# Patient Record
Sex: Male | Born: 1945
Health system: Southern US, Community
[De-identification: ages and names within clinical notes are randomized; demographics above are authoritative.]

## PROBLEM LIST (undated history)

## (undated) DIAGNOSIS — I2510 Atherosclerotic heart disease of native coronary artery without angina pectoris: Secondary | ICD-10-CM

## (undated) DIAGNOSIS — E785 Hyperlipidemia, unspecified: Secondary | ICD-10-CM

## (undated) DIAGNOSIS — G4730 Sleep apnea, unspecified: Secondary | ICD-10-CM

## (undated) DIAGNOSIS — F419 Anxiety disorder, unspecified: Secondary | ICD-10-CM

## (undated) DIAGNOSIS — I714 Abdominal aortic aneurysm, without rupture: Secondary | ICD-10-CM

## (undated) DIAGNOSIS — M1990 Unspecified osteoarthritis, unspecified site: Secondary | ICD-10-CM

## (undated) DIAGNOSIS — E669 Obesity, unspecified: Secondary | ICD-10-CM

## (undated) DIAGNOSIS — I252 Old myocardial infarction: Secondary | ICD-10-CM

## (undated) DIAGNOSIS — I739 Peripheral vascular disease, unspecified: Secondary | ICD-10-CM

## (undated) DIAGNOSIS — IMO0002 Reserved for concepts with insufficient information to code with codable children: Secondary | ICD-10-CM

## (undated) DIAGNOSIS — N529 Male erectile dysfunction, unspecified: Secondary | ICD-10-CM

## (undated) DIAGNOSIS — I10 Essential (primary) hypertension: Secondary | ICD-10-CM

## (undated) DIAGNOSIS — K219 Gastro-esophageal reflux disease without esophagitis: Secondary | ICD-10-CM

## (undated) DIAGNOSIS — R739 Hyperglycemia, unspecified: Secondary | ICD-10-CM

## (undated) DIAGNOSIS — R351 Nocturia: Secondary | ICD-10-CM

## (undated) HISTORY — DX: Hyperglycemia, unspecified: R73.9

## (undated) HISTORY — DX: Male erectile dysfunction, unspecified: N52.9

## (undated) HISTORY — DX: Anxiety disorder, unspecified: F41.9

## (undated) HISTORY — DX: Obesity, unspecified: E66.9

## (undated) HISTORY — PX: TONSILLECTOMY: SUR1361

## (undated) HISTORY — PX: CORONARY ARTERY BYPASS GRAFT: SHX141

## (undated) HISTORY — DX: Reserved for concepts with insufficient information to code with codable children: IMO0002

## (undated) HISTORY — PX: COLONOSCOPY: SHX174

## (undated) HISTORY — DX: Nocturia: R35.1

## (undated) HISTORY — DX: Essential (primary) hypertension: I10

## (undated) HISTORY — DX: Hyperlipidemia, unspecified: E78.5

## (undated) HISTORY — PX: UPPER GASTROINTESTINAL ENDOSCOPY: SHX188

## (undated) HISTORY — DX: Unspecified osteoarthritis, unspecified site: M19.90

## (undated) HISTORY — DX: Sleep apnea, unspecified: G47.30

## (undated) HISTORY — DX: Gastro-esophageal reflux disease without esophagitis: K21.9

## (undated) HISTORY — DX: Atherosclerotic heart disease of native coronary artery without angina pectoris: I25.10

## (undated) HISTORY — DX: Old myocardial infarction: I25.2

## (undated) HISTORY — DX: Peripheral vascular disease, unspecified: I73.9

## (undated) HISTORY — DX: Abdominal aortic aneurysm, without rupture: I71.4

## (undated) HISTORY — PX: OTHER SURGICAL HISTORY: SHX169

---

## 1998-11-11 ENCOUNTER — Ambulatory Visit (HOSPITAL_COMMUNITY): Admission: RE | Admit: 1998-11-11 | Discharge: 1998-11-11 | Payer: Self-pay | Admitting: Gastroenterology

## 2000-01-19 ENCOUNTER — Encounter (INDEPENDENT_AMBULATORY_CARE_PROVIDER_SITE_OTHER): Payer: Self-pay

## 2000-01-19 ENCOUNTER — Ambulatory Visit (HOSPITAL_COMMUNITY): Admission: RE | Admit: 2000-01-19 | Discharge: 2000-01-19 | Payer: Self-pay | Admitting: Gastroenterology

## 2000-10-19 ENCOUNTER — Ambulatory Visit (HOSPITAL_COMMUNITY): Admission: RE | Admit: 2000-10-19 | Discharge: 2000-10-19 | Payer: Self-pay | Admitting: Cardiology

## 2002-01-23 ENCOUNTER — Encounter (INDEPENDENT_AMBULATORY_CARE_PROVIDER_SITE_OTHER): Payer: Self-pay | Admitting: Specialist

## 2002-01-23 ENCOUNTER — Ambulatory Visit (HOSPITAL_COMMUNITY): Admission: RE | Admit: 2002-01-23 | Discharge: 2002-01-23 | Payer: Self-pay | Admitting: Gastroenterology

## 2004-05-20 ENCOUNTER — Ambulatory Visit: Payer: Self-pay | Admitting: Internal Medicine

## 2004-05-27 ENCOUNTER — Ambulatory Visit: Payer: Self-pay | Admitting: Internal Medicine

## 2004-06-04 ENCOUNTER — Encounter: Admission: RE | Admit: 2004-06-04 | Discharge: 2004-06-04 | Payer: Self-pay | Admitting: Internal Medicine

## 2004-06-11 ENCOUNTER — Ambulatory Visit: Payer: Self-pay

## 2004-07-07 ENCOUNTER — Ambulatory Visit: Payer: Self-pay | Admitting: *Deleted

## 2004-07-15 ENCOUNTER — Ambulatory Visit: Payer: Self-pay

## 2004-09-23 ENCOUNTER — Ambulatory Visit: Payer: Self-pay | Admitting: *Deleted

## 2005-05-13 ENCOUNTER — Ambulatory Visit: Payer: Self-pay | Admitting: Internal Medicine

## 2005-05-19 ENCOUNTER — Ambulatory Visit: Payer: Self-pay | Admitting: Endocrinology

## 2005-05-31 ENCOUNTER — Ambulatory Visit (HOSPITAL_COMMUNITY): Admission: RE | Admit: 2005-05-31 | Discharge: 2005-05-31 | Payer: Self-pay | Admitting: Internal Medicine

## 2005-05-31 ENCOUNTER — Ambulatory Visit: Payer: Self-pay | Admitting: Internal Medicine

## 2005-06-10 ENCOUNTER — Ambulatory Visit (HOSPITAL_COMMUNITY): Admission: RE | Admit: 2005-06-10 | Discharge: 2005-06-10 | Payer: Self-pay | Admitting: Internal Medicine

## 2005-12-10 ENCOUNTER — Ambulatory Visit: Payer: Self-pay | Admitting: Internal Medicine

## 2006-02-03 ENCOUNTER — Ambulatory Visit: Payer: Self-pay | Admitting: Internal Medicine

## 2006-10-20 ENCOUNTER — Ambulatory Visit: Payer: Self-pay | Admitting: *Deleted

## 2006-10-20 LAB — CONVERTED CEMR LAB
ALT: 66 units/L — ABNORMAL HIGH (ref 0–40)
Alkaline Phosphatase: 59 units/L (ref 39–117)
Basophils Relative: 0.2 % (ref 0.0–1.0)
Bilirubin, Direct: 0.1 mg/dL (ref 0.0–0.3)
CO2: 31 meq/L (ref 19–32)
Calcium: 8.8 mg/dL (ref 8.4–10.5)
Creatinine, Ser: 1 mg/dL (ref 0.4–1.5)
Eosinophils Relative: 4.5 % (ref 0.0–5.0)
GFR calc Af Amer: 97 mL/min
Glucose, Bld: 108 mg/dL — ABNORMAL HIGH (ref 70–99)
Hemoglobin: 15.4 g/dL (ref 13.0–17.0)
LDL Cholesterol: 62 mg/dL (ref 0–99)
Lymphocytes Relative: 45.5 % (ref 12.0–46.0)
Monocytes Absolute: 0.4 10*3/uL (ref 0.2–0.7)
Neutro Abs: 1.3 10*3/uL — ABNORMAL LOW (ref 1.4–7.7)
Platelets: 99 10*3/uL — ABNORMAL LOW (ref 150–400)
RDW: 12.6 % (ref 11.5–14.6)
Total Bilirubin: 0.8 mg/dL (ref 0.3–1.2)
Total Protein: 6.4 g/dL (ref 6.0–8.3)
VLDL: 13 mg/dL (ref 0–40)
WBC: 3.3 10*3/uL — ABNORMAL LOW (ref 4.5–10.5)

## 2006-10-25 ENCOUNTER — Ambulatory Visit: Payer: Self-pay | Admitting: *Deleted

## 2007-01-04 ENCOUNTER — Ambulatory Visit: Payer: Self-pay | Admitting: *Deleted

## 2007-01-04 ENCOUNTER — Ambulatory Visit: Payer: Self-pay

## 2007-01-04 LAB — CONVERTED CEMR LAB
ALT: 39 units/L (ref 0–53)
AST: 37 units/L (ref 0–37)
Albumin: 4.1 g/dL (ref 3.5–5.2)
Alkaline Phosphatase: 68 units/L (ref 39–117)
Total Bilirubin: 0.8 mg/dL (ref 0.3–1.2)
Triglycerides: 85 mg/dL (ref 0–149)
VLDL: 17 mg/dL (ref 0–40)

## 2007-01-11 ENCOUNTER — Ambulatory Visit: Payer: Self-pay

## 2007-01-12 ENCOUNTER — Ambulatory Visit: Payer: Self-pay | Admitting: Internal Medicine

## 2007-01-25 ENCOUNTER — Ambulatory Visit: Payer: Self-pay | Admitting: Internal Medicine

## 2007-01-27 ENCOUNTER — Ambulatory Visit: Payer: Self-pay

## 2007-01-27 ENCOUNTER — Ambulatory Visit: Payer: Self-pay | Admitting: Cardiovascular Disease

## 2007-04-26 ENCOUNTER — Ambulatory Visit: Payer: Self-pay | Admitting: Cardiology

## 2007-08-24 ENCOUNTER — Ambulatory Visit: Payer: Self-pay | Admitting: Cardiovascular Disease

## 2007-10-25 ENCOUNTER — Encounter: Payer: Self-pay | Admitting: Internal Medicine

## 2007-12-29 ENCOUNTER — Ambulatory Visit (HOSPITAL_COMMUNITY): Admission: RE | Admit: 2007-12-29 | Discharge: 2007-12-29 | Payer: Self-pay | Admitting: Internal Medicine

## 2008-01-02 ENCOUNTER — Ambulatory Visit: Payer: Self-pay

## 2008-05-08 ENCOUNTER — Ambulatory Visit: Payer: Self-pay | Admitting: Cardiology

## 2008-05-08 LAB — CONVERTED CEMR LAB
AST: 34 units/L (ref 0–37)
Albumin: 4 g/dL (ref 3.5–5.2)
Alkaline Phosphatase: 69 units/L (ref 39–117)
BUN: 18 mg/dL (ref 6–23)
Basophils Absolute: 0 10*3/uL (ref 0.0–0.1)
Chloride: 106 meq/L (ref 96–112)
Eosinophils Absolute: 0.3 10*3/uL (ref 0.0–0.7)
Eosinophils Relative: 3.5 % (ref 0.0–5.0)
GFR calc Af Amer: 87 mL/min
GFR calc non Af Amer: 72 mL/min
HCT: 46.9 % (ref 39.0–52.0)
MCHC: 34.7 g/dL (ref 30.0–36.0)
MCV: 88 fL (ref 78.0–100.0)
Monocytes Absolute: 0.5 10*3/uL (ref 0.1–1.0)
Neutrophils Relative %: 56 % (ref 43.0–77.0)
Platelets: 137 10*3/uL — ABNORMAL LOW (ref 150–400)
Potassium: 4.6 meq/L (ref 3.5–5.1)
RDW: 13.3 % (ref 11.5–14.6)
Sodium: 141 meq/L (ref 135–145)
Total Bilirubin: 0.9 mg/dL (ref 0.3–1.2)
WBC: 7.7 10*3/uL (ref 4.5–10.5)

## 2008-05-16 ENCOUNTER — Ambulatory Visit: Payer: Self-pay | Admitting: Cardiology

## 2008-05-16 LAB — CONVERTED CEMR LAB
BUN: 19 mg/dL (ref 6–23)
Basophils Relative: 0.6 % (ref 0.0–3.0)
CO2: 28 meq/L (ref 19–32)
Calcium: 9.1 mg/dL (ref 8.4–10.5)
Chloride: 105 meq/L (ref 96–112)
Creatinine, Ser: 0.9 mg/dL (ref 0.4–1.5)
Eosinophils Relative: 3.5 % (ref 0.0–5.0)
Hemoglobin: 15.6 g/dL (ref 13.0–17.0)
Lymphocytes Relative: 30.6 % (ref 12.0–46.0)
MCV: 87.5 fL (ref 78.0–100.0)
Neutro Abs: 4.7 10*3/uL (ref 1.4–7.7)
Neutrophils Relative %: 57.3 % (ref 43.0–77.0)
RBC: 5.17 M/uL (ref 4.22–5.81)
WBC: 8.2 10*3/uL (ref 4.5–10.5)

## 2008-05-24 ENCOUNTER — Inpatient Hospital Stay (HOSPITAL_BASED_OUTPATIENT_CLINIC_OR_DEPARTMENT_OTHER): Admission: RE | Admit: 2008-05-24 | Discharge: 2008-05-24 | Payer: Self-pay | Admitting: Cardiology

## 2008-05-24 ENCOUNTER — Ambulatory Visit: Payer: Self-pay | Admitting: Cardiology

## 2008-06-06 ENCOUNTER — Ambulatory Visit: Payer: Self-pay | Admitting: Cardiology

## 2008-08-15 ENCOUNTER — Ambulatory Visit: Payer: Self-pay | Admitting: Cardiovascular Disease

## 2008-10-09 ENCOUNTER — Telehealth (INDEPENDENT_AMBULATORY_CARE_PROVIDER_SITE_OTHER): Payer: Self-pay | Admitting: *Deleted

## 2008-10-30 ENCOUNTER — Encounter: Payer: Self-pay | Admitting: Cardiovascular Disease

## 2008-10-30 ENCOUNTER — Encounter (INDEPENDENT_AMBULATORY_CARE_PROVIDER_SITE_OTHER): Payer: Self-pay | Admitting: *Deleted

## 2008-10-30 ENCOUNTER — Ambulatory Visit: Payer: Self-pay

## 2008-11-01 ENCOUNTER — Telehealth: Payer: Self-pay | Admitting: Internal Medicine

## 2008-11-04 ENCOUNTER — Ambulatory Visit: Payer: Self-pay | Admitting: Internal Medicine

## 2008-11-04 LAB — CONVERTED CEMR LAB
AST: 38 units/L — ABNORMAL HIGH (ref 0–37)
Albumin: 3.9 g/dL (ref 3.5–5.2)
Alkaline Phosphatase: 73 units/L (ref 39–117)
Basophils Absolute: 0 10*3/uL (ref 0.0–0.1)
Basophils Relative: 0.1 % (ref 0.0–3.0)
Calcium: 9.3 mg/dL (ref 8.4–10.5)
Eosinophils Relative: 2.4 % (ref 0.0–5.0)
GFR calc non Af Amer: 71.62 mL/min (ref >60)
HDL: 38.1 mg/dL — ABNORMAL LOW (ref >39.00)
Hemoglobin, Urine: NEGATIVE
Hemoglobin: 15.5 g/dL (ref 13.0–17.0)
LDL Cholesterol: 77 mg/dL (ref 0–99)
Lymphocytes Relative: 37.4 % (ref 12.0–46.0)
Monocytes Relative: 8 % (ref 3.0–12.0)
Neutro Abs: 3.9 10*3/uL (ref 1.4–7.7)
RBC: 5.21 M/uL (ref 4.22–5.81)
RDW: 13.6 % (ref 11.5–14.6)
Sodium: 142 meq/L (ref 135–145)
Total CHOL/HDL Ratio: 3
Total Protein, Urine: NEGATIVE mg/dL
Urine Glucose: NEGATIVE mg/dL
VLDL: 14.6 mg/dL (ref 0.0–40.0)
WBC: 7.5 10*3/uL (ref 4.5–10.5)

## 2008-11-11 ENCOUNTER — Ambulatory Visit: Payer: Self-pay | Admitting: Internal Medicine

## 2008-11-12 DIAGNOSIS — I714 Abdominal aortic aneurysm, without rupture, unspecified: Secondary | ICD-10-CM

## 2008-11-12 DIAGNOSIS — E785 Hyperlipidemia, unspecified: Secondary | ICD-10-CM

## 2008-11-12 DIAGNOSIS — R351 Nocturia: Secondary | ICD-10-CM | POA: Insufficient documentation

## 2008-11-12 DIAGNOSIS — I739 Peripheral vascular disease, unspecified: Secondary | ICD-10-CM

## 2008-11-12 DIAGNOSIS — E663 Overweight: Secondary | ICD-10-CM

## 2008-11-12 DIAGNOSIS — IMO0002 Reserved for concepts with insufficient information to code with codable children: Secondary | ICD-10-CM

## 2008-11-12 DIAGNOSIS — K219 Gastro-esophageal reflux disease without esophagitis: Secondary | ICD-10-CM

## 2008-11-12 HISTORY — DX: Abdominal aortic aneurysm, without rupture: I71.4

## 2008-11-12 HISTORY — DX: Abdominal aortic aneurysm, without rupture, unspecified: I71.40

## 2008-11-18 DIAGNOSIS — Z9089 Acquired absence of other organs: Secondary | ICD-10-CM

## 2008-11-18 DIAGNOSIS — F528 Other sexual dysfunction not due to a substance or known physiological condition: Secondary | ICD-10-CM | POA: Insufficient documentation

## 2008-11-18 DIAGNOSIS — I10 Essential (primary) hypertension: Secondary | ICD-10-CM

## 2008-11-18 DIAGNOSIS — Z951 Presence of aortocoronary bypass graft: Secondary | ICD-10-CM | POA: Insufficient documentation

## 2008-11-18 DIAGNOSIS — I252 Old myocardial infarction: Secondary | ICD-10-CM

## 2008-11-19 ENCOUNTER — Ambulatory Visit: Payer: Self-pay | Admitting: Cardiovascular Disease

## 2009-08-26 ENCOUNTER — Telehealth (INDEPENDENT_AMBULATORY_CARE_PROVIDER_SITE_OTHER): Payer: Self-pay | Admitting: *Deleted

## 2009-09-03 ENCOUNTER — Telehealth: Payer: Self-pay | Admitting: Cardiovascular Disease

## 2009-09-05 ENCOUNTER — Encounter (INDEPENDENT_AMBULATORY_CARE_PROVIDER_SITE_OTHER): Payer: Self-pay | Admitting: *Deleted

## 2009-09-12 ENCOUNTER — Encounter: Payer: Self-pay | Admitting: Cardiovascular Disease

## 2009-10-15 ENCOUNTER — Telehealth (INDEPENDENT_AMBULATORY_CARE_PROVIDER_SITE_OTHER): Payer: Self-pay | Admitting: *Deleted

## 2009-10-30 ENCOUNTER — Encounter: Payer: Self-pay | Admitting: Cardiovascular Disease

## 2009-10-31 ENCOUNTER — Ambulatory Visit: Payer: Self-pay

## 2009-10-31 ENCOUNTER — Encounter: Payer: Self-pay | Admitting: Cardiovascular Disease

## 2009-11-11 ENCOUNTER — Ambulatory Visit: Payer: Self-pay | Admitting: Cardiovascular Disease

## 2009-11-13 ENCOUNTER — Ambulatory Visit: Payer: Self-pay | Admitting: Internal Medicine

## 2009-11-13 LAB — CONVERTED CEMR LAB
Albumin: 4.4 g/dL (ref 3.5–5.2)
CO2: 29 meq/L (ref 19–32)
Eosinophils Relative: 3.1 % (ref 0.0–5.0)
Glucose, Bld: 105 mg/dL — ABNORMAL HIGH (ref 70–99)
HCT: 46.6 % (ref 39.0–52.0)
HDL: 40.8 mg/dL (ref >39.00)
Hemoglobin: 16 g/dL (ref 13.0–17.0)
LDL Cholesterol: 88 mg/dL (ref 0–99)
Lymphs Abs: 2.6 10*3/uL (ref 0.7–4.0)
Monocytes Relative: 8.1 % (ref 3.0–12.0)
Neutro Abs: 3.9 10*3/uL (ref 1.4–7.7)
PSA: 0.47 ng/mL (ref 0.10–4.00)
Potassium: 4.6 meq/L (ref 3.5–5.1)
Sodium: 140 meq/L (ref 135–145)
Total CHOL/HDL Ratio: 4
Triglycerides: 86 mg/dL (ref 0.0–149.0)
WBC: 7.3 10*3/uL (ref 4.5–10.5)

## 2009-11-20 ENCOUNTER — Telehealth (INDEPENDENT_AMBULATORY_CARE_PROVIDER_SITE_OTHER): Payer: Self-pay | Admitting: *Deleted

## 2009-11-24 ENCOUNTER — Encounter (INDEPENDENT_AMBULATORY_CARE_PROVIDER_SITE_OTHER): Payer: Self-pay | Admitting: *Deleted

## 2009-11-24 ENCOUNTER — Ambulatory Visit: Payer: Self-pay | Admitting: Cardiology

## 2009-11-24 ENCOUNTER — Encounter: Payer: Self-pay | Admitting: Cardiovascular Disease

## 2009-11-24 ENCOUNTER — Telehealth: Payer: Self-pay | Admitting: Internal Medicine

## 2009-11-24 ENCOUNTER — Encounter (HOSPITAL_COMMUNITY): Admission: RE | Admit: 2009-11-24 | Discharge: 2010-02-04 | Payer: Self-pay | Admitting: Cardiovascular Disease

## 2009-11-24 ENCOUNTER — Ambulatory Visit: Payer: Self-pay

## 2009-11-25 ENCOUNTER — Encounter: Payer: Self-pay | Admitting: Cardiology

## 2009-11-25 ENCOUNTER — Encounter: Payer: Self-pay | Admitting: Internal Medicine

## 2009-11-27 ENCOUNTER — Ambulatory Visit: Payer: Self-pay | Admitting: Cardiology

## 2009-12-02 ENCOUNTER — Encounter: Payer: Self-pay | Admitting: Internal Medicine

## 2009-12-03 ENCOUNTER — Telehealth: Payer: Self-pay | Admitting: Internal Medicine

## 2009-12-23 ENCOUNTER — Encounter: Payer: Self-pay | Admitting: Cardiovascular Disease

## 2009-12-23 ENCOUNTER — Ambulatory Visit (HOSPITAL_BASED_OUTPATIENT_CLINIC_OR_DEPARTMENT_OTHER): Admission: RE | Admit: 2009-12-23 | Discharge: 2009-12-23 | Payer: Self-pay | Admitting: Cardiovascular Disease

## 2009-12-28 ENCOUNTER — Ambulatory Visit: Payer: Self-pay | Admitting: Pulmonary Disease

## 2010-01-12 ENCOUNTER — Telehealth: Payer: Self-pay | Admitting: Cardiovascular Disease

## 2010-02-02 ENCOUNTER — Ambulatory Visit: Payer: Self-pay | Admitting: Pulmonary Disease

## 2010-02-02 DIAGNOSIS — G4733 Obstructive sleep apnea (adult) (pediatric): Secondary | ICD-10-CM

## 2010-04-15 ENCOUNTER — Encounter: Payer: Self-pay | Admitting: Internal Medicine

## 2010-04-24 ENCOUNTER — Encounter: Payer: Self-pay | Admitting: Internal Medicine

## 2010-05-20 ENCOUNTER — Telehealth: Payer: Self-pay | Admitting: Cardiovascular Disease

## 2010-06-09 ENCOUNTER — Encounter: Payer: Self-pay | Admitting: Internal Medicine

## 2010-06-17 ENCOUNTER — Ambulatory Visit: Payer: Self-pay | Admitting: Cardiovascular Disease

## 2010-06-17 LAB — CONVERTED CEMR LAB
ALT: 29 units/L (ref 0–53)
Albumin: 3.9 g/dL (ref 3.5–5.2)
GFR calc non Af Amer: 73.57 mL/min (ref >60.00)
Glucose, Bld: 113 mg/dL — ABNORMAL HIGH (ref 70–99)
HDL: 33.1 mg/dL — ABNORMAL LOW (ref >39.00)
Potassium: 4.1 meq/L (ref 3.5–5.1)
Sodium: 141 meq/L (ref 135–145)
Total Bilirubin: 0.5 mg/dL (ref 0.3–1.2)
Total CHOL/HDL Ratio: 5
VLDL: 15.2 mg/dL (ref 0.0–40.0)

## 2010-06-24 ENCOUNTER — Ambulatory Visit: Payer: Self-pay | Admitting: Cardiovascular Disease

## 2010-06-24 ENCOUNTER — Encounter: Payer: Self-pay | Admitting: Cardiovascular Disease

## 2010-07-02 ENCOUNTER — Ambulatory Visit: Payer: Self-pay | Admitting: Cardiology

## 2010-07-09 ENCOUNTER — Telehealth: Payer: Self-pay | Admitting: Internal Medicine

## 2010-07-09 ENCOUNTER — Telehealth (INDEPENDENT_AMBULATORY_CARE_PROVIDER_SITE_OTHER): Payer: Self-pay | Admitting: *Deleted

## 2010-07-13 ENCOUNTER — Encounter (INDEPENDENT_AMBULATORY_CARE_PROVIDER_SITE_OTHER): Payer: Self-pay | Admitting: *Deleted

## 2010-07-16 ENCOUNTER — Encounter: Payer: Self-pay | Admitting: Cardiovascular Disease

## 2010-07-18 DIAGNOSIS — I2510 Atherosclerotic heart disease of native coronary artery without angina pectoris: Secondary | ICD-10-CM | POA: Insufficient documentation

## 2010-07-25 ENCOUNTER — Encounter: Payer: Self-pay | Admitting: Internal Medicine

## 2010-08-04 NOTE — Letter (Signed)
Summary: Outpatient Coinsurance Notice  Outpatient Coinsurance Notice   Imported By: Marylou Mccoy 11/25/2009 10:07:33  _____________________________________________________________________  External Attachment:    Type:   Image     Comment:   External Document

## 2010-08-04 NOTE — Miscellaneous (Signed)
Summary: Orders Update  Clinical Lists Changes  Orders: Added new Test order of Arterial Duplex Lower Extremity (Arterial Duplex Low) - Signed 

## 2010-08-04 NOTE — Assessment & Plan Note (Signed)
Summary: f1y   Visit Type:  Follow-up Primary Provider:  Jacques Navy MD   History of Present Illness: Patient is 65 years old and return for management of CAD. He had a heart attack at age 5. In 1987 he underwent bypass surgery. In 2090 and a catheterization which showed total occlusion of the vein graft to the right coronary with normal LV function.  He's been doing fairly well but he saw Dr. Excell Seltzer recently for peripheral vascular disease and indicated as having some left-sided chest pain. Chest pain was somewhat atypical but Dr. Excell Seltzer arrangement have a Myoview scan. This showed some mild infero--basal ischemia which had not changed from his previous scan in 2008. His ejection fraction was 66%.  He also has peripheral vascular disease with a small distal aorta and aortoiliac disease. He recently had a stress test for indices of his lower extremity with stress and this is still pending. He is also had lumbar disc problems which makes it somewhat difficult to interpret his symptoms.  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  We will plan to transition his cardiology care in addition to his peripheral vascular disease care to Dr. Excell Seltzer following my retirement at the end of the year.  Current Medications (verified): 1)  Lipitor 80 Mg Tabs (Atorvastatin Calcium) .... Take One Tablet By Mouth Daily. 2)  Zetia 10 Mg Tabs (Ezetimibe) .... Take One Tablet By Mouth Daily. 3)  Cialis 10 Mg Tabs (Tadalafil) .... Take 1 Tablet By Mouth As Directed 4)  Cozaar 100 Mg Tabs (Losartan Potassium) .... Take 1/3 Tablet Daily 5)  Metoprolol Succinate 100 Mg Xr24h-Tab (Metoprolol Succinate) .... Take 1/2 Tablet Daily 6)  Plavix 75 Mg Tabs (Clopidogrel Bisulfate) .... Take One Tablet By Mouth Once Daily. 7)  Omeprazole 20 Mg Cpdr (Omeprazole) .... Take 1 Tablet By Mouth Every Day 8)  Alprazolam 0.5 Mg Tabs (Alprazolam) .Marland Kitchen.. 1 By Mouth  Q 8 As Needed 9)  Vitamin-B Complex  Tabs (B Complex Vitamins) .... Take 1 Tablet By Mouth Once A Day 10)  Vitamin D 2000 Units .... Take 1 Tablet By Mouth Once A Day 11)  Calcium-Magnesium 500-250 Mg Tabs (Calcium-Magnesium) .... Take 1 Tablet By Mouth Once A Day 12)  Niaspan 500 Mg Cr-Tabs (Niacin (Antihyperlipidemic)) .... Take 4 Tablets At Bedtime 13)  Aspirin 81 Mg Tbec (Aspirin) .... Take One Tablet By Mouth Daily  Allergies (verified): No Known Drug Allergies  Past History:  Past Medical History: Reviewed history from 11/18/2008 and no changes required. MYOCARDIAL INFARCTION, HX OF (ICD-412) HYPERTENSION (ICD-401.9) ERECTILE DYSFUNCTION (ICD-302.72) HYPERGLYCEMIA, BORDERLINE (ICD-790.29) DEGENERATIVE DISC DISEASE (ICD-722.6) OBESITY, CLASS I (ICD-278.02) LOW BACK PAIN, CHRONIC (ICD-724.2) ABDOMINAL AORTIC ANEURYSM (ICD-441.4) PERIPHERAL VASCULAR DISEASE (ICD-443.9) NOCTURIA (ICD-788.43) GERD (ICD-530.81) HYPERLIPIDEMIA, ATHEROGENIC (ICD-272.4) CORONARY ARTERY DISEASE, PREMATURE (ICD-429.9)    Review of Systems       ROS is negative except as outlined in HPI.   Vital Signs:  Patient profile:   65 year old male Height:      66 inches Weight:      190 pounds BMI:     30.78 Pulse rate:   76 / minute BP sitting:   136 / 83  (left arm) Cuff size:   regular  Vitals Entered By: Burnett Kanaris, CNA (Nov 27, 2009 9:03 AM)   Physical Exam  Additional Exam:  Gen. Well-nourished, in no distress   Neck: No  JVD, thyroid not enlarged, no carotid bruits Lungs: No tachypnea, clear without rales, rhonchi or wheezes Cardiovascular: Rhythm regular, PMI not displaced,  heart sounds  normal, no murmurs or gallops, no peripheral edema, Abdomen: BS normal, abdomen soft and non-tender without masses or organomegaly, no hepatosplenomegaly. MS: No deformities, no cyanosis or clubbing   Neuro:  No focal sns   Skin:  no lesions    Impression & Recommendations:  Problem # 1:   CORONARY ARTERY BYPASS GRAFT, HX OF (ICD-V45.81) He had previous bypass surgery and has known occlusion of one graft to the right coronary artery. He had a Myoview scan recently which showed some mild infero--facial ischemia which had not changed. This is consistent with his known total occlusion of the graft to the right coronary artery. His chest pain is atypical and I think it's not likely angina. Overall I think he is doing very well with his heart and we will continue current therapy.  Problem # 2:  PERIPHERAL VASCULAR DISEASE (ICD-443.9) he has aortoiliac disease and has hip and buttock pain with exercise. His exercise peripheral vascular indices are pending and Dr. Excell Seltzer will followup regarding this.  Problem # 3:  HYPERLIPIDEMIA, ATHEROGENIC (ICD-272.4) His last lipid profile was quite good on current medications. We'll continue his current medications. His updated medication list for this problem includes:    Lipitor 80 Mg Tabs (Atorvastatin calcium) .Marland Kitchen... Take one tablet by mouth daily.    Zetia 10 Mg Tabs (Ezetimibe) .Marland Kitchen... Take one tablet by mouth daily.    Niaspan 500 Mg Cr-tabs (Niacin (antihyperlipidemic)) .Marland Kitchen... Take 4 tablets at bedtime  Patient Instructions: 1)  Your physician recommends that you continue on your current medications as directed. Please refer to the Current Medication list given to you today. 2)  Your physician wants you to follow-up in: November with Dr. Excell Seltzer.  You will receive a reminder letter in the mail two months in advance. If you don't receive a letter, please call our office to schedule the follow-up appointment. Prescriptions: ZETIA 10 MG TABS (EZETIMIBE) Take one tablet by mouth daily.  #28 x 0   Entered by:   Sherri Rad, RN, BSN   Authorized by:   Lenoria Farrier, MD, [facility]   Signed by:   Lenoria Farrier, MD, [facility] on 11/27/2009   Method used:   Samples Given   RxID:   [id number]

## 2010-08-04 NOTE — Letter (Signed)
Summary: [facility]   Imported By: Sherian Rein 06/03/2010 09:56:10  _____________________________________________________________________  External Attachment:    Type:   Image     Comment:   External Document

## 2010-08-04 NOTE — Assessment & Plan Note (Signed)
Summary: Joshua Brown   Visit Type:  Follow-up Primary Provider:  Jacques Navy MD  CC:  soreness or tenderness in heart area.  History of Present Illness: 65 year-old male presents for follow-up of lower extremity PAD. He is followed for moderate aortoiliac disease and intermittent claudication. He complains of buttock pain with ambulation but does not have calf or thigh discomfort. He complains of generalized fatigue and tiredness. He also complains of intermittent left sided chest pains, nonradiating and unrelated to exertion. Denies dyspnea, edema, or other complaints.  Current Medications (verified): 1)  Lipitor 80 Mg Tabs (Atorvastatin Calcium) .... Take One Tablet By Mouth Daily. 2)  Zetia 10 Mg Tabs (Ezetimibe) .... Take One Tablet By Mouth Daily. 3)  Cialis 10 Mg Tabs (Tadalafil) .... Take 1 Tablet By Mouth As Directed 4)  Cozaar 100 Mg Tabs (Losartan Potassium) .... Take 1/3 Tablet Daily 5)  Metoprolol Succinate 100 Mg Xr24h-Tab (Metoprolol Succinate) .... Take 1/2 Tablet Daily 6)  Plavix 75 Mg Tabs (Clopidogrel Bisulfate) .... Take One Tablet By Mouth Once Daily. 7)  Omeprazole 20 Mg Cpdr (Omeprazole) .... Take 1 Tablet By Mouth Every Other Day 8)  Alprazolam 0.5 Mg Tabs (Alprazolam) .Marland Kitchen.. 1 By Mouth Q 8 As Needed 9)  Vitamin-B Complex  Tabs (B Complex Vitamins) .... Take 1 Tablet By Mouth Once A Day 10)  Vitamin D 1000 Unit  Tabs (Cholecalciferol) .... Take 1 Tablet By Mouth Once A Day 11)  Calcium-Magnesium 500-250 Mg Tabs (Calcium-Magnesium) .... Take 1 Tablet By Mouth Once A Day 12)  Folic Acid 800 Mcg Tabs (Folic Acid) .... Take 1 Tablet By Mouth Once A Day 13)  Niaspan 500 Mg Cr-Tabs (Niacin (Antihyperlipidemic)) .... Take 4 Tablets At Bedtime 14)  Aspirin 81 Mg Tbec (Aspirin) .... Take One Tablet By Mouth Daily  Allergies: No Known Drug Allergies  Past History:  Past medical history reviewed for relevance to current acute and chronic problems.  Past Medical  History: Reviewed history from 11/18/2008 and no changes required. MYOCARDIAL INFARCTION, HX OF (ICD-412) HYPERTENSION (ICD-401.9) ERECTILE DYSFUNCTION (ICD-302.72) HYPERGLYCEMIA, BORDERLINE (ICD-790.29) DEGENERATIVE DISC DISEASE (ICD-722.6) OBESITY, CLASS I (ICD-278.02) LOW BACK PAIN, CHRONIC (ICD-724.2) ABDOMINAL AORTIC ANEURYSM (ICD-441.4) PERIPHERAL VASCULAR DISEASE (ICD-443.9) NOCTURIA (ICD-788.43) GERD (ICD-530.81) HYPERLIPIDEMIA, ATHEROGENIC (ICD-272.4) CORONARY ARTERY DISEASE, PREMATURE (ICD-429.9)    Review of Systems       Negative except as per HPI   Vital Signs:  Patient profile:   65 year old male Height:      67 inches Weight:      189 pounds BMI:     29.71 Pulse rate:   76 / minute Pulse rhythm:   regular Resp:     16 per minute BP sitting:   118 / 72  (left arm)  Vitals Entered By: Jacquelin Hawking, CMA (Nov 11, 2009 8:37 AM)  Physical Exam  General:  Pt is alert and oriented, in no acute distress. HEENT: normal Neck: normal carotid upstrokes without bruits, JVP normal Lungs: CTA CV: RRR without murmur or gallop Abd: soft, NT, positive BS, no bruit, no organomegaly Ext: no clubbing, cyanosis, or edema. Femorals 2+ with bilateral bruits, PT pulses 2+, DP not palp Skin: warm and dry without rash    EKG  Procedure date:  11/11/2009  Findings:      NSR, within normal limits. HR 71 bpm.  Impression & Recommendations:  Problem # 1:  CORONARY ARTERY DISEASE, PREMATURE (ICD-429.9) Pt with known CAD, and new chest pains with both typical and  atypical features. Will check a Lexiscan Myoview to rule out significant ischemia. Otherwise continue current medical therapy.  Problem # 2:  PERIPHERAL VASCULAR DISEASE (ICD-443.9) Pt with moderately severe aortoiliac disease. His exertional leg symptoms are somewhat atypical. I think exercise ABI's will be helpful in determining the functional significance of his aortoiliac occlusive disease.  Problem # 3:   HYPERTENSION (ICD-401.9) well-controlled.  His updated medication list for this problem includes:    Cozaar 100 Mg Tabs (Losartan potassium) .Marland Kitchen... Take 1/3 tablet daily    Metoprolol Succinate 100 Mg Xr24h-tab (Metoprolol succinate) .Marland Kitchen... Take 1/2 tablet daily    Aspirin 81 Mg Tbec (Aspirin) .Marland Kitchen... Take one tablet by mouth daily  Orders: EKG w/ Interpretation (93000) Nuclear Stress Test (Nuc Stress Test) Arterial Duplex Lower Extremity (Arterial Duplex Low) Sleep Disorder Referral (Sleep Disorder)  BP today: 118/72 Prior BP: 112/72 (11/19/2008)  Labs Reviewed: K+: 4.2 (11/04/2008) Creat: : 1.1 (11/04/2008)   Chol: 130 (11/04/2008)   HDL: 38.10 (11/04/2008)   LDL: 77 (11/04/2008)   TG: 73.0 (11/04/2008)  Problem # 4:  HYPERSOMNIA UNSPECIFIED (ICD-780.54) will check a sleep study as he is at risk for OSA.  Patient Instructions: 1)  Your physician recommends that you continue on your current medications as directed. Please refer to the Current Medication list given to you today. 2)  Your physician wants you to follow-up in: 6 MONTHS.  You will receive a reminder letter in the mail two months in advance. If you don't receive a letter, please call our office to schedule the follow-up appointment. 3)  Your physician has requested that you have a lower extremity arterial exercise duplex.  During this test, exercise and ultrasound are used to evaluate arterial blood flow in the legs.  Allow one hour for this exam. There are no restrictions or special instructions. 4)  Your physician has requested that you have a lexiscan myoview.  For further information please visit [email or web address]  Please follow instruction sheet, as given. 5)  Your physician has recommended that you have a sleep study.  This test records several body functions during sleep, including:  brain activity, eye movement, oxygen and carbon dioxide blood levels, heart rate and rhythm, breathing rate and rhythm, the flow of air  through your mouth and nose, snoring, body muscle movements, and chest and belly movement.

## 2010-08-04 NOTE — Letter (Signed)
Summary: [facility]  [facility]   Imported By: Lester [place] 12/04/2009 10:27:28  _____________________________________________________________________  External Attachment:    Type:   Image     Comment:   External Document

## 2010-08-04 NOTE — Progress Notes (Signed)
Summary: refill meds  Phone Note Refill Request Call back at Home Phone [phone] Message from:  Patient on October 15, 2009 12:33 PM  Refills Requested: Medication #1:  OMEPRAZOLE 20 MG CPDR Take 1 tablet by mouth every other day   Supply Requested: 3 months  Medication #2:  METOPROLOL SUCCINATE 100 MG XR24H-TAB Take 1/2 tablet daily   Supply Requested: 3 months   Supply Requested: 3 months K+ 100 mg. [facility]  [phone]. 3 refills .    Method Requested: Fax to Local Pharmacy Initial call taken by: Lorne Skeens,  October 15, 2009 12:35 PM  Follow-up for Phone Call        Rx faxed to pharmacy Follow-up by: Vikki Ports,  October 16, 2009 12:29 PM    Prescriptions: OMEPRAZOLE 20 MG CPDR (OMEPRAZOLE) Take 1 tablet by mouth every other day  #90 x 0   Entered by:   Vikki Ports   Authorized by:   Norva Karvonen, MD   Signed by:   Vikki Ports on 10/16/2009   Method used:   Faxed to ...       [facility])             , [place]         Ph:        Fax: [phone]   RxID:   [id number] COZAAR 100 MG TABS (LOSARTAN POTASSIUM) Take 1/3 tablet daily  #90 x 0   Entered by:   Vikki Ports   Authorized by:   Norva Karvonen, MD   Signed by:   Vikki Ports on 10/16/2009   Method used:   Faxed to ...       [facility] (mail-order)             , [place]         Ph:        Fax: [phone]   RxID:   [id number] METOPROLOL SUCCINATE 100 MG XR24H-TAB (METOPROLOL SUCCINATE) Take 1/2 tablet daily  #45 x 3   Entered by:   Vikki Ports   Authorized by:   Norva Karvonen, MD   Signed by:   Vikki Ports on 10/16/2009   Method used:   Faxed to ...       [facility] (mail-order)             , [place]         Ph:        Fax: [phone]   RxID:   [id number]

## 2010-08-04 NOTE — Letter (Signed)
Summary: [facility]  [facility]   Imported By: Esmeralda Links D'jimraou 04/27/2010 10:46:03  _____________________________________________________________________  External Attachment:    Type:   Image     Comment:   External Document

## 2010-08-04 NOTE — Progress Notes (Signed)
Summary: Pulmonary Referral  Phone Note Outgoing Call   Call placed by: Julieta Gutting, RN, BSN,  January 12, 2010 5:53 PM Call placed to: Patient Summary of Call: I spoke with the pt and made him aware of his sleep study results.  Dr Excell Seltzer would like the pt to see pulmonary for further decision making about therapy.  I will place a referral to Pulmonary for the pt.  Initial call taken by: Julieta Gutting, RN, BSN,  January 12, 2010 5:53 PM

## 2010-08-04 NOTE — Assessment & Plan Note (Signed)
Summary: YEARLY---STC   Vital Signs:  Patient profile:   65 year old male Height:      67 inches Weight:      191 pounds BMI:     30.02 Temp:     98.2 degrees F oral BP sitting:   130 / 80  (left arm) Cuff size:   regular  Vitals Entered By: Bill Salinas CMA (Nov 13, 2009 10:09 AM) CC: pt here for yearly physical, he no longer takes folic acid and needs omeprazole and [facility]razolam [facility]/ pt has never had shigles vaccine/ ab  Vision Screening:      Vision Comments: Last Eye exam 10/2009 with Dr Emily Filbert. Pt states he had minor change in his prescription and that there was little pressure in one eye that Dr Emily Filbert will monitor.   Primary Care Provider:  Jacques Navy MD  CC:  pt here for yearly physical and he no longer takes folic acid and needs omeprazole and [facility]olam [facility]/ pt has never had shigles vaccine/ ab.  History of Present Illness: Patient presents for genral exam. He is "slowly falling apart."   1. Low back pain - needs physical therapy Rx. Chart reviewed: LS-spine '05 swith multi-level DJD, DDD; MRI hip with synovitis. He does take occasional Ibuprofen. He is limited in his ability to exericse but is independent in all his ADLs.  2. PAD: had LE arterial doppler - ABI 0.83; Aorta - small caliber with moderate to severe stenosis; moderate disease of bilateral iliacs.   3. ED- poor response to cialis - has an appointment with Dr. Patsi Sears.  4. Oral lesion - had a lesion on the hard palate which did drain and seems much improved.   5. Hyper-somnolence - scheduled for sleep study for May 29th: bed-partner reports snoring and breath holding.   6. Skin tags in the infra-orbital area.   Current Medications (verified): 1)  Lipitor 80 Mg Tabs (Atorvastatin Calcium) .... Take One Tablet By Mouth Daily. 2)  Zetia 10 Mg Tabs (Ezetimibe) .... Take One Tablet By Mouth Daily. 3)  Cialis 10 Mg Tabs (Tadalafil) .... Take 1 Tablet By Mouth As Directed 4)  Cozaar  100 Mg Tabs (Losartan Potassium) .... Take 1/3 Tablet Daily 5)  Metoprolol Succinate 100 Mg Xr24h-Tab (Metoprolol Succinate) .... Take 1/2 Tablet Daily 6)  Plavix 75 Mg Tabs (Clopidogrel Bisulfate) .... Take One Tablet By Mouth Once Daily. 7)  Omeprazole 20 Mg Cpdr (Omeprazole) .... Take 1 Tablet By Mouth Every Day 8)  Alprazolam 0.5 Mg Tabs (Alprazolam) .Marland Kitchen.. 1 By Mouth Q 8 As Needed 9)  Vitamin-B Complex  Tabs (B Complex Vitamins) .... Take 1 Tablet By Mouth Once A Day 10)  Vitamin D 1000 Unit  Tabs (Cholecalciferol) .... Take 1 Tablet By Mouth Once A Day 11)  Calcium-Magnesium 500-250 Mg Tabs (Calcium-Magnesium) .... Take 1 Tablet By Mouth Once A Day 12)  Folic Acid 800 Mcg Tabs (Folic Acid) .... Take 1 Tablet By Mouth Once A Day 13)  Niaspan 500 Mg Cr-Tabs (Niacin (Antihyperlipidemic)) .... Take 4 Tablets At Bedtime 14)  Aspirin 81 Mg Tbec (Aspirin) .... Take One Tablet By Mouth Daily  Allergies (verified): No Known Drug Allergies  Past History:  Past Medical History: Last updated: 11/18/2008 MYOCARDIAL INFARCTION, HX OF (ICD-412) HYPERTENSION (ICD-401.9) ERECTILE DYSFUNCTION (ICD-302.72) HYPERGLYCEMIA, BORDERLINE (ICD-790.29) DEGENERATIVE DISC DISEASE (ICD-722.6) OBESITY, CLASS I (ICD-278.02) LOW BACK PAIN, CHRONIC (ICD-724.2) ABDOMINAL AORTIC ANEURYSM (ICD-441.4) PERIPHERAL VASCULAR DISEASE (ICD-443.9) NOCTURIA (ICD-788.43) GERD (ICD-530.81) HYPERLIPIDEMIA,  ATHEROGENIC (ICD-272.4) CORONARY ARTERY DISEASE, PREMATURE (ICD-429.9)    Past Surgical History: Last updated: 11/18/2008 CORONARY ARTERY BYPASS GRAFT, HX OF (ICD-V45.81) TONSILLECTOMY, HX OF (ICD-V45.79)  Family History: Last updated: 11/20/08 father - deceased @ 44: CAD/MI mother - deceased @ 22: CHF, lipids PGF deceased @ 35: CAD/MI Brother - prostate cancer Neg - DM  Social History: Last updated: Nov 20, 2008 [employer] 1 year Married '77 1 son - '81; 2 daughters - '79, '87; 2 grandchildren 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 father - deceased @ 56: CAD/MI mother - deceased @ 83: CHF, lipids PGF deceased @ 77: CAD/MI Brother - prostate cancer Neg - DM  Social History: Reviewed history from 11/20/2008 and no changes required. 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    Physical Exam  General:  overweight swhite male Head:  normocephalic, atraumatic, and no abnormalities observed.   Eyes:  vision grossly intact, pupils equal, and pupils round.   Ears:  R ear normal and L ear normal.   Nose:  no external deformity and no external erythema.   Mouth:  tiny lesion hard palate left of mid-line. No other lesionsgood dentition, no gingival abnormalities, and pharynx pink and moist.   Neck:  supple, full ROM, no thyromegaly, and no carotid bruits.   Chest Wall:  no deformities and no tenderness.   Lungs:  normal respiratory effort, no intercostal retractions, no accessory muscle use, normal breath sounds, and no wheezes.   Heart:  normal rate, regular rhythm, no murmur, no gallop, and no JVD.   Abdomen:  soft, non-tender, normal bowel sounds, no masses, no guarding, and no hepatomegaly.   Prostate:  deferred to Dr. Patsi Sears Msk:  normal ROM, no joint tenderness, no joint swelling, and no joint warmth.   Pulses:  R radial normal and R femoral normal.   Extremities:  No  clubbing, cyanosis, edema, or deformity noted with normal full range of motion of all joints.   Neurologic:  alert & oriented X3, cranial nerves II-XII intact, strength normal in all extremities, sensation intact to light touch, gait normal, and DTRs symmetrical and normal.   Skin:  fatty tumor right upper back 10cm Cervical Nodes:  no anterior cervical adenopathy and no posterior cervical adenopathy.   Axillary Nodes:  no R axillary adenopathy and no L axillary adenopathy.   Psych:  Oriented X3, memory intact for recent and remote, normally interactive, and good eye contact.     Impression & Recommendations:  Problem # 1:  CORONARY ARTERY BYPASS GRAFT, HX OF (ICD-V45.81) Patient is stable with no complaint of chest pain or other cardiac symptoms. He does follow with cardiology.   Plan - continue present medications and risk reduction.   His updated medication list for this problem includes:    Cozaar 100 Mg Tabs (Losartan potassium) .Marland Kitchen... Take 1/3 tablet daily    Metoprolol Succinate 100 Mg Xr24h-tab (Metoprolol succinate) .Marland Kitchen... Take 1/2 tablet daily    Plavix  75 Mg Tabs (Clopidogrel bisulfate) .Marland Kitchen... Take one tablet by mouth once daily.    Aspirin 81 Mg Tbec (Aspirin) .Marland Kitchen... Take one tablet by mouth daily  Problem # 2:  HYPERTENSION (ICD-401.9)  His updated medication list for this problem includes:    Cozaar 100 Mg Tabs (Losartan potassium) .Marland Kitchen... Take 1/3 tablet daily    Metoprolol Succinate 100 Mg Xr24h-tab (Metoprolol succinate) .Marland Kitchen... Take 1/2 tablet daily  Orders: TLB-BMP (Basic Metabolic Panel-BMET) (80048-METABOL)  BP today: 130/80 Prior BP: 112/72 (11/19/2008)  Adequate control of blood pressure on present medications.   Problem # 3:  ERECTILE DYSFUNCTION (ICD-302.72) Failure to respond to standard drug therapy.  Plan - keep appointment with Dr. Patsi Sears.  His updated medication list for this problem includes:    Cialis 10 Mg Tabs (Tadalafil) .Marland Kitchen... Take 1 tablet by  mouth as directed  Orders: TLB-PSA (Prostate Specific Antigen) (84153-PSA)  Problem # 4:  HYPERGLYCEMIA, BORDERLINE (ICD-790.29) For A1C - recommendations to follow.   Orders: TLB-A1C / Hgb A1C (Glycohemoglobin) (83036-A1C)  Addendum - A1C 6.2% -does make the diagnosis of diabetes. However, he is at goal or better, i.e. A1C 7% or less, and can continue with life-style management.   Problem # 5:  DEGENERATIVE DISC DISEASE (ICD-722.6) On-going back pain. He is not limited in his usual ADLs. Exam is non-focal.   Plan - LS spine films - r/o DDD           Will benefit from Physical Therapy - he will call back with facility/PT practice of choice.   Orders: TLB-CBC Platelet - w/Differential (85025-CBCD) T-Lumbar Spine Complete, 5 Views (16109UE)  Addendum - multi-level Degenerative disk disease, however he does not have radicular findings, thus no surgical referral indicated.  Plan - back exercises and PT  Problem # 6:  PERIPHERAL VASCULAR DISEASE (ICD-443.9) Stable with minimal claudication type symptoms. Last arterial doppler with ABI 0.83. He has no non-healing ulcers.  Plan - continue all present medications           increased exercise, i.e. walking.   Problem # 7:  HYPERLIPIDEMIA, ATHEROGENIC (ICD-272.4) Due for follow-up lab with recommendations to follow. His updated medication list for this problem includes:    Lipitor 80 Mg Tabs (Atorvastatin calcium) .Marland Kitchen... Take one tablet by mouth daily.    Zetia 10 Mg Tabs (Ezetimibe) .Marland Kitchen... Take one tablet by mouth daily.    Niaspan 500 Mg Cr-tabs (Niacin (antihyperlipidemic)) .Marland Kitchen... Take 4 tablets at bedtime  Orders: TLB-Lipid Panel (80061-LIPID) TLB-Hepatic/Liver Function Pnl (80076-HEPATIC)  Addendum - LDL 88, not at goal of 80 or less.  Plan -continue present medications.          improve dietary adherence and exercise.  Problem # 8:  HYPERSOMNIA UNSPECIFIED (ICD-780.54) Patient's partner reports snoring and breath-holding  during sleep. He also has increased somnolence. Medical implications are significant.  Plan - patient is scheduled for sleep study.  Problem # 9:  Preventive Health Care (ICD-V70.0) Exam is good. Lab results are within normal limits except for glucose/A1C. PSA is OK. Need paper chart to check for colorectal cancer screening. He will need Pneumonia and shingle vaccine   In summary - a nice gentleman with multiple medical problems who does appear stable at today's visit. He will keep appts: Dr. Patsi Sears; sleep study; follow-up with cardiology.   Complete Medication List: 1)  Lipitor 80 Mg Tabs (Atorvastatin calcium) .... Take one tablet by mouth daily. 2)  Zetia 10 Mg Tabs (Ezetimibe) .... Take one tablet by  mouth daily. 3)  Cialis 10 Mg Tabs (Tadalafil) .... Take 1 tablet by mouth as directed 4)  Cozaar 100 Mg Tabs (Losartan potassium) .... Take 1/3 tablet daily 5)  Metoprolol Succinate 100 Mg Xr24h-tab (Metoprolol succinate) .... Take 1/2 tablet daily 6)  Plavix 75 Mg Tabs (Clopidogrel bisulfate) .... Take one tablet by mouth once daily. 7)  Omeprazole 20 Mg Cpdr (Omeprazole) .... Take 1 tablet by mouth every day 8)  Alprazolam 0.5 Mg Tabs (Alprazolam) .Marland Kitchen.. 1 by mouth q 8 as needed 9)  Vitamin-b Complex Tabs (B complex vitamins) .... Take 1 tablet by mouth once a day 10)  Vitamin D 1000 Unit Tabs (Cholecalciferol) .... Take 1 tablet by mouth once a day 11)  Calcium-magnesium 500-250 Mg Tabs (Calcium-magnesium) .... Take 1 tablet by mouth once a day 12)  Folic Acid 800 Mcg Tabs (Folic acid) .... Take 1 tablet by mouth once a day 13)  Niaspan 500 Mg Cr-tabs (Niacin (antihyperlipidemic)) .... Take 4 tablets at bedtime 14)  Aspirin 81 Mg Tbec (Aspirin) .... Take one tablet by mouth daily  Patient: Joshua Brown Note: All result statuses are Final unless otherwise noted.  Tests: (1) BMP (METABOL)   Sodium                    140 mEq/L                   135-145   Potassium                 4.6  mEq/L                   3.5-5.1   Chloride                  104 mEq/L                   96-112   Carbon Dioxide            29 mEq/L                    19-32   Glucose              [H]  105 mg/dL                   16-10   BUN                       17 mg/dL                    9-60   Creatinine                0.9 mg/dL                   4.5-4.0   Calcium                   9.4 mg/dL                   9.8-11.9   GFR                       89.99 mL/min                >60  Tests: (2) Hemoglobin A1C (A1C)   Hemoglobin A1C            6.2 %  4.6-6.5     Glycemic Control Guidelines for People with Diabetes:     Non Diabetic:  <6%     Goal of Therapy: <7%     Additional Action Suggested:  >8%   Tests: (3) Lipid Panel (LIPID)   Cholesterol               146 mg/dL                   1-191     ATP III Classification            Desirable:  < 200 mg/dL                    Borderline High:  200 - 239 mg/dL               High:  > = 240 mg/dL   Triglycerides             86.0 mg/dL                  4.7-829.5     Normal:  <150 mg/dL     Borderline High:  621 - 199 mg/dL   HDL                       30.86 mg/dL                 >57.84   VLDL Cholesterol          17.2 mg/dL                  6.9-62.9   LDL Cholesterol           88 mg/dL                    5-28  CHO/HDL Ratio:  CHD Risk                             4                    Men          Women     1/2 Average Risk     3.4          3.3     Average Risk          5.0          4.4     2X Average Risk          9.6          7.1     3X Average Risk          15.0          11.0                           Tests: (4) Hepatic/Liver Function Panel (HEPATIC)   Total Bilirubin           0.9 mg/dL                   4.1-3.2   Direct Bilirubin          0.1 mg/dL                   4.4-0.1   Alkaline Phosphatase      76 U/L  39-117   AST                       36 U/L                      0-37   ALT                       41 U/L                       0-53   Total Protein             7.4 g/dL                    1.0-2.7   Albumin                   4.4 g/dL                    2.5-3.6  Tests: (5) CBC Platelet w/Diff (CBCD)   White Cell Count          7.3 K/uL                    4.5-10.5   Red Cell Count            5.36 Mil/uL                 4.22-5.81   Hemoglobin                16.0 g/dL                   64.4-03.4   Hematocrit                46.6 %                      39.0-52.0   MCV                       86.9 fl                     78.0-100.0   MCHC                      34.4 g/dL                   74.2-59.5   RDW                       14.0 %                      11.5-14.6   Platelet Count            163.0 K/uL                  150.0-400.0   Neutrophil %              52.8 %                      43.0-77.0   Lymphocyte %              35.4 %                      12.0-46.0  Monocyte %                8.1 %                       3.0-12.0   Eosinophils%              3.1 %                       0.0-5.0   Basophils %               0.6 %                       0.0-3.0   Neutrophill Absolute      3.9 K/uL                    1.4-7.7   Lymphocyte Absolute       2.6 K/uL                    0.7-4.0   Monocyte Absolute         0.6 K/uL                    0.1-1.0  Eosinophils, Absolute                             0.2 K/uL                    0.0-0.7   Basophils Absolute        0.0 K/uL                    0.0-0.1  Tests: (6) Prostate Specific Antigen (PSA)   PSA-Hyb                   0.47 ng/mL                  0.10-4.00  DG LUMBAR SPINE COMPLETE - [id number]   Clinical Data: Low back pain radiating to the legs.   LUMBAR SPINE - COMPLETE 4+ VIEW   Comparison: None.   Findings: Vertebral body height and alignment are maintained.  The patient has multilevel loss of disc space height appearing worst at L2-3, L4-5 and L5-S1.  Facet degenerative change appears worst at L4-5 and L5-S1.   IMPRESSION: Multilevel spondylosis.  No  acute finding.   Read By:  Charyl Dancer,  M.D.  Immunization History:  Influenza Immunization History:    Influenza:  declined (11/13/2009)

## 2010-08-04 NOTE — Progress Notes (Signed)
Summary: [facility]  Phone Note Call from Patient   Summary of Call: Pt left vm that [facility] has not recieved the xanax refill that was faxed in last week.  Initial call taken by: Lamar Sprinkles, CMA,  December 03, 2009 1:25 PM  Follow-up for Phone Call        Pt informed, I spoke with pharmacy, they did not recieved original rx, called in new, only allowed 1 refill due to controlled substance Follow-up by: Lamar Sprinkles, CMA,  December 03, 2009 2:15 PM    Prescriptions: ALPRAZOLAM 0.5 MG TABS (ALPRAZOLAM) 1 by mouth q 8 as needed  #90 x 1   Entered by:   Lamar Sprinkles, CMA   Authorized by:   Jacques Navy MD   Signed by:   Lamar Sprinkles, CMA on 12/03/2009   Method used:   Telephoned to ...       [facility] (mail-order)             , [place]         Ph:        Fax: [phone]   RxID:   [id number]

## 2010-08-04 NOTE — Progress Notes (Signed)
Summary: Cozaar  Phone Note From Pharmacy Call back at [phone] ext [phone]   Caller: [facility]Faye Claudette Laws Summary of Call: has a question on the cozaar dosage REF # [phone] Initial call taken by: Migdalia Dk,  September 03, 2009 4:00 PM  Follow-up for Phone Call        I spoke with [facility] and they called to clarify the pt's Cozaar dosage.  I made them aware that this pt has been taking this doseage for years.   Follow-up by: Julieta Gutting, RN, BSN,  September 04, 2009 9:36 AM

## 2010-08-04 NOTE — Medication Information (Signed)
Summary: Prescription Refill Request  Prescription Refill Request   Imported By: Roderic Ovens 10/01/2009 13:03:48  _____________________________________________________________________  External Attachment:    Type:   Image     Comment:   External Document

## 2010-08-04 NOTE — Progress Notes (Signed)
Summary: Nuclear Pre-Procedure  Phone Note Outgoing Call Call back at [facility] Phone [phone]   Call placed by: Stanton Kidney, EMT-P,  Nov 20, 2009 2:38 PM Call placed to: Patient Action Taken: Phone Call Completed Summary of Call: Reviewed information on Myoview Information Sheet (see scanned document for further details).  Spoke with Patient.    Nuclear Med Background Indications for Stress Test: Evaluation for Ischemia   History: CABG, Heart Catheterization, Myocardial Infarction, Myocardial Perfusion Study  History Comments: '70 MI: DMI '87 CABG x 4 7/08 MPS 11/09 Heart Cath: Total LM & RCA with coll. EF=60%  Symptoms: Chest Pain    Nuclear Pre-Procedure Cardiac Risk Factors: Carotid Disease, Claudication, Family History - CAD, History of Smoking, Hypertension, Lipids, PVD, RBBB Height (in): 67

## 2010-08-04 NOTE — Assessment & Plan Note (Signed)
Summary: Cardiology Nuclear Study  Nuclear Med Background Indications for Stress Test: Evaluation for Ischemia   History: CABG, Heart Catheterization, Myocardial Infarction, Myocardial Perfusion Study  History Comments: '70 MI: DMI '87 CABG x 4 7/08 MPS 11/09 Heart Cath: Total LM & RCA with coll. EF=60%  Symptoms: Chest Pain, DOE, Fatigue    Nuclear Pre-Procedure Cardiac Risk Factors: Carotid Disease, Claudication, Family History - CAD, History of Smoking, Hypertension, Lipids, PVD, RBBB Caffeine/Decaff Intake: none NPO After: 8:00 PM Lungs: clear IV 0.9% NS with Angio Cath: 20g     IV Site: (R) Forearm IV Started by: Stanton Kidney EMT-P Chest Size (in) 44     Height (in): 66.75 Weight (lb): 189 BMI: 29.93 Tech Comments: metoprolol held this am, per patient.  Nuclear Med Study 1 or 2 day study:  1 day     Stress Test Type:  Eugenie Birks Reading MD:  Willa Rough, MD     Referring MD:  M.Cooper Resting Radionuclide:  Technetium 81m Tetrofosmin     Resting Radionuclide Dose:  11.0 mCi  Stress Radionuclide:  Technetium 42m Tetrofosmin     Stress Radionuclide Dose:  33.0 mCi   Stress Protocol   Lexiscan: 0.4 mg   Stress Test Technologist:  Milana Na EMT-P     Nuclear Technologist:  Domenic Polite CNMT  Rest Procedure  Myocardial perfusion imaging was performed at rest 45 minutes following the intravenous administration of Myoview Technetium 10m Tetrofosmin.  Stress Procedure  The patient received IV Lexiscan 0.4 mg over 15-seconds.  Myoview injected at 30-seconds.  There were no significant changes and a rare pvc with infusion.  Quantitative spect images were obtained after a 45 minute delay.  QPS Raw Data Images:  Normal; no motion artifact; normal heart/lung ratio. Stress Images:  Moderate decrease in activity at the base of the inferior wall. Rest Images:  Normal homogeneous uptake in all areas of the myocardium. Subtraction (SDS):  Ischemia at the base of the  inferior wall. Transient Ischemic Dilatation:  1.05  (Normal <1.22)  Lung/Heart Ratio:  .33  (Normal <0.45)  Quantitative Gated Spect Images QGS EDV:  79 ml QGS ESV:  27 ml QGS EF:  66 % QGS cine images:  Normal motion.  Findings Low risk nuclear study      Overall Impression  Exercise Capacity: Lexiscan BP Response: Normal blood pressure response. Clinical Symptoms: chest tight and nausea ECG Impression: No significant ST segment change suggestive of ischemia. Overall Impression Comments: There is ischemia localized to the base of the inferior wall. There was a similar finding on the image from 2008. This may be slightly more marked now.  Appended Document: Cardiology Nuclear Study overall low-risk. recommend continued medical therapy unless symptoms progress.  Appended Document: Cardiology Nuclear Study Dr Juanda Chance reviewed the pt's results during his appt on 11/27/09.

## 2010-08-04 NOTE — Miscellaneous (Signed)
Summary: nuc results 05.23.2011  Clinical Lists Changes  Observations: Added new observation of NUCLEAR NOS:  Overall Impression   Exercise Capacity: Lexiscan BP Response: Normal blood pressure response. Clinical Symptoms: chest tight and nausea ECG Impression: No significant ST segment change suggestive of ischemia. Overall Impression Comments: There is ischemia localized to the base of the inferior wall. There was a similar finding on the image from 2008. This may be slightly more marked now.   (11/24/2009 12:17)      Nuclear Study  Procedure date:  11/24/2009  Findings:       Overall Impression   Exercise Capacity: Lexiscan BP Response: Normal blood pressure response. Clinical Symptoms: chest tight and nausea ECG Impression: No significant ST segment change suggestive of ischemia. Overall Impression Comments: There is ischemia localized to the base of the inferior wall. There was a similar finding on the image from 2008. This may be slightly more marked now.

## 2010-08-04 NOTE — Miscellaneous (Signed)
Summary: Orders Update  Clinical Lists Changes  Orders: Added new Test order of Abdominal Aorta Duplex (Abd Aorta Duplex) - Signed 

## 2010-08-04 NOTE — Progress Notes (Signed)
Summary: [facility] pt/Refill  Phone Note Refill Request   Refills Requested: Medication #1:  ALPRAZOLAM 0.5 MG TABS 1 by mouth q 8 as needed Patient is requesting rx to go to [facility] for 90 day supply OK?   Initial call taken by: Lamar Sprinkles, CMA,  Nov 24, 2009 3:42 PM  Follow-up for Phone Call        [facility] x3 Follow-up by: Tresa Garter MD,  Nov 24, 2009 5:55 PM  Additional Follow-up for Phone Call Additional follow up Details #1::        rx faxed to [facility] Additional Follow-up by: Ami Bullins CMA,  Nov 25, 2009 9:50 AM    Prescriptions: ALPRAZOLAM 0.5 MG TABS (ALPRAZOLAM) 1 by mouth q 8 as needed  #90 x 3   Entered by:   Ami Bullins CMA   Authorized by:   Tresa Garter MD   Signed by:   Bill Salinas CMA on 11/25/2009   Method used:   Printed then faxed to ...       [facility] (mail-order)             , [place]         Ph:        Fax: [phone]   RxID:   [id number]

## 2010-08-04 NOTE — Assessment & Plan Note (Signed)
Summary: consult for mild osa   Copy to:  Tonny Bollman Primary Provider/Referring Provider:  Jacques Navy MD  CC:  Sleep Consult.  History of Present Illness: The pt is a 65y/o male who I have been asked to see for mild osa. He has had a sleep study this past June, which showed an AHI of 17/hr and transient desaturation as low as 83%.  He was also noted to have large numbers of leg jerks with some sleep disruption.   He has been told that he has loud snoring at night, and also pauses in his breathing during sleep.  He has also noted kicking at night, "like muscle spasms", but denies classic RLS symptoms.  He does have chronic back pain which affects his legs.  He typically goes to bed btw 10-11 pm, and arises at 5-7am to start his day.  He does not feel unrested in the am upon arising, but does admit to having frequent awakenings during the night.  He can however, doze watching tv or reading the newspaper around 8am.  He also noted significant sleep pressure during the day with periods of inactivity.  He denies any sleepiness with driving except on long trips.  His epworth score today is very abnormal at 17, and he tells me his weight has increased 30 pounds over the last 2 years.  Current Medications (verified): 1)  Lipitor 80 Mg Tabs (Atorvastatin Calcium) .... Take One Tablet By Mouth Daily. 2)  Zetia 10 Mg Tabs (Ezetimibe) .... Take One Tablet By Mouth Daily. 3)  Cialis 10 Mg Tabs (Tadalafil) .... Take 1 Tablet By Mouth As Directed 4)  Cozaar 100 Mg Tabs (Losartan Potassium) .... Take 1/3 Tablet Daily 5)  Metoprolol Succinate 100 Mg Xr24h-Tab (Metoprolol Succinate) .... Take 1/2 Tablet Daily 6)  Plavix 75 Mg Tabs (Clopidogrel Bisulfate) .... Take One Tablet By Mouth Once Daily. 7)  Omeprazole 20 Mg Cpdr (Omeprazole) .... Take 1 Tablet By Mouth Every Day 8)  Alprazolam 0.5 Mg Tabs (Alprazolam) .Marland Kitchen.. 1 By Mouth Q 8 As Needed 9)  Vitamin-B Complex  Tabs (B Complex Vitamins) .... Take 1  Tablet By Mouth Once A Day 10)  Vitamin D 2000 Units .... Take 1 Tablet By Mouth Once A Day 11)  Niaspan 500 Mg Cr-Tabs (Niacin (Antihyperlipidemic)) .... Take 4 Tablets At Bedtime 12)  Aspirin 81 Mg Tbec (Aspirin) .... Take One Tablet By Mouth Daily 13)  Axiron .... Use As Directed- 2 Pumps Daily  Allergies (verified): No Known Drug Allergies  Past History:  Past Medical History: Reviewed history from 11/18/2008 and no changes required. MYOCARDIAL INFARCTION, HX OF (ICD-412) HYPERTENSION (ICD-401.9) ERECTILE DYSFUNCTION (ICD-302.72) HYPERGLYCEMIA, BORDERLINE (ICD-790.29) DEGENERATIVE DISC DISEASE (ICD-722.6) OBESITY, CLASS I (ICD-278.02) LOW BACK PAIN, CHRONIC (ICD-724.2) ABDOMINAL AORTIC ANEURYSM (ICD-441.4) PERIPHERAL VASCULAR DISEASE (ICD-443.9) NOCTURIA (ICD-788.43) GERD (ICD-530.81) HYPERLIPIDEMIA, ATHEROGENIC (ICD-272.4) CORONARY ARTERY DISEASE, PREMATURE (ICD-429.9)    Past Surgical History: CORONARY ARTERY BYPASS GRAFT, HX OF (ICD-V45.81) TONSILLECTOMY, HX OF (ICD-V45.79)  at age 77  Family History: Reviewed history from 11/11/2008 and no changes required. father - deceased @ 43: CAD/MI mother - deceased @ 51: CHF, lipids,  jaw CA PGF deceased @ 65: CAD/MI Brother - prostate cancer Neg - DM  Social History: Reviewed history from 11/11/2008 and no changes required. 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  started at age 65.  1 ppd.  quit approx 1976.  Review of Systems       The patient complains of weight change and tooth/dental problems.  The patient denies shortness of breath with activity, shortness of breath at rest, productive cough, non-productive cough, coughing up blood, chest pain, irregular heartbeats, acid heartburn, indigestion, abdominal pain, difficulty swallowing, sore throat, headaches, nasal  congestion/difficulty breathing through nose, sneezing, itching, ear ache, anxiety, depression, hand/feet swelling, joint stiffness or pain, rash, change in color of mucus, and fever.    Vital Signs:  Patient profile:   65 year old male Height:      66 inches Weight:      197.25 pounds BMI:     31.95 O2 Sat:      96 % on Room air Temp:     97.8 degrees F oral Pulse rate:   72 / minute BP sitting:   128 / 76  (right arm) Cuff size:   regular  Vitals Entered By: Arman Filter LPN (February 02, 2010 11:29 AM)  O2 Flow:  Room air CC: Sleep Consult Comments Medications reviewed with patient Arman Filter LPN  February 02, 2010 11:29 AM    Physical Exam  General:  ow male in nad Eyes:  PERRLA and EOMI.   Nose:  deviated septum to left with narrowing. Mouth:  elongation of soft palate and uvula Neck:  no jvd, tmg, LN Lungs:  clear to auscultation. Heart:  rrr, no mrg Abdomen:  soft and nontender, bs+ Extremities:  mild edema, pulses intact no cyanosis Neurologic:  alert and oriented , moves all 4.   Impression & Recommendations:  Problem # 1:  OBSTRUCTIVE SLEEP APNEA (ICD-327.23)  the pt has only mild osa by his recent sleep study, but does have daytime sleepiness with periods of inactivity.  I have had a long discussion with the pt about sleep apnea, including its impact on QOL and CV health.  At his level of sleep disordered breathing, there is probably only mild impact on his underlying CV health.  I have outlined a conservative treatment approach with a trial of weight loss alone, versus more aggressive therapy with surgery, dental appliance, or cpap.  At this point, the pt would like to take the next 6mos and work aggressively on weight loss.  If he feels this is a struggle due to ongoing sleepiness, or would like to try more aggressive therapy, he needs to call me in the interim.  Will re-assess at 6mos.  Medications Added to Medication List This Visit: 1)  Axiron  .... Use  as directed- 2 pumps daily  Other Orders: Consultation Level IV (16109)  Patient Instructions: 1)  work on weight loss and positional therapy for the next 6mos, and let's see how things go. 2)  please call if you would like to try surgery, dental appliance, or cpap for more aggressive treatment of your osa. 3)  followup with me in 6mos.

## 2010-08-04 NOTE — Progress Notes (Signed)
Summary: Questions about labs  Phone Note Call from Patient Call back at Home Phone [phone] Call back at Work Phone [phone]   Caller: Patient Summary of Call: Pt have question about having labs drawn before appt on 06/24/10 Initial call taken by: Judie Grieve,  May 20, 2010 9:46 AM  Follow-up for Phone Call        pt adv MD normally looks at cholesterol and LFT. Per pt request will schedule labs before December appt with Dr. Excell Seltzer.  Follow-up by: Claris Gladden RN,  May 20, 2010 10:16 AM

## 2010-08-04 NOTE — Miscellaneous (Signed)
Summary: med update  Clinical Lists Changes  Medications: Changed medication from LIPITOR 40 MG TABS (ATORVASTATIN CALCIUM) Take two tablets  by mouth daily. to LIPITOR 80 MG TABS (ATORVASTATIN CALCIUM) Take one tablet by mouth daily.

## 2010-08-04 NOTE — Progress Notes (Signed)
Summary: RFEFILLS  Phone Note Refill Request Call back at Home Phone [phone] Message from:  Patient on August 26, 2009 9:08 AM  Refills Requested: Medication #1:  COZAAR 100 MG TABS Take 1/3 tablet daily  Medication #2:  PLAVIX 75 MG TABS Take one tablet by mouth once daily.  Medication #3:  METOPROLOL SUCCINATE 100 MG XR24H-TAB Take 1/2 tablet daily  Medication #4:  ZETIA 10 MG TABS Take one tablet by mouth daily. LIPITOR 40MG ,NEED 3 MONTH SUPPLE SEND TO [facility]  NOT [facility]. AND PT HAVE QUESTIONS ABOUT GENERIC BRAND FOR LIPITOR AND ZETIA. please add this med to list to call in alprazolam 5mg   Initial call taken by: Judie Grieve,  August 26, 2009 9:12 AM Caller: Patient  Follow-up for Phone Call        Rx faxed to pharmacy Follow-up by: Vikki Ports,  August 26, 2009 3:34 PM    Prescriptions: PLAVIX 75 MG TABS (CLOPIDOGREL BISULFATE) Take one tablet by mouth once daily.  #90 x 3   Entered by:   Vikki Ports   Authorized by:   Norva Karvonen, MD   Signed by:   Vikki Ports on 08/26/2009   Method used:   Faxed to ...       [facility] (mail-order)             , [place]         Ph:        Fax: [phone]   RxID:   [id number] METOPROLOL SUCCINATE 100 MG XR24H-TAB (METOPROLOL SUCCINATE) Take 1/2 tablet daily  #90 x 0   Entered by:   Vikki Ports   Authorized by:   Norva Karvonen, MD   Signed by:   Vikki Ports on 08/26/2009   Method used:   Faxed to ...       [facility] ([facility]             , [place]         Ph:        Fax: [phone]   RxID:   [id number] COZAAR 100 MG TABS (LOSARTAN POTASSIUM) Take 1/3 tablet daily  #90 x 0   Entered by:   Vikki Ports   Authorized by:   Norva Karvonen, MD   Signed by:   Vikki Ports on 08/26/2009   Method used:   Faxed to ...       [facility] (mail-order)             , [place]         Ph:        Fax: [phone]   RxID:   [id number] ZETIA 10 MG TABS (EZETIMIBE) Take one tablet by  mouth daily.  #90 x 3   Entered by:   Vikki Ports   Authorized by:   Norva Karvonen, MD   Signed by:   Vikki Ports on 08/26/2009   Method used:   Faxed to ...       [facility] (mail-order)             , [place]         Ph:        Fax: [phone]   RxID:   [id number] LIPITOR 40 MG TABS (ATORVASTATIN CALCIUM) Take two tablets  by mouth daily.  #180 x 3   Entered by:   Vikki Ports   Authorized by:   Norva Karvonen, MD   Signed by:   Vikki Ports on 08/26/2009  Method used:   Faxed to ...       [facility] (mail-order)             , [place]         Ph:        Fax: [phone]   RxID:   [id number]

## 2010-08-04 NOTE — Letter (Signed)
[facility]   Imported By: Sherian Rein 12/09/2009 10:13:34  _____________________________________________________________________  External Attachment:    Type:   Image     Comment:   External Document

## 2010-08-06 NOTE — Letter (Signed)
Summary: [facility]   Imported By: Sherian Rein 06/18/2010 10:42:32  _____________________________________________________________________  External Attachment:    Type:   Image     Comment:   External Document

## 2010-08-06 NOTE — Progress Notes (Signed)
Summary: Joshua Brown  Phone Note Call from Patient Call back at [facility] Phone [phone]   Caller: Patient Call For: Jacques Navy MD Summary of Call: Pt needs generic of Xanax sent to [facility] from now on. He does not refill at the moment but will need in a wk or so. Pt sched for yearly in May 2012. Initial call taken by: Verdell Face,  July 09, 2010 9:27 AM  Follow-up for Phone Call        ok for refill as requested to where requested x 5 Follow-up by: Jacques Navy MD,  July 09, 2010 1:01 PM    Prescriptions: ALPRAZOLAM 0.5 MG TABS (ALPRAZOLAM) 1 by mouth q 8 as needed  #90 x 5   Entered by:   Lamar Sprinkles, CMA   Authorized by:   Jacques Navy MD   Signed by:   Lamar Sprinkles, CMA on 07/09/2010   Method used:   Telephoned to ...       [facility] [address] (retail)       [phone]       [place], [place]  [address]       Ph: [phone]       Fax: [phone]   RxID:   [id number]

## 2010-08-06 NOTE — Progress Notes (Signed)
Summary: rx refill  Phone Note Call from Patient   Caller: Patient Reason for Call: Refill Medication Summary of Call: pt states that he has a new place where his meds need to be sent to.  [facility] fax# [phone]. pt states the cover page needs to have pt id# [id number] pt name dob and address phone#. pt states if you can write hold on the coverpage so they wont send it it right away.   pt needs all of his meds to be sent as well to [facility] to be fill  fax# [phone]. Initial call taken by: Roe Coombs,  July 09, 2010 9:03 AM  Follow-up for Phone Call        RX's faxed to [facility]. Vikki Ports  July 09, 2010 1:19 PM. Prescriptions printed will be faxed to [facility]     Prescriptions: NIASPAN 500 MG CR-TABS (NIACIN (ANTIHYPERLIPIDEMIC)) take 4 tablets at bedtime  #120 x 3   Entered by:   Vikki Ports   Authorized by:   Norva Karvonen, MD   Signed by:   Vikki Ports on 07/09/2010   Method used:   Faxed to ...       [facility] [address] (retail)       [phone]       [place], [place]  [address]       Ph: [phone]       Fax: [phone]   RxID:   [id number] PANTOPRAZOLE SODIUM 40 MG TBEC (PANTOPRAZOLE SODIUM) take one tablet by mouth daily  #30 x 3   Entered by:   Vikki Ports   Authorized by:   Norva Karvonen, MD   Signed by:   Vikki Ports on 07/09/2010   Method used:   Faxed to ...       [facility] [address] (retail)       [phone]       [place], [place]  [address]       Ph: [phone]       Fax: [phone]   RxID:   [id number] PLAVIX 75 MG TABS (CLOPIDOGREL BISULFATE) Take one tablet by mouth once daily.  #30 x 3   Entered by:   Vikki Ports   Authorized by:   Norva Karvonen, MD   Signed by:   Vikki Ports on 07/09/2010   Method used:   Faxed to ...       [facility] [address] (retail)       [phone]    [place], [place]  [address]       Ph: [phone]       Fax: [phone]   RxID:   [id number] METOPROLOL SUCCINATE 100 MG XR24H-TAB (METOPROLOL SUCCINATE) Take 1/2 tablet daily  #45 x 3   Entered by:   Vikki Ports   Authorized by:   Norva Karvonen, MD   Signed by:   Vikki Ports on 07/09/2010   Method used:   Faxed to ...       [facility] [address] (retail)       [phone]       [place], [place]  [address]       Ph: [phone]       Fax: [phone]   RxID:  [id number] COZAAR 100 MG TABS (LOSARTAN POTASSIUM) Take 1/3 tablet daily  #30 x 3   Entered by:   Vikki Ports   Authorized by:   Norva Karvonen, MD   Signed by:   Vikki Ports on 07/09/2010   Method used:   Faxed to ...       [facility] [address] (retail)       [phone]       [place], [place]  [address]       Ph: [phone]       Fax: [phone]   RxID:   [id number] ZETIA 10 MG TABS (EZETIMIBE) Take 1 tablet daily  #30 x 3   Entered by:   Vikki Ports   Authorized by:   Norva Karvonen, MD   Signed by:   Vikki Ports on 07/09/2010   Method used:   Faxed to ...       [facility] [address] (retail)       [phone]       [place], [place]  [address]       Ph: [phone]       Fax: [phone]   RxID:   [id number] LIPITOR 80 MG TABS (ATORVASTATIN CALCIUM) Take one tablet by mouth daily.  #30 x 3   Entered by:   Vikki Ports   Authorized by:   Norva Karvonen, MD   Signed by:   Vikki Ports on 07/09/2010   Method used:   Faxed to ...       [facility] [address] (retail)       [phone]       [place], [place]  [address]       Ph: [phone]       Fax: [phone]   RxID:   [id number]

## 2010-08-06 NOTE — Assessment & Plan Note (Signed)
Summary: 6 month rov   Visit Type:  Follow-up Referring Provider:  Tonny Bollman Primary Provider:  Jacques Navy MD  CC:  none.  History of Present Illness: 65 year-old male returns for management of PAD. He is followed by Dr Juanda Chance for cardiac issues, s/p CABG, and I will be assuming his care in Dr Regino Schultze retirement.  The patient has no specific complaints today. He denies chest pain, dyspnea, or claudication symptoms at present. No palpitations, edema, lightheadedness, orthopnea, or PND.  Current Medications (verified): 1)  Lipitor 80 Mg Tabs (Atorvastatin Calcium) .... Take One Tablet By Mouth Daily. 2)  Zetia 10 Mg Tabs (Ezetimibe) .... Hold 3)  Cialis 10 Mg Tabs (Tadalafil) .... Take 1 Tablet By Mouth As Directed 4)  Cozaar 100 Mg Tabs (Losartan Potassium) .... Take 1/3 Tablet Daily 5)  Metoprolol Succinate 100 Mg Xr24h-Tab (Metoprolol Succinate) .... Take 1/2 Tablet Daily 6)  Plavix 75 Mg Tabs (Clopidogrel Bisulfate) .... Take One Tablet By Mouth Once Daily. 7)  Omeprazole 20 Mg Cpdr (Omeprazole) .... Take 1 Tablet By Mouth Every Day 8)  Alprazolam 0.5 Mg Tabs (Alprazolam) .Marland Kitchen.. 1 By Mouth Q 8 As Needed 9)  Vitamin-B Complex  Tabs (B Complex Vitamins) .... Take 1 Tablet By Mouth Once A Day 10)  Vitamin D 2000 Units .... Take 1 Tablet By Mouth Once A Day 11)  Niaspan 500 Mg Cr-Tabs (Niacin (Antihyperlipidemic)) .... Take 4 Tablets At Bedtime 12)  Aspirin 81 Mg Tbec (Aspirin) .... Take One Tablet By Mouth Daily 13)  Axiron .... Use As Directed- 2 Pumps Daily  Allergies (verified): No Known Drug Allergies  Past History:  Past medical history reviewed for relevance to current acute and chronic problems.  Past Medical History: Reviewed history from 11/18/2008 and no changes required. MYOCARDIAL INFARCTION, HX OF (ICD-412) HYPERTENSION (ICD-401.9) ERECTILE DYSFUNCTION (ICD-302.72) HYPERGLYCEMIA, BORDERLINE (ICD-790.29) DEGENERATIVE DISC DISEASE (ICD-722.6) OBESITY,  CLASS I (ICD-278.02) LOW BACK PAIN, CHRONIC (ICD-724.2) ABDOMINAL AORTIC ANEURYSM (ICD-441.4) PERIPHERAL VASCULAR DISEASE (ICD-443.9) NOCTURIA (ICD-788.43) GERD (ICD-530.81) HYPERLIPIDEMIA, ATHEROGENIC (ICD-272.4) CORONARY ARTERY DISEASE, PREMATURE (ICD-429.9)    Review of Systems       Negative except as per HPI   Vital Signs:  Patient profile:   65 year old male Height:      66 inches Weight:      188 pounds BMI:     30.45 Pulse rate:   69 / minute Resp:     16 per minute BP sitting:   118 / 88  (right arm)  Vitals Entered By: Marrion Coy, CNA (June 24, 2010 9:51 AM)  Physical Exam  General:  Pt is alert and oriented, in no acute distress. HEENT: normal Neck: normal carotid upstrokes without bruits, JVP normal Lungs: CTA CV: RRR without murmur or gallop Abd: soft, NT, positive BS, no bruit, no organomegaly Ext: no clubbing, cyanosis, or edema. peripheral pulses 2+ and equal Skin: warm and dry without rash    Impression & Recommendations:  Problem # 1:  PERIPHERAL VASCULAR DISEASE (ICD-443.9) ABI's 0.8 bilaterally. The patient has distal abdominal aortic stenosis with minimal symptoms. Recommend repeat duplex scan in Spring 2012 for one year followup. Discussed a walking program, weight loss, and continued medical management.  Problem # 2:  CORONARY ARTERY DISEASE, PREMATURE (ICD-429.9) No angina. STable Myoview exam earlier this year. Continue medical management. Change omeprazole to pantoprazole in setting of chronic plavix use.  Problem # 3:  HYPERTENSION (ICD-401.9) Controlled.  His updated medication list for this problem includes:  Cozaar 100 Mg Tabs (Losartan potassium) .Marland Kitchen... Take 1/3 tablet daily    Metoprolol Succinate 100 Mg Xr24h-tab (Metoprolol succinate) .Marland Kitchen... Take 1/2 tablet daily    Aspirin 81 Mg Tbec (Aspirin) .Marland Kitchen... Take one tablet by mouth daily  Orders: [facility] ([facility])  BP today: 118/88 Prior BP: 128/76  (02/02/2010)  Labs Reviewed: K+: 4.1 (06/17/2010) Creat: : 1.1 (06/17/2010)   Chol: 149 (06/17/2010)   HDL: 33.10 (06/17/2010)   LDL: 101 (06/17/2010)   TG: 76.0 (06/17/2010)  Patient Instructions: 1)  Your physician has recommended you make the following change in your medication: STOP Omeprazole, START Pantoprazole 40mg  once a day 2)  Your physician wants you to follow-up in:  6 MONTHS.  You will receive a reminder letter in the mail two months in advance. If you don't receive a letter, please call our office to schedule the follow-up appointment. 3)  An appointment for you to see the Pharmacist has been scheduled ([facility]). Please keep your appointment or be sure to call if you need to reschedule. 4)  Your physician has requested that you have an VISCERAL duplex and ankle brachial index (ABI) prior to your 6 MONTH appointment. During this test an ultrasound and blood pressure cuff are used to evaluate the arteries that supply the arms and legs with blood. Allow thirty minutes for this exam. There are no restrictions or special instructions. Prescriptions: PANTOPRAZOLE SODIUM 40 MG TBEC (PANTOPRAZOLE SODIUM) take one tablet by mouth daily  #90 x 3   Entered by:   Julieta Gutting, RN, BSN   Authorized by:   Norva Karvonen, MD   Signed by:   Julieta Gutting, RN, BSN on 06/24/2010   Method used:   Print then Give to Patient   RxID:   [id number]

## 2010-08-06 NOTE — Miscellaneous (Signed)
Summary: RX's  printed  Clinical Lists Changes  Medications: Changed medication from LIPITOR 80 MG TABS (ATORVASTATIN CALCIUM) Take one tablet by mouth daily. to LIPITOR 80 MG TABS (ATORVASTATIN CALCIUM) Take one tablet by mouth daily. - Signed Changed medication from ZETIA 10 MG TABS (EZETIMIBE) Take 1 tablet daily to ZETIA 10 MG TABS (EZETIMIBE) Take 1 tablet daily - Signed Changed medication from LIPITOR 80 MG TABS (ATORVASTATIN CALCIUM) Take one tablet by mouth daily. to LIPITOR 80 MG TABS (ATORVASTATIN CALCIUM) Take one tablet by mouth daily. - Signed Changed medication from COZAAR 100 MG TABS (LOSARTAN POTASSIUM) Take 1/3 tablet daily to COZAAR 100 MG TABS (LOSARTAN POTASSIUM) Take as directed. - Signed Changed medication from METOPROLOL SUCCINATE 100 MG XR24H-TAB (METOPROLOL SUCCINATE) Take 1/2 tablet daily to METOPROLOL SUCCINATE 100 MG XR24H-TAB (METOPROLOL SUCCINATE) Take 1/2 tablet daily - Signed Changed medication from PLAVIX 75 MG TABS (CLOPIDOGREL BISULFATE) Take one tablet by mouth once daily. to PLAVIX 75 MG TABS (CLOPIDOGREL BISULFATE) Take one tablet by mouth once daily. - Signed Changed medication from PANTOPRAZOLE SODIUM 40 MG TBEC (PANTOPRAZOLE SODIUM) take one tablet by mouth daily to PANTOPRAZOLE SODIUM 40 MG TBEC (PANTOPRAZOLE SODIUM) take one tablet by mouth daily - Signed Changed medication from NIASPAN 500 MG CR-TABS (NIACIN (ANTIHYPERLIPIDEMIC)) take 4 tablets at bedtime to NIASPAN 500 MG CR-TABS (NIACIN (ANTIHYPERLIPIDEMIC)) take 4 tablets at bedtime - Signed Rx of LIPITOR 80 MG TABS (ATORVASTATIN CALCIUM) Take one tablet by mouth daily.;  #90 x 3;  Signed;  Entered by: Vikki Ports;  Authorized by: Vikki Ports;  Method used: Printed then faxed to [facility] [phone] [place], [place], [place]  [address], Ph: [phone], Fax: [phone] Rx of ZETIA 10 MG TABS (EZETIMIBE) Take 1 tablet daily;  #90 x 3;  Signed;  Entered by: Vikki Ports;  Authorized by: Norva Karvonen, MD;  Method used: Printed then faxed to [facility] [phone] [place], [place], [place]  [address], Ph: [phone], Fax: [phone] Rx of LIPITOR 80 MG TABS (ATORVASTATIN CALCIUM) Take one tablet by mouth daily.;  #90 x 3;  Signed;  Entered by: Vikki Ports;  Authorized by: Norva Karvonen, MD;  Method used: Printed then faxed to [facility] [phone] [place], [place], [place]  [address], Ph: [phone], Fax: [phone] Rx of COZAAR 100 MG TABS (LOSARTAN POTASSIUM) Take as directed.;  #90 x 3;  Signed;  Entered by: Vikki Ports;  Authorized by: Norva Karvonen, MD;  Method used: Printed then faxed to [facility] [phone] [place], [place], [place]  [address], Ph: [phone], Fax: [phone] Rx of METOPROLOL SUCCINATE 100 MG XR24H-TAB (METOPROLOL SUCCINATE) Take 1/2 tablet daily;  #45 x 3;  Signed;  Entered by: Vikki Ports;  Authorized by: Norva Karvonen, MD;  Method used: Printed then faxed to [facility] [phone] [place], [place], [place]  [address], Ph: [phone], Fax: [phone] Rx of PLAVIX 75 MG TABS (CLOPIDOGREL BISULFATE) Take one tablet by mouth once daily.;  #90 x 3;  Signed;  Entered by: Vikki Ports;  Authorized by: Norva Karvonen, MD;  Method used: Printed then faxed to [facility] [phone] [place], [place], [place]  [address], Ph: [phone], Fax: [phone] Rx of PANTOPRAZOLE SODIUM 40 MG TBEC (PANTOPRAZOLE SODIUM) take one tablet by mouth daily;  #90 x 3;  Signed;  Entered by: Vikki Ports;  Authorized by: Norva Karvonen, MD;  Method used: Printed then faxed to [facility] [phone] [place], [place], [place]  [address], Ph: [phone], Fax: [phone] Rx of NIASPAN 500 MG CR-TABS (NIACIN  (ANTIHYPERLIPIDEMIC)) take 4 tablets at bedtime;  #360 x 3;  Signed;  Entered by: Vikki Ports;  Authorized by: Norva Karvonen, MD;  Method used: Printed then faxed to [facility] [phone] [place], [place], [place]  [address], Ph: [phone], Fax: [phone]    Prescriptions: NIASPAN 500 MG CR-TABS (NIACIN (ANTIHYPERLIPIDEMIC)) take 4 tablets at bedtime  #360 x 3   Entered by:   Vikki Ports   Authorized by:   Norva Karvonen, MD   Signed by:   Vikki Ports on 07/13/2010   Method used:   Printed then faxed to ...       [facility] [address] (retail)       [phone]       [place], [place]  [address]       Ph: [phone]       Fax: [phone]   RxID:   [id number] PANTOPRAZOLE SODIUM 40 MG TBEC (PANTOPRAZOLE SODIUM) take one tablet by mouth daily  #90 x 3   Entered by:   Vikki Ports   Authorized by:   Norva Karvonen, MD   Signed by:   Vikki Ports on 07/13/2010   Method used:   Printed then faxed to ...       [facility] [address] (retail)       [phone]       [place], [place]  [address]       Ph: [phone]       Fax: [phone]   RxID:   [id number] PLAVIX 75 MG TABS (CLOPIDOGREL BISULFATE) Take one tablet by mouth once daily.  #90 x 3   Entered by:   Vikki Ports   Authorized by:   Norva Karvonen, MD   Signed by:   Vikki Ports on 07/13/2010   Method used:   Printed then faxed to ...       [facility] [address] (retail)       [phone]       [place], [place]  [address]       Ph: [phone]       Fax: [phone]   RxID:   [id number] METOPROLOL SUCCINATE 100 MG XR24H-TAB (METOPROLOL SUCCINATE) Take 1/2 tablet daily  #45 x 3   Entered by:   Vikki Ports   Authorized by:   Norva Karvonen, MD   Signed by:   Vikki Ports on 07/13/2010   Method used:   Printed then  faxed to ...       [facility] [address] (retail)       [phone]       [place], [place]  [address]       Ph: [phone]       Fax: [phone]   RxID:   [id number] COZAAR 100 MG TABS (LOSARTAN POTASSIUM) Take as directed.  #90 x 3   Entered by:   Vikki Ports   Authorized by:   Norva Karvonen, MD   Signed by:   Vikki Ports on 07/13/2010  Method used:   Printed then faxed to ...       [facility] [address] (retail)       [phone]       [place], [place]  [address]       Ph: [phone]       Fax: [phone]   RxID:   [id number] LIPITOR 80 MG TABS (ATORVASTATIN CALCIUM) Take one tablet by mouth daily.  #90 x 3   Entered by:   Vikki Ports   Authorized by:   Norva Karvonen, MD   Signed by:   Vikki Ports on 07/13/2010   Method used:   Printed then faxed to ...       [facility] [address] (retail)       [phone]       [place], [place]  [address]       Ph: [phone]       Fax: [phone]   RxID:   [id number] ZETIA 10 MG TABS (EZETIMIBE) Take 1 tablet daily  #90 x 3   Entered by:   Vikki Ports   Authorized by:   Norva Karvonen, MD   Signed by:   Vikki Ports on 07/13/2010   Method used:   Printed then faxed to ...       [facility] [address] (retail)       [phone]       [place], [place]  [address]       Ph: [phone]       Fax: [phone]   RxID:   [id number] LIPITOR 80 MG TABS (ATORVASTATIN CALCIUM) Take one tablet by mouth daily.  #90 x 3   Entered and Authorized by:   Vikki Ports   Signed by:   Vikki Ports on 07/13/2010   Method used:   Printed then faxed to ...       [facility] [address] (retail)       [phone]       [place], [place]  [address]       Ph: [phone]       Fax: [phone]   RxID:    [id number]

## 2010-08-06 NOTE — Assessment & Plan Note (Signed)
Summary: had seen lipid clinic in past/saf   Referring Provider:  Tonny Bollman Primary Provider:  Jacques Navy MD   History of Present Illness: Joshua Brown presents to lipid clinic today for initial work-up.  He reports having a history with the lipid clinic, but was discharged years ago once his panel was in good control.  He is being transitioned to Dr. Earmon Phoenix care, due to the retirement of Dr. Juanda Chance.  Dr. Excell Seltzer has referred him to [place] for lipid managment.  Joshua Brown most recent lipid panel reveals that he is close to goal regarding both his HDL and LDL.    Patient has a history of CAD, s/p CABG in 1987, then catheterization in 2002 which revealed occluded right vein graft.  He also has a history of PAD and has been seen by Dr. Excell Seltzer for this as well.      Patient presents today for initial lipid clinic work-up.  His current lipid regimen includes Lipitor 80 mg daily, Niacin 500 mg - 4 tablets each night, and Zetia 10 mg daily.  Patient is currently holding Zetia due to cost.  He has fallen into the Medicare Part D "donut hole" and has decided to hold zetia for now.  He plans to resume this starting in the new year.  He does not report adverse effects of complaints of any of these medications.    Patient appears to maintain a healthy diet. Breakfast:  2% cottage cheese with cinnamon, fresh vegetables (including pepper, cucumber, and tomato), and pickled herring or pickles beats, occasionally chicken sausage or oatmeal with nuts.  Lunch:  Leftovers from the night before Dinner:  Rarely eats out.  Cooks mainly fish and chicken, does not fry, uses only olive oil, and low-fat dairy products  With regard to exercise, patient was doing well up until about 2-3 years ago, when he started falling out of habit.  He then joined the [employer] program at the [facility] and started going regularly until about 1 month ago when a project distracted him and he has not been back since.  He is motivated  to resume exercising and plans to do so once the new year begins.  Patient does have hip joint pain and lower back degenerative disc which occasionally gives him trouble during exercise.  He uses [facility] for these issues.      Lipid Management Provider  Eda Keys, PharmD  Allergies: No Known Drug Allergies   Vital Signs:  Patient profile:   65 year old male Weight:      189 pounds  Impression & Recommendations:  Problem # 1:  HYPERLIPIDEMIA, ATHEROGENIC (ICD-272.4) Assessment Deteriorated Lipid Panel is as follows:  TC 149(goal<200), TG 76 (goal <150), HDL 33.1 (goal >40), LDL 101 (goal <70).   Patient is at goal for TG and TC; however, he is not at goal for HDL or LDL.  Factors contributing to this include lack of exercise as well as holding zetia.  Patient reports a fairly healthy diet, but does admit to uncontrolled portion sizes.  We have discussed watching portion sizes.  Will not make any med changes at this time, but will resume normal regimen at the start of the new year and will resume exercising.    Plan: Continue Lipitor and Niacin Resume Zetia Resume exercising at least 3 days per week Continue healthy dietary habits, limit portion sizes Follow-up in 3 months.   His updated medication list for this problem includes:    Lipitor 80  Mg Tabs (Atorvastatin calcium) .Marland Kitchen... Take one tablet by mouth daily.    Zetia 10 Mg Tabs (Ezetimibe) .Marland Kitchen... Take 1 tablet daily    Niaspan 500 Mg Cr-tabs (Niacin (antihyperlipidemic)) .Marland Kitchen... Take 4 tablets at bedtime  Patient Instructions: 1)  1)  Continue Lipitor 80 mg and Niacin 500 mg (4 tablets daily) 2)  2)  Resume taking Zetia 10 mg daily 3)  3)  Resume participation in [employer] program - attempt to exercise at least 3 days per week. 4)  4)  Return to clinic in 3 months.

## 2010-08-10 ENCOUNTER — Ambulatory Visit: Payer: Self-pay | Admitting: Pulmonary Disease

## 2010-08-26 NOTE — Medication Information (Signed)
Summary: Right Source Direction Clarification Needed   Right Source Direction Clarification Needed   Imported By: Roderic Ovens 08/17/2010 16:24:37  _____________________________________________________________________  External Attachment:    Type:   Image     Comment:   External Document

## 2010-09-16 ENCOUNTER — Encounter: Payer: Self-pay | Admitting: Internal Medicine

## 2010-09-28 ENCOUNTER — Other Ambulatory Visit: Payer: Self-pay

## 2010-10-01 ENCOUNTER — Ambulatory Visit: Payer: Self-pay

## 2010-10-01 NOTE — Letter (Signed)
[facility]: [facility]   Imported By: Sherian Rein 09/21/2010 08:58:21  _____________________________________________________________________  External Attachment:    Type:   Image     Comment:   External Document

## 2010-11-13 ENCOUNTER — Encounter: Payer: Self-pay | Admitting: Internal Medicine

## 2010-11-16 ENCOUNTER — Other Ambulatory Visit: Payer: Self-pay | Admitting: Internal Medicine

## 2010-11-16 ENCOUNTER — Encounter: Payer: Self-pay | Admitting: Internal Medicine

## 2010-11-16 ENCOUNTER — Ambulatory Visit (INDEPENDENT_AMBULATORY_CARE_PROVIDER_SITE_OTHER): Payer: Medicare Other | Admitting: Internal Medicine

## 2010-11-16 DIAGNOSIS — C4442 Squamous cell carcinoma of skin of scalp and neck: Secondary | ICD-10-CM

## 2010-11-16 DIAGNOSIS — E785 Hyperlipidemia, unspecified: Secondary | ICD-10-CM

## 2010-11-16 DIAGNOSIS — I10 Essential (primary) hypertension: Secondary | ICD-10-CM

## 2010-11-16 DIAGNOSIS — Z0000 Encounter for general adult medical examination without abnormal findings: Secondary | ICD-10-CM

## 2010-11-16 DIAGNOSIS — I739 Peripheral vascular disease, unspecified: Secondary | ICD-10-CM

## 2010-11-16 NOTE — Progress Notes (Signed)
Subjective:    Patient ID: Joshua Brown, male    DOB: 1945/07/26, 65 y.o.   MRN: [id number]  HPI The patient is here for annual Medicare wellness examination and management of other chronic and acute problems. He reports that he is feeling well and doing well. He admits that he has not been exercising on a regular basis. He has also stopped taking Zetia due to price issues.    The risk factors are reflected in the social history.  The roster of all physicians providing medical care to patient - is listed in the Snapshot section of the chart.  Activities of daily living:  The patient is 100% inedpendent in all ADLs: dressing, toileting, feeding as well as independent mobility. 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   Home safety : The patient has smoke detectors in the home. They wear seatbelts.No firearms at home. There is no violence in the home. They have had no falls and have no fall risk.   There is no risks for hepatitis, STDs or HIV. There is no   history of blood transfusion. They have no travel history to infectious disease endemic areas of the world.  The patient has seen their dentist in the last six month. They have seen their eye doctor in the last year. They deny any hearing difficulty and have not had audiologic testing in the last year.  They do not  have excessive sun exposure. Discussed the need for sun protection: hats, long sleeves and use of sunscreen if there is significant sun exposure.   Diet: the importance of a healthy diet is discussed. They do have a healthy (unhealthy-high fat/fast food) diet.  Exercise - no active exercise program.  Past Medical History  Diagnosis Date  . Myocardial infarct, old   . Hypertension   . ED (erectile dysfunction)   . Hyperglycemia     Borderline  . DDD (degenerative disc disease)   . Obesity     Class I  . Chronic LBP   . AAA (abdominal aortic aneurysm)   . PVD (peripheral vascular  disease)   . Frequent urination at night   . GERD (gastroesophageal reflux disease)   . Hyperlipidemia     Atherogenic  . CAD (coronary artery disease)     premature   Past Surgical History  Procedure Date  . Coronary artery bypass graft   . Tonsillectomy     age 41   Family History  Problem Relation Age of Onset  . Cancer Mother     Jaw Cancer  . Hyperlipidemia Mother   . Heart disease Mother     CHF  . Early death Father   . Heart disease Father     CAD/MI  . Cancer Brother     prostate  . Heart disease Paternal Grandfather     CAD/MI   History   Social History  . Marital Status: Married    Spouse Name: N/A    Number of Children: 3  . Years of Education: N/A   Occupational History  . [employer]     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   Social History Main Topics  . Smoking status: Former Smoker -- 1.0 packs/day for 16 years    Types: Cigarettes    Quit date: 07/05/1974  . Smokeless tobacco: Not on file  . Alcohol Use:   . Drug Use:   . Sexually Active:    Other Topics Concern  .  Not on file   Social History Narrative   [employer] 1 yearMarried 16[address] son - 71; 2 daughters - 71, 68; 2 grandchildrenMarriage in good health         Review of Systems Review of Systems  Constitutional:  Negative for fever, chills, activity change and unexpected weight change.  HENT:  Negative for hearing loss, ear pain, congestion, neck stiffness and postnasal drip.   Eyes: Negative for pain, discharge and visual disturbance.  Respiratory: Negative for chest tightness and wheezing.   Cardiovascular: Negative for chest pain and palpitations.       [No decreased exercise tolerance Gastrointestinal: [No change in bowel habit. No bloating or gas. No reflux or indigestion Genitourinary: Negative for urgency, frequency, flank pain and difficulty urinating.  Musculoskeletal: Negative for myalgias, back pain, arthralgias and gait problem.  Neurological: Negative for  dizziness, tremors, weakness and headaches.  Hematological: Negative for adenopathy.  Psychiatric/Behavioral: Negative for behavioral problems and dysphoric mood.       Objective:   Physical Exam Constitutional: He is oriented to person, place, and time. He appears well-developed and well-nourished.       Healthy appearing white male in no acute distress  HENT:  Head: Normocephalic and atraumatic.  Right Ear: External ear normal. EAC/TM nl Left Ear: External ear normal.  EAC/TM nl Nose: Nose normal.  Mouth/Throat: Oropharynx is clear and moist.  Eyes: Conjunctivae and EOM are normal. Pupils are equal, round, and reactive to light. Right eye exhibits no discharge. Left eye exhibits no discharge. No scleral icterus.  Neck: Normal range of motion. Neck supple. No JVD present. No tracheal deviation present. No thyromegaly present.  Cardiovascular: Normal rate, regular rhythm and normal heart sounds.  Exam reveals no gallop and no friction rub.   No murmur heard.      Quiet precordium. 2+ radial and DP pulses  Pulmonary/Chest: Effort normal. No respiratory distress. He has no wheezes. He has no rales. He exhibits no tenderness.       No chest wall deformity  Abdominal: Soft. Bowel sounds are normal. He exhibits no distension. There is no tenderness. There is no rebound and no guarding.       No heptosplenomegaly  Genitourinary: Penis normal.       Normal bilaterally descended testicles with no scrotal masses  Musculoskeletal: Normal range of motion. He exhibits no edema and no tenderness.       Small and large joints without redness, synovial thickening or deformity. Full range of motion preserved about all small, median and large joints.  Lymphadenopathy:    He has no cervical adenopathy.  Neurological: He is alert and oriented to person, place, and time. He has normal reflexes. No cranial nerve deficit. Coordination normal.  Skin: Skin is warm and dry. No rash noted. No erythema.    Psychiatric: He has a normal mood and affect. His behavior is normal. Thought content normal. Ccognitively normal.  Lab Results  Component Value Date   WBC 7.3 11/13/2009   HGB 16.0 11/13/2009   HCT 46.6 11/13/2009   PLT 163.0 11/13/2009   CHOL 149 06/17/2010   TRIG 76.0 06/17/2010   HDL 33.10* 06/17/2010   ALT 29 06/17/2010   AST 27 06/17/2010   NA 141 06/17/2010   K 4.1 06/17/2010   CL 105 06/17/2010   CREATININE 1.1 06/17/2010   BUN 12 06/17/2010   CO2 30 06/17/2010   TSH 2.67 11/04/2008   PSA 0.47 11/13/2009   INR 1.1 RATIO*  05/16/2008   HGBA1C 6.2 11/13/2009   Lab Results  Component Value Date   LDLCALC 101* 06/17/2010          Assessment & Plan:  1. Hypertension - good control on present medications. Last lab with normal renal function and electrolytes  2. Hyperlipidemia - patient has had a 20 point increase due to cessation of zetia. Discussed the reduction in risk of progressive disease if LDL is 80 or less, which is where he was in May '11g  Plan - restart zetia 10mg  qd; continue with lipitor 80 mg daily.  3. CAD - he has been doing well. He is scheduled to see Dr. Excell Seltzer in the near future.  4. PVD - good PT pulse and no evidence of skin breakdown. No report of limiting claudication.  Plan- continue present medical regimen           Daily exercise  5. Derm - small scaly lesion on right vertex scalp. He also has several skin tags in the axilla.   Procedure Note :     Procedure :  Skin biopsy   Indication:   Suspicious lesion(s)   Risks including unsuccessful procedure , bleeding, infection, bruising, scar, a need for another complete procedure and others were explained to the patient in detail as well as the benefits. Informed consent was obtained and signed.   The patient was placed in a decubitus position.  Lesion #1 on scalp, right vertex   measuring    3 mm   Skin over lesion #1  was prepped with Betadine and alcohol  and anesthetized with 1 cc of  2% lidocaine and epinephrine, using a 25-gauge 1 inch needle.  Shave biopsy with a sterile Dermablade was carried out in the usual fashion. It was attempted to biopsy with  1 mm free margins.  Hyfrecator was used to destroy the rest of the lesion potentially left behind and for hemostasis.     Lesion #2 axilla skin tags Skin over lesion #2  was prepped with Betadine and alcohol. Anesthesia with ethylene chloride spray.  Shave excision with a sterile Dermablade was carried out in the usual fashion.Lesion #3 on    measuring   mm  You need to report immediately  if fever, chills or any signs of infection develop.   6. Health Maintenance - interval history is negative. Physical exam is normal. Lab results from December '11 are in normal limits except for slight bump in LDL cholesterol. Current with colorectal cancer screening - Dr. Kinnie Scales. Current with prostate cancer screening with PSA May '11 and May '10 0.47. Discussed the controversy on screening and that yearly testing is not necessary if we do screen. Immunizations current for tetnus, pneumonia and and shingles.   In summary - a very nice man who is encouraged to resume a regular program of aerobic exercise and flex/stretch exercise. He is encouraged to read "Younger Next Year" for inspiration. He is medically stable. He will return for lab in 1 month. He will return to clinic prn or in 1 year.

## 2010-11-17 NOTE — Progress Notes (Signed)
Joshua Brown                        PERIPHERAL VASCULAR OFFICE NOTE   Joshua Brown, Joshua Brown                        MRN:          [id number]  DATE:08/15/2008                            DOB:          02-24-1946    REASON FOR VISIT:  Lower extremity PAD.   HISTORY OF PRESENT ILLNESS:  Joshua Brown is a 65 year old gentleman with  coronary and peripheral arterial disease.  He underwent coronary artery  bypass surgery in the 1980s.  He underwent a recent cardiac  catheterization that showed stable CAD and he has continued with medical  management.  He is followed here for distal abdominal aortic stenosis.  This was discovered on abdominal aortic ultrasound.  His most recent  ABIs were in June 2009 and were 0.81 on the right and 1.0 on the left.  He has velocities in the abdominal aorta of approximately 3 m/sec,  consistent with moderate distal aortic stenosis.  He is asymptomatic.  He specifically denies claudication symptoms, rest pain, or ischemic  ulcerations.  He is able to walk up to 2 miles without pain in his legs.   He complains of a cough over the last few months.  He is inquiring about  a chest x-ray as he has not had one done in some time.  His cough has  been persistent and nonproductive.  He denies shortness of breath,  fever, or chills.  He also complains of erectile dysfunction over the  last several months.   CURRENT MEDICATIONS:  1. Omeprazole 20 mg daily.  2. Multivitamin 1 daily.  3. Plavix 75 mg daily.  4. Toprol 50 mg daily.  5. Lipitor 80 mg daily.  6. Zetia 10 mg daily.  7. Folic acid 1 daily.  8. Aspirin 81 mg daily.  9. Vitamin D daily.  10.Cozaar 100 mg one-third daily.  11.Niacin 2 g daily.   ALLERGIES:  NKDA.   PHYSICAL EXAMINATION:  GENERAL:  The patient is alert and oriented.  He  is in no acute distress.  VITAL SIGNS:  Weight is 195 pounds, blood pressure 134/80, heart rate  72, and respiratory rate 12.  HEENT:   Normal.  NECK:  Normal carotid upstrokes with a left carotid bruit.  JVP is  normal.  LUNGS:  Clear bilaterally.  HEART:  Regular rate and rhythm.  No murmurs or gallops.  ABDOMEN:  Soft and nontender.  No organomegaly.  EXTREMITIES:  No clubbing, cyanosis, or edema.  Pedal pulses are 2+ and  equal bilaterally.  There are no femoral bruits.   STUDIES REVIEWED:  1. Carotid duplex scan on January 02, 2008 showed mild plaque in both      wall 0-39% bilateral ICA stenosis.  2. Lower extremity ABI on January 02, 2008, 0.81 on the right and 1.0 on      the left.  Suprarenal an infrarenal aortic velocities approximately      3 m/sec.   ASSESSMENT:  1. Distal abdominal aortic stenosis.  The patient remains      asymptomatic.  Continue secondary risk reduction measures and      walking  program.  He maintains on antiplatelet therapy with aspirin      and Plavix as well as aggressive treatment of his hypertension and      dyslipidemia.  2. Erectile dysfunction.  He was written a prescription for Cialis 10      mg daily.  He was warned about the contraindication with nitrates.      He does not take nitroglycerin or any long-acting nitrates.  3. Persistent cough.  A chest x-ray was ordered.  My preliminary      review shows clear lung fields with no abnormalities noted.   For followup, I would like to see Joshua Brown back in 1 year.  I would like  to see him back in 1 year with followup abdominal ultrasound and ABIs  this summer.     Joshua Fells. Excell Seltzer, MD  Electronically Signed    MDC/MedQ  DD: 08/15/2008  DT: 08/15/2008  Job #: [id number]

## 2010-11-17 NOTE — Progress Notes (Signed)
[facility]                        PERIPHERAL VASCULAR OFFICE NOTE   Joshua Brown, Joshua Brown                        MRN:          [id number]  DATE:08/24/2007                            DOB:          07-14-1945    PRIMARY CARE PHYSICIAN:  Rosalyn Gess. Norins, M.D.   Joshua Brown returns for followup at the [facility]  office on August 24, 2007.  Joshua Brown is a 65 year old gentleman with  coronary and peripheral arterial disease.  He underwent screening  abdominal aortic ultrasound that demonstrated stenosis in the distal  abdominal aorta as well as his bilateral iliac arteries.  He has  previously been asymptomatic and has had normal ABIs.  He was initially  referred for evaluation back in July of 2008.   Joshua Brown is still doing well from a symptomatic standpoint.  However, he  has developed some buttock pain with walking.  He is able to walk 2  miles without stopping.  He denies pain in the thighs or calves with  walking.  He has had some back problems and is unsure of whether this is  related to his buttock pain or whether it may be due to his vascular  problems.  He has had no abdominal pain, chest pain, dyspnea, or other  complaints.  He has been less active and has gained some weight since  his last visit.   MEDICATIONS:  1. Omeprazole 20 mg daily.  2. Plavix 75 mg daily.  3. Toprol-XL 50 mg daily.  4. Lipitor 80 mg daily.  5. Niacin 2 grams daily.  6. Zetia 10 mg daily.  7. Cozaar daily.  8. Folic acid daily.  9. Aspirin 81 mg daily.   ALLERGIES:  NO KNOWN DRUG ALLERGIES.   PHYSICAL EXAMINATION:  GENERAL:  The patient is alert and oriented.  He  is in no acute distress.  VITAL SIGNS:  Weight 188.  Blood pressure 132/81, heart rate 71,  respiratory rate 16.  HEENT:  Normal.  NECK:  Normal carotid upstrokes without bruits.  Jugular venous pressure  is normal.  LUNGS:  Clear bilaterally.  HEART:  Regular rate and rhythm  without murmurs or gallops.  ABDOMEN:  Soft, nontender, obese, no organomegaly.  There is an  abdominal bruit just to the right of the umbilical area.  EXTREMITIES:  Femoral pulses are 2+ with soft bilateral bruits.  Pedal  pulses are 2+ and equal.  SKIN:  Warm and dry without rash.   ASSESSMENT:  Joshua Brown remains stable regarding his peripheral arterial  disease.  He has distal abdominal aortic stenosis, and he has moderate  iliac stenosis.  His buttock pain could possibly be related to his  vascular disease, but his symptoms are really minimal, as he is able to  walk 2 miles uninterrupted.  I would like to follow him up with an  abdominal ultrasound and ABIs again next summer for his 1-year followup.  I will plan on seeing him back on a yearly basis.  He remains on an  excellent medical regimen for his peripheral arterial disease.  Veverly Fells. Excell Seltzer, MD  Electronically Signed    MDC/MedQ  DD: 08/24/2007  DT: 08/25/2007  Job #: [id number]   cc:   Rosalyn Gess. Norins, MD  Everardo Beals. Juanda Chance, MD, [facility]

## 2010-11-17 NOTE — Cardiovascular Report (Signed)
Joshua Brown, Joshua Brown                 ACCOUNT NO.:  [id number]   MEDICAL RECORD NO.:  [id number]          PATIENT TYPE:  OIB   LOCATION:  1961                         FACILITY:  [facility]   PHYSICIAN:  Bruce R. Juanda Chance, MD, FACCDATE OF BIRTH:  02/13/1946   DATE OF PROCEDURE:  05/24/2008  DATE OF DISCHARGE:                            CARDIAC CATHETERIZATION   HISTORY OF PRESENT ILLNESS:  [name] is 65 years old and had a heart  attack at age 69.  In 1987, he had bypass surgery by Dr. Andrey Campanile.  Recently, he has developed some mild symptoms of shortness of breath and  was concerned and cut back on his exercise.  I saw him and I evaluated  him in the office and made decision to evaluate him with angiography.   PROCEDURE:  The procedure was performed by the right femoral artery  arterial sheath and 4-French pyriform coronary catheters.  A front wall  arterial puncture was performed, and Omnipaque contrast was used.  A 3-D  right catheter was used for injection of the LIMA graft.  Right bypass  graft catheter was used for injection of the vein graft to the right  coronary.  The patient tolerated the procedure well and left the  laboratory in satisfactory condition.   RESULTS:  The left main coronary artery.  The left main coronary artery  is completely occluded at its origin.  This was a fresh occlusion.   The right coronary artery.  The right coronary artery was completely  occluded proximally.  There are bridging collaterals which filled the  mid-right coronary artery.  The right coronary artery was then occluded  in its distal portion after a right ventricular branch.  The distal  portion of the vein graft filled the posterior descending branch through  the connection to the right ventricular branch.  This was a moderately  small vessel.   The saphenous vein graft to the marginal branch of circumflex was patent  and functioning normally.  There did not appear to be any major plaque.  There  was an 80% narrowing at the ostium of this marginal branch and in  the AV branch which threatened this small posterolateral branch.   The LIMA graft to LAD with patent and functioned normally.   The left ventriculogram was performed in the RAO projection showed  slight hypokinesis of the inferobasal wall.  The estimated ejection  fraction was 60%.   The right coronary artery graftwas completely occluded at its origin.   The aortic pressure was 127/69 with mean of 93 and left ventricular  pressure was 127/23.   CONCLUSION:  1. Coronary artery disease status post coronary artery bypass graft      surgery in 1987.  2. Severe native vessel disease with total occlusion of the left main      and total occlusion of the right coronary artery with bridging      collaterals filling the mid-right coronary artery and subsequent      filling of the posterior descending branch through the distal limb      of the saphenous  vein graft to its connection to the right      ventricular branch.  3. Patent vein graft to the marginal branch of circumflex artery,      patent LIMA graft to LAD, and occluded vein graft to the right      coronary.  4. Inferobasal wall hypokinesis and overall with an ejection fraction      of 60%.   RECOMMENDATIONS:  The patient's coronary anatomy has not changed since  2002.  He has fairly well-developed collaterals to the distal right  coronary.  The small posterolateral branches extensively jeopardized,  but this has not changed before.  I think we can reassure him that his  circulation is fairly well preserved, and we can encourage him to resume  his exercise program.      Bruce R. Juanda Chance, MD, [facility]  Electronically Signed     BRB/MEDQ  D:  05/24/2008  T:  05/24/2008  Job:  [id number]   cc:   Rosalyn Gess. Norins, MD

## 2010-11-17 NOTE — Assessment & Plan Note (Signed)
[facility]                            CARDIOLOGY OFFICE NOTE   Joshua Brown, Joshua Brown                        MRN:          [id number]  DATE:04/26/2007                            DOB:          [birth year]    PRIMARY CARE PHYSICIAN:  Dr. Illene Regulus.   CLINICAL HISTORY:  [name] is 65 years old and had been followed by Dr.  Corinda Gubler since he had a heart attack at age 1 in 90.  I am seeing him  for the first time as his primary cardiologist on referral from Dr.  Corinda Gubler.  He had bypass surgery by Dr. Andrey Campanile in 1987, and last  catheterization was performed in 2002 by myself at which time he had a  patent LIMA to the LAD, patent vein graft to the circumflex artery and  occluded vein graft to the right coronary artery, which filled via  collaterals.  He had inferobasilar wall hypokinesis.  He had a stress  test done recently which showed some inferior scarring with good overall  LV function and question of mild inferior ischemia.   He says he has been doing quite well from a cardiac standpoint.  He has  had no chest pain, shortness of breath or palpitations.  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  He is  also involved with the [place] community and saw Dr. Corinda Gubler at a  function recently.   PAST MEDICAL HISTORY:  Significant for hypertension and hyperlipidemia.  He also has peripheral vascular disease and was recently seen by Dr.  Excell Seltzer on referral from Dr. Corinda Gubler.  He was found to have distal aortic  stenosis and moderate bilateral iliac stenosis by ultrasound, which is  asymptomatic, and Dr. Excell Seltzer just recommended followup in six months.   CURRENT MEDICATIONS:  Prilosec, Plavix, aspirin, Toprol, Lipitor,  niacin, Zetia, Cozaar.   PHYSICAL EXAMINATION:  VITAL SIGNS:  Blood pressure 117/75, pulse 76 and  regular.  NECK:  There was no venous distention.  Carotid pulses were full without  bruits.  CHEST:   Clear without rales or rhonchi.  HEART:  Cardiac rhythm was regular.  Heart sounds were normal with no  murmurs or gallops.  ABDOMEN:  Soft with normal bowel sounds.  There is no  hepatosplenomegaly.  EXTREMITIES:  Peripheral pulses are full.  There is no peripheral edema.   Electrocardiogram was normal with 1 PVC.   IMPRESSION:  1. Coronary artery disease status post coronary bypass graft surgery      in 1987 with coronary anatomy as described above and recent low      risk Myoview scan.  2. Hypertension.  3. Hyperlipidemia.  4. Peripheral vascular disease with aorto and iliac stenosis.   RECOMMENDATIONS:  I think Mr. Curro is stable from a cardiac standpoint  and doing well.  He is on maximum lipid therapy and his lipid profile is  pretty good, although his LDL is not quite at target.  I encouraged him  to get his weight down and continue exercise program.  He  has been  limited by some low back pain recently.  We gave him some Xanax for  sleep and to take until he can see Dr. Debby Bud.  I will plan to see him  back in followup in a year.     Bruce Elvera Lennox Juanda Chance, MD, [facility]  Electronically Signed    BRB/MedQ  DD: 04/26/2007  DT: 04/27/2007  Job #: [id number]

## 2010-11-17 NOTE — Progress Notes (Signed)
[facility]                        PERIPHERAL VASCULAR OFFICE NOTE   Joshua Brown, Joshua Brown                        MRN:          [id number]  DATE:01/27/2007                            DOB:          1946-05-02    REQUESTING PHYSICIAN:  Cecil Cranker, MD, [facility]   REASON FOR CONSULTATION:  Aorto-iliac stenosis.   Joshua Brown is a 65 year old gentleman who has been followed in our  practice for many years.  He has a longstanding history of coronary  artery disease, suffering his first myocardial infarction in his 41s.  He underwent coronary bypass surgery at 39.  He has been followed by Glennon Hamilton, and his cardiac care is going to be taken over by Charlies Constable.  He recently underwent an abdominal aortic ultrasound, which was reported  on July 2 and showed moderately severe stenosis of the distal abdominal  aorta with elevated velocities in that area, with a peak systolic  velocity of 392 cm per second.  The proximal common iliac arteries also  were noted to be stenotic with velocities higher on the right side than  the left.  Following the study, ABIs were performed and were 1.0 in both  legs.   From a symptoms standpoint, [name] is doing very well.  He has not  been active recently, due to back problems.  Prior to January of this  year, he was quite active and exercised for at least 30 minutes  regularly on an elliptical machine and did light weightlifting.  He  currently performs frequent walking, and he is asymptomatic.  He denies  any hip, thigh or buttock pain.  He has no calf pain.  He has no history  of tissue loss in the feet.  He has never had a stroke or TIA.   CURRENT MEDICATIONS:  Omeprazole, vitamin D, multivitamin, Plavix 75 mg  daily, Toprol XL 50 mg daily, Lipitor 80 mg daily, Niacin 2 grams daily,  Zetia 10 mg daily, Cozaar daily, aspirin 81 mg daily.   ALLERGIES:  NKDA.   PAST MEDICAL HISTORY:  Pertinent for coronary artery  disease with remote  myocardial infarction, as described.  He underwent coronary bypass  surgery by Dr. Andrey Campanile 21 years ago.  Other medical problems include  dyslipidemia, peripheral arterial disease as detailed above,  gastroesophageal reflux disease, and hypertension.  He has had a remote  tonsillectomy.   SOCIAL HISTORY:  The patient is married.  He has 3 children.  He lives  locally and works in Research [employer], political party.  He is a remote smoker and quit in his  85s.  He drinks alcohol socially.   FAMILY HISTORY:  His mother died at 73 of congestive heart failure.  His  father died at age 83 from a hemorrhagic stroke.  He has a brother who  has some prostate problems but no vascular disease.   REVIEW OF SYSTEMS:  A complete 12-point review of systems was performed.  Pertinent positives included fatigue, gastroesophageal reflux, low back  pain and rare dyspnea.   PHYSICAL EXAMINATION:  GENERAL:  The patient is alert  and oriented.  He  is in no acute distress.  VITAL SIGNS:  Weight is 181 pounds.  Blood pressure is 120/68 in the  right arm, 110/70 in the left arm, heart rate 68, respiratory rate 16.  HEENT:  Normal.  NECK:  Normal carotid upstrokes without bruits.  Jugular venous pressure  is normal.  LUNGS:  Clear to auscultation bilaterally.  HEART:  The apex is discreet and nondisplaced.  The heart is regular  rate and rhythm without murmurs or gallops.  ABDOMEN:  Soft, nontender, no organomegaly, no abdominal bruits.  The  abdominal aorta is palpable and does not feel enlarged.  BACK:  There is no CVA tenderness.  EXTREMITIES:  Femoral pulses are 2+ and equal without bruits.  Pedal  pulses are 2+ and easily palpable in the dorsalis pedis and posterior  tibial positions bilaterally.  SKIN:  Warm and dry without rash.  There are no ulcers.  NEUROLOGIC:  Cranial nerves 2-12 are intact.  Strength is 5/5 and equal  in the arms and legs bilaterally.   ASSESSMENT:  [name] is a 65 year old  gentleman with peripheral  arterial disease.  Specifically, he has distal aortic stenosis with  moderate bilateral iliac stenosis.  He is asymptomatic.  Because of his  asymptomatic status, he really warrants no further testing at this  point.  We could always perform a CT angiogram, which is an excellent  imaging modality for aorto-iliac disease and can well delineate his  anatomy.  However, with the lack symptoms, I would not be inclined even  to do this, as he does not warrant consideration of endovascular  intervention or surgical intervention.  His ABIs are normal, and he is  able to walk without any limitations.  I think he should be followed  regularly, and I would like to see him back in 6 months, to examine him  and assess for any change in symptoms.  If he is stable at that point,  his peripheral arterial disease can be followed on a yearly basis.   He should continue with aggressive secondary risk factor reduction as he  is doing and will continue under the care of Dr. Debby Bud and Juanda Chance.     Veverly Fells. Excell Seltzer, MD  Electronically Signed    MDC/MedQ  DD: 01/27/2007  DT: 01/28/2007  Job #: [id number]   cc:   Rosalyn Gess. Norins, MD  Everardo Beals. Juanda Chance, MD, [facility]

## 2010-11-17 NOTE — Assessment & Plan Note (Signed)
[facility]                            CARDIOLOGY OFFICE NOTE   Joshua Brown, Joshua Brown                        MRN:          [id number]  DATE:05/16/2008                            DOB:          Jul 23, 1945    PRIMARY CARE PHYSICIAN:  Rosalyn Gess. Norins, MD   CLINICAL HISTORY:  Joshua Brown is a 65 year old returned for followup  management of his coronary heart disease.  He had a heart attack at age  32 in 72 outside of [place] and came back and saw Dr. Corinda Gubler as  his cardiologist.  His cousin is Dr. Curly Shores who is deceased.  He  subsequently had bypass surgery by Dr. Andrey Campanile in 1987.  His last  catheterization was performed in 2002 at which time the vein graft to  the right was occluded, his LIMA to LAD was patent, his vein graft to  the marginal to the circumflex was patent, and his EF was 55%.  He has  done fairly well, but he says he has gained a lot of weight about 17  pounds over the last year and half.  He also says that he has been  somewhat afraid to exercise and gives out more easily than he used to.  He also had some substernal chest discomfort, which is not particularly  related to exercise.   He is also seen Dr. Excell Seltzer for peripheral vascular disease.  He has  distal abdominal aortic stenosis and moderate iliac stenosis.   PAST MEDICAL HISTORY:  Significant for hypertension and hyperlipidemia.   CURRENT MEDICATIONS:  1. Prilosec.  2. Plavix.  3. Aspirin.  4. Toprol 50 mg daily.  5. Lipitor 80 mg daily.  6. Niacin 2 g daily.  7. Zetia 10 mg daily.  8. Cozaar 33 mg daily.  9. Folic acid.  10.Vitamin D.   PHYSICAL EXAMINATION:  VITAL SIGNS:  The blood pressure is 140/75.  The  pulse 65 and regular.  NECK:  There is no venous tension.  The carotid pulses are full without  bruits.  CHEST:  Clear.  CARDIAC:  Rhythm is regular.  I can hear no murmurs, rubs, or gallops.  ABDOMEN:  Soft with normal bowel sounds.  There is no  hepatosplenomegaly.  EXTREMITIES:  Peripherals are full and no peripheral edema.   Electrocardiogram showed sinus rhythm and inferior T-wave changes and  PVCs.   IMPRESSION:  1. Coronary artery disease, status post coronary bypass graft surgery      in 1987 with coronary as described above.  2. Exertional shortness of breath and nonexertional chest pain,      question ischemia.  3. Good left ventricular function.  4. Hypertension.  5. Hyperlipidemia.  6. Peripheral vascular disease with distal aortic stenosis and iliac      stenosis.  7. Hyperlipidemia.  8. Excess weight.   RECOMMENDATIONS:  Mr. Bail has had decreased exercise tolerance and some  chest pain.  His chest pain does not sound typical for ischemia, but he  is quite concerned about his overall problem.  I think a stress test  will  be somewhat difficult to interpret because of his occlusion of the  vein graft to the right and I think evaluation of angiography is the  best approach.  He is quite agreeable to this and was scheduled to come  in next week.  Hopefully, we can get involve an exercise program and a  weight reduction program after we do the catheterization procedure.     Bruce Elvera Lennox Juanda Chance, MD, [facility]  Electronically Signed    BRB/MedQ  DD: 05/16/2008  DT: 05/16/2008  Job #: [id number]

## 2010-11-17 NOTE — Assessment & Plan Note (Signed)
[facility]                            CARDIOLOGY OFFICE NOTE   Joshua Brown, Joshua Brown                        MRN:          [id number]  DATE:06/06/2008                            DOB:          1946-01-19    PRIMARY CARE PHYSICIAN:  Rosalyn Gess. Norins, MD   CLINICAL HISTORY:  Joshua Brown is a 65 years old who returned for  followup management of his coronary heart disease after his recent  catheterization.  He had a heart attack at age 4 and had bypass surgery  by Dr. Andrey Campanile in 1987.  He recently had some symptoms of mild chest  discomfort and was concerned about progression of disease in his heart  and underwent catheterization.  His coronary anatomy had not changed.  His LIMA to LAD was patent and his vein graft to circumflex artery was  patent and the vein graft to the right coronary was chronically  occluded, but he had good collaterals of the right coronary and good LV  function.   He has not had any problems since his catheterization and had no recent  chest pain, shortness breath, or palpitations.   PAST MEDICAL HISTORY:  Significant for abdominal aortic stenosis and  moderate iliac stenosis.  He also has a history of hypertension and  hyperlipidemia.   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  He has also been very involved  in the [employer].  He does not smoke.   CURRENT MEDICATIONS:  1. Prilosec.  2. Plavix 75 mg daily.  3. Toprol 50 mg daily.  4. Lipitor 80 mg daily.  5. Niacin 2 g daily.  6. Zetia 10 mg daily.  7. Cozaar 100 mg daily.  8. Folic acid.  9. Aspirin.   PHYSICAL EXAMINATION:  VITAL SIGNS:  Blood pressure is 126/71 and pulse  is 73 and regular.  NECK:  There is no venous distension.  The carotid pulses were full  without bruits.  CHEST:  Clear.  HEART:  Rhythm was regular.  No murmurs or gallops.  ABDOMEN:  Soft.  Normal bowel sounds.  EXTREMITIES:  Peripheral pulses were full.  There is no peripheral  edema.  The right femoral artery site had a small knot, but no widened  pulsation.   IMPRESSION:  1. Coronary artery disease, status post coronary bypass graft surgery      in 1987, with chronic occlusion of the vein graft to the right      coronary artery and patent vein graft to the circumflex and patent      left internal mammary artery graft to the left anterior descending      coronary artery.  2. Good left ventricular function.  3. Hypertension.  4. Hyperlipidemia.  5. Peripheral vascular disease with distal aortic stenosis and iliac      stenosis.  6. Excess weight.   RECOMMENDATIONS:  Joshua Brown cath turned out quite well.  His  circulation looks good except for totally occluded right, which is well  collateralized.  I encouraged him to resume regular exercise program.  His lipid profile has a good level of HDL and LDL, but he has increased  number of small particles.  I encouraged him to work on his weight  reduction since he is on maximal medical therapy for this.  We will plan  to see him back in followup in a year.  He is to see Dr. Excell Seltzer for his  peripheral vascular disease; I believe in February 2010.     Bruce Elvera Lennox Juanda Chance, MD, [facility]  Electronically Signed    BRB/MedQ  DD: 06/06/2008  DT: 06/07/2008  Job #: [id number]

## 2010-11-20 NOTE — Assessment & Plan Note (Signed)
[place] [facility]                            CARDIOLOGY OFFICE NOTE   BRISCOE, DANIELLO                        MRN:          [id number]  DATE:10/25/2006                            DOB:          [birth year]    Mr. Cutsforth is a very pleasant 65 year old white married male with long  history of coronary artery disease, having had initial myocardial  infarction at age 42 with subsequent coronary artery bypass graft  surgery by Dr. Andrey Campanile 21 years ago.  He has been getting along quite  well with no cardiac symptoms.  He has had hyperlipidemia, on therapy  with recent LDL of 62, HDL decreased to 30.  Glucose is 108. SGOT of 57.   Prior abdominal ultrasound revealed a dilatation of proximal abdominal  aorta at 2.5 cm x 2.5 cm. however within normal limits.  Most recent  stress echo June 11, 2004 revealed excellent wall motion.  There was  some inferior wall reversibility compatible with ischemia.   Catheterization by Dr. Juanda Chance in February 2002 revealed patent LIMA to  LAD, supplying collaterals to the distal RCA, saphenous vein graft to  the distal RCA was completed occluded.  Saphenous vein graft to the  marginal branch to the circumflex was patent and functioning normal;  however, marginal filled retrograde revealed the posterior and lateral  branch to the AV circumflex.  There was a 90% stenosis at the  bifurcation of the marginal, which threatened the posterolateral branch.   MEDICATIONS:  Include:  1. Omeprazole.  2. Vitamin D.  3. Plavix 75.  4. Toprol XL 50.  5. Lipitor 80.  6. Niacin 2 gm daily.  7. Zetia 10.  8. Cozaar 33 mg.  9. Folic acid.  10.Aspirin 81.  11.Cal-Mag 500.   PHYSICAL EXAMINATION:  Blood pressure 130/78.  Pulse 73.  Normal sinus  rhythm.  GENERAL APPEARANCE:  Normal.  JVP is not elevated.  Carotid pulses are palpable and equal without  bruits.  LUNGS:  Clear.  CARDIAC EXAM:  Reveals no murmur or gallop.  ABDOMINAL EXAM:   Normal.  EXTREMITIES:  Normal.  EKG is within normal limits.   IMPRESSION:  Diagnoses as above.   His HDL is low.  He has been exercising less recently because of low  back pain, which is now somewhat resolved.   I have reviewed his lab data with Shelby Dubin, and she agrees with his  present regimen.   I have suggested that he have abdominal ultrasound, stress Myoview,  follow up in the lipid clinic in 3 months with Shelby Dubin.  He will see  Dr. Juanda Chance in 6 months, or p.r.n.     E. Graceann Congress, MD, [facility]  Electronically Signed    EJL/MedQ  DD: 10/25/2006  DT: 10/25/2006  Job #: [id number]

## 2010-11-20 NOTE — Cardiovascular Report (Signed)
[place]  Patient:    Joshua Brown, Joshua Brown                        MRN: [id number] Proc. Date: 10/19/00 Adm. Date:  [id number] Disc. Date: [id number] Attending:  Lenoria Farrier CC:         Cecil Cranker, M.D. [facility]  Rosalyn Gess. Norins, M.D. [facility]  Cardiopulmonary Laboratory   Cardiac Catheterization  PROCEDURES PERFORMED:  Cardiac catheterization.  CLINICAL HISTORY:  Joshua Brown is 65 years old and had a previous diaphragmatic wall infarction when he was 65 years old, and subsequently had bypass surgery by Dr. Andrey Campanile in 1987.  Recently, he has had some mild exertional chest tightness and a Cardiolite scan which suggest inferior ischemia and he is brought in for evaluation.  DESCRIPTION OF PROCEDURE:  The procedure was performed via the right femoral artery using an arterial sheath and 6 French preformed coronary catheters.  A front wall arterial puncture was performed and Omnipaque contrast was used.  A LIMA catheter was used for injection of the vein graft.  A JR4 diagnostic catheter was used for injection of the graft to the circumflex artery.  A right bypass graft catheter was used for injection of the graft to the right coronary artery.  A distal aortogram was performed to rule out abdominal aortic aneurysm.  We performed a femoral arteriogram to see if Perclose was suitable, but felt it was not suitable because of peripheral vascular disease and a bifurcation near the entry point.  RESULTS:  The left main coronary artery:  The left main coronary artery was completely at its origin.  Right coronary artery:  The right coronary artery was completely occluded at its origin.  The saphenous vein graft to the marginal branch of the circumflex artery was patent and functioned normally.  However, the marginal filled retrograde and filled a posterolateral branch through the AV circumflex and there was 90% stenosis at the bifurcation of the marginal  which threatened the posterolateral branch.  The LIMA graft to the LAD was patent and functioned normally.  The LIMA supplied collaterals to the distal right coronary artery.  The saphenous vein graft to the distal right coronary artery was completely occluded.  There was a posterior descending and acute marginal branch which filled via collaterals from the left coronary system and a distal portion of the vein graft was patent which connected these two vessels.  There were also some right ventricular branches which filled via collaterals from the left coronary system.  LEFT VENTRICULOGRAPHY:  The left ventriculogram performed in the RAO projection showed good wall motion.  There was inferobasal wall hypokinesis. The estimated ejection fraction was 65%.  DISTAL AORTOGRAM:  Distal aortogram was performed which showed patent renal arteries.  There was 50% distal stenosis of the aorta.  There was questionable 70% stenosis of the right iliac vessel.  CONCLUSIONS:  Coronary artery disease, status post coronary artery bypass graft surgery in 1987 with total occlusion of the left main and right coronary arteries, patent left internal mammary artery graft to the left anterior descending, patent vein graft to the marginal branch of the circumflex artery with 90% stenosis in the posterolateral branch to the circumflex artery, and an occluded vein graft to the right coronary artery with inferobasal wall hypokinesis.  RECOMMENDATIONS:  The patient has total occlusion of the graft to the right coronary artery, although this area is filled fairly well by collaterals.  He also has potential ischemia in the posterolateral branch of the circumflex artery which is not grafted.  His LIMA and graft to the marginal branch are working well.  I think he can be managed medically and I think his outlook still should be reasonably good. DD:  10/19/00 TD:  10/20/00 Job: [id number]

## 2010-11-20 NOTE — Procedures (Signed)
[facility]  Patient:    Joshua Brown, Joshua Brown                        MRN: [id number] Proc. Date: 01/19/00 Adm. Date:  [id number] Attending:  Deneen Harts CC:         Rosalyn Gess. Norins, M.D. [facility]                           Procedure Report  PROCEDURE:  Panendoscopy with biopsy.  INDICATIONS:  A 65 year old white male with Barretts esophagus diagnosed endoscopically 15 months ago.  Undergoing repeat examination for surveillance. No interim difficulties other than occasional hacking cough.  Denies pyrosis, regurgitation, dysphagia.  Maintained on Prilosec 20 mg daily.  DESCRIPTION OF PROCEDURE:  After reviewing the nature of the procedure with the patient including potential risks and complications, and after discussing alternative methods of diagnosis and treatment, informed consent was signed.  The patient was premedicated receiving IV sedation totalling Versed 5 mg, fentanyl 50 mcg administered in divided doses prior to and during the course of the procedure.  Using an Olympus video endoscope, the proximal esophagus was intubated under direct vision.  Normal oropharynx and no lesion of the epiglottis, vocal cords, or piriform sinus.  Proximal and mid esophagus normal.  Distal esophagus notable for possible short segment Barretts esophagus at 36 cm. Large hiatal hernia extending from 37 to 41 cm, noninflamed.  No nodularity, inflammation, or stricture.  Hiatal hernia is noninflamed.  Gastric fundus, body, and antrum normal.  Pylorus symmetric.  Duodenal bulb and second portion normal.  Retroflex view of the angularis, lesser curve, and gastric cardia and fundus confirm the hiatal hernia defect but without other findings.  The stomach was decompressed and the scope withdrawn into the distal esophagus.  Circumferential biopsy obtained at the mucosal Z-line and submitted to pathology.  The esophagus was decompressed and scope withdrawn.  The patient  tolerated the procedure without difficulty being maintained on Datascope monitor and low-flow oxygen throughout.  ASSESSMENT: 1. Short segment Barretts esophagus - surveillance biopsy obtained. 2. Large hiatal hernia. 3. No evidence of acute mucosal inflammation.  RECOMMENDATIONS: 1. Follow up biopsy. 2. Nexium 40 mg p.o. q.a.m. 3. Review antireflux measures. 4. Repeat endoscopy in two years. DD:  01/19/00 TD:  01/19/00 Job: [id number]

## 2010-11-20 NOTE — Letter (Signed)
February 04, 2006     Dr. Lynelle Smoke I. [facility]  [facility]  [address] [place], [place] [address]   RE:  Joshua, Brown  MRN:  [id number]  /  DOB:  [birth year]   Dear Joshua Brown,   Thank you very much for seeing Joshua Brown, a pleasant, 65 year old,  Caucasian male for a questionable mass in the left scrotum.   Joshua Brown presented to the office on August 2 for evaluation of a small mass  in the left scrotum.  This has been discovered actually by his wife.  He has  had no trauma.  He reports no pain or discomfort.  He has had no fever, he  has had no dysuria, and he has had no other GU symptoms.  Patient has not  had any GU surgeries, specifically no vasectomy.   On examination, the patient is afebrile.  He has a normal penis.  Right  testicle was normal.  Left testicle did feel normal, but somewhat larger  than the right.  There was a soft, firm mass approximately 1 cm in diameter  at the upper pole of the testicle that was nontender.  He has slightly  enlarged vas.   Past medical history significant for bypass surgery secondary to coronary  artery disease, hyperlipidemia, GERD, nocturia, and osteoarthritis.   His current medications include Nexium 40 mg daily, Plavix 75 mg daily,  calcium with magnesium, CQ 10, Toprol 50 mg q.h.s., Lipitor 80 mg daily,  niacin 2 grams q.h.s., Cozaar 33 mg daily, folic acid 400 mg daily, aspirin  81 mg daily.   In general, the patient has been healthy and doing and doing well.  His last  full physical exam was 05/31/2005 at which time his examination was normal.  I did not appreciate any masses at that time.   If I can provide any additional information in regards to Joshua Brown, please  don't hesitate to contact me.   I appreciate your assistance in the evaluation of what I believe to be a  benign epididymal swelling.  I look forward to hearing from you.    Sincerely,      Joshua Gess. Norins, MD   MEN/MedQ  DD:   02/04/2006  DT:  02/04/2006  Job #:  [id number]

## 2010-12-10 ENCOUNTER — Other Ambulatory Visit: Payer: Self-pay | Admitting: Cardiology

## 2010-12-10 DIAGNOSIS — I739 Peripheral vascular disease, unspecified: Secondary | ICD-10-CM

## 2010-12-10 DIAGNOSIS — I700 Atherosclerosis of aorta: Secondary | ICD-10-CM

## 2010-12-11 ENCOUNTER — Encounter (INDEPENDENT_AMBULATORY_CARE_PROVIDER_SITE_OTHER): Payer: Medicare Other | Admitting: Cardiology

## 2010-12-11 DIAGNOSIS — I739 Peripheral vascular disease, unspecified: Secondary | ICD-10-CM

## 2010-12-11 DIAGNOSIS — I700 Atherosclerosis of aorta: Secondary | ICD-10-CM

## 2010-12-11 DIAGNOSIS — R0989 Other specified symptoms and signs involving the circulatory and respiratory systems: Secondary | ICD-10-CM

## 2010-12-15 ENCOUNTER — Encounter: Payer: Self-pay | Admitting: Cardiovascular Disease

## 2010-12-17 ENCOUNTER — Telehealth: Payer: Self-pay | Admitting: Cardiovascular Disease

## 2010-12-17 NOTE — Telephone Encounter (Signed)
Test results

## 2010-12-17 NOTE — Telephone Encounter (Signed)
Pt aware of vascular study results by phone.

## 2010-12-22 ENCOUNTER — Ambulatory Visit: Payer: Self-pay | Admitting: Cardiovascular Disease

## 2010-12-25 ENCOUNTER — Encounter: Payer: Self-pay | Admitting: Cardiovascular Disease

## 2010-12-25 ENCOUNTER — Ambulatory Visit (INDEPENDENT_AMBULATORY_CARE_PROVIDER_SITE_OTHER): Payer: Medicare Other | Admitting: Cardiovascular Disease

## 2010-12-25 DIAGNOSIS — E785 Hyperlipidemia, unspecified: Secondary | ICD-10-CM

## 2010-12-25 DIAGNOSIS — I70219 Atherosclerosis of native arteries of extremities with intermittent claudication, unspecified extremity: Secondary | ICD-10-CM

## 2010-12-25 DIAGNOSIS — I2510 Atherosclerotic heart disease of native coronary artery without angina pectoris: Secondary | ICD-10-CM

## 2010-12-25 DIAGNOSIS — I25810 Atherosclerosis of coronary artery bypass graft(s) without angina pectoris: Secondary | ICD-10-CM

## 2010-12-25 DIAGNOSIS — I739 Peripheral vascular disease, unspecified: Secondary | ICD-10-CM

## 2010-12-25 NOTE — Patient Instructions (Signed)
Your physician wants you to follow-up in: 1 year. You will receive a reminder letter in the mail two months in advance. If you don't receive a letter, please call our office to schedule the follow-up appointment.  Your physician has requested that you have a carotid duplex in 1 year. This test is an ultrasound of the carotid arteries in your neck. It looks at blood flow through these arteries that supply the brain with blood. Allow one hour for this exam. There are no restrictions or special instructions.  Your physician has requested that you have an abdominal aorta duplex in 1 year. During this test, an ultrasound is used to evaluate the aorta. Allow 30 minutes for this exam. Do not eat after midnight the day before and avoid carbonated beverages  Your physician recommends that you continue on your current medications as directed. Please refer to the Current Medication list given to you today.

## 2011-01-06 ENCOUNTER — Encounter: Payer: Self-pay | Admitting: Cardiovascular Disease

## 2011-01-06 NOTE — Assessment & Plan Note (Signed)
The patient is stable without angina. His electrocardiogram is normal. He is on appropriate medical regimen of aspirin, lipid-lowering therapy with atorvastatin, Plavix, losartan, and beta-blockade with long-acting metoprolol.

## 2011-01-06 NOTE — Assessment & Plan Note (Signed)
The patient has moderate aortoiliac stenosis. He has minimal symptoms we will continue to manage him medically. He was counseled regarding a walking program with continued efforts at dietary changes. His medical program includes aspirin, atorvastatin, and losartan.

## 2011-01-06 NOTE — Assessment & Plan Note (Signed)
The patient is on statin therapy with high-dose Lipitor. He is also unsteady a period LDL is 101. Discussed dietary modification and exercise.

## 2011-01-06 NOTE — Progress Notes (Signed)
HPI:  This is a 65 year old gentleman presented for follow up evaluation. The patient has a history of coronary artery disease status post remote coronary bypass surgery. He also has peripheral arterial disease with moderate aortoiliac stenosis. The patient has had minimal claudication and has been followed medically. He exercises only on a sporadic basis. He has very mild bilateral calf claudication symptoms. He denies chest pain, dyspnea, edema, orthopnea, or PND. The patient has no lightheadedness or syncope.  A recent aortoiliac duplex was performed demonstrating a peak systolic velocity in the distal abdominal aorta of 328 cm/s, this is stable from previous studies. The patient's ABIs are 0.9 bilaterally.  Outpatient Encounter Prescriptions as of 12/25/2010  Medication Sig Dispense Refill  . ALPRAZolam (XANAX) 0.5 MG tablet Take 0.5 mg by mouth every 8 (eight) hours as needed.        Marland Kitchen aspirin 81 MG EC tablet Take 81 mg by mouth daily.        Marland Kitchen atorvastatin (LIPITOR) 80 MG tablet Take 80 mg by mouth daily.        Marland Kitchen b complex vitamins capsule Take 1 capsule by mouth daily.        . Cholecalciferol (VITAMIN D) 2000 UNIT CAPS Take 2,000 capsules by mouth daily.        . clopidogrel (PLAVIX) 75 MG tablet Take 75 mg by mouth daily.        Marland Kitchen ezetimibe (ZETIA) 10 MG tablet Take 10 mg by mouth daily.        Marland Kitchen losartan (COZAAR) 100 MG tablet Take 100 mg by mouth as directed.        . metoprolol (TOPROL-XL) 100 MG 24 hr tablet Take 50 mg by mouth daily.        . niacin (NIASPAN) 500 MG CR tablet Take 2,000 mg by mouth at bedtime.        . pantoprazole (PROTONIX) 40 MG tablet Take 40 mg by mouth daily.        . tadalafil (CIALIS) 10 MG tablet Take 10 mg by mouth daily as needed.        . Testosterone (AXIRON) 30 MG/ACT SOLN Place 2 application onto the skin daily.          No Known Allergies  Past Medical History  Diagnosis Date  . Myocardial infarct, old   . Hypertension   . ED (erectile  dysfunction)   . Hyperglycemia     Borderline  . DDD (degenerative disc disease)   . Obesity     Class I  . Chronic LBP   . AAA (abdominal aortic aneurysm)   . PVD (peripheral vascular disease)   . Frequent urination at night   . GERD (gastroesophageal reflux disease)   . Hyperlipidemia     Atherogenic  . CAD (coronary artery disease)     premature    ROS: Negative except as per HPI  BP 118/68  Pulse 72  Ht 5\' 7"  (1.702 m)  Wt 181 lb 6.4 oz (82.283 kg)  BMI 28.41 kg/m2  PHYSICAL EXAM: Pt is alert and oriented, mildly overweight male in NAD HEENT: normal Neck: JVP - normal, carotids 2+= without bruits Lungs: CTA bilaterally CV: RRR without murmur or gallop Abd: soft, NT, Positive BS, no hepatomegaly Ext: no C/C/E, distal pulses intact and equal Skin: warm/dry no rash  EKG: normal sinus rhythm 72 beats per minute, within normal limits   ASSESSMENT AND PLAN:

## 2011-02-18 ENCOUNTER — Other Ambulatory Visit: Payer: Self-pay | Admitting: Cardiovascular Disease

## 2011-03-03 ENCOUNTER — Telehealth: Payer: Self-pay | Admitting: Cardiovascular Disease

## 2011-03-03 MED ORDER — EZETIMIBE 10 MG PO TABS: 10.0000 mg | ORAL_TABLET | Freq: Every day | ORAL | Status: DC

## 2011-03-03 NOTE — Telephone Encounter (Signed)
Pt needs refill of zetia 10 mg [facility] [address]

## 2011-03-21 IMAGING — CR DG LUMBAR SPINE COMPLETE 4+V
5 series · 5 of 5 positions shown · non-contrast
Comparison: None.

CLINICAL DATA: Low back pain radiating to the legs.

LUMBAR SPINE - COMPLETE 4+ VIEW

[view not recorded (1 of 5)]
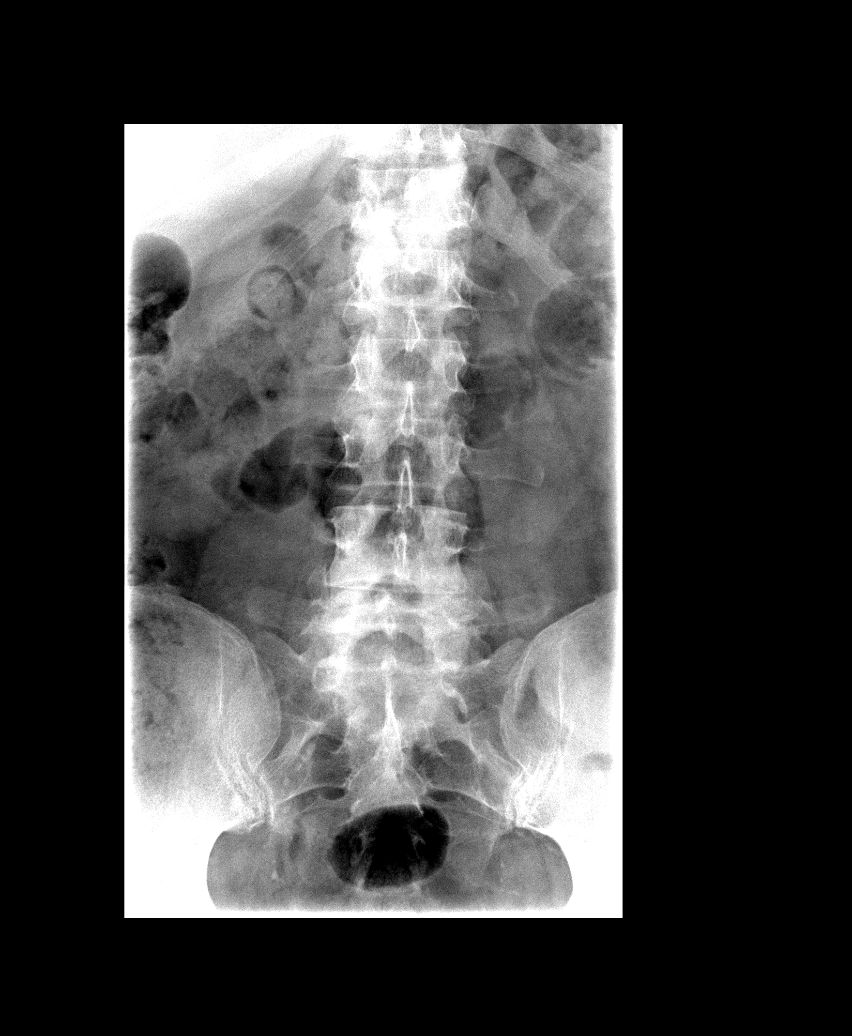

[view not recorded (2 of 5)]
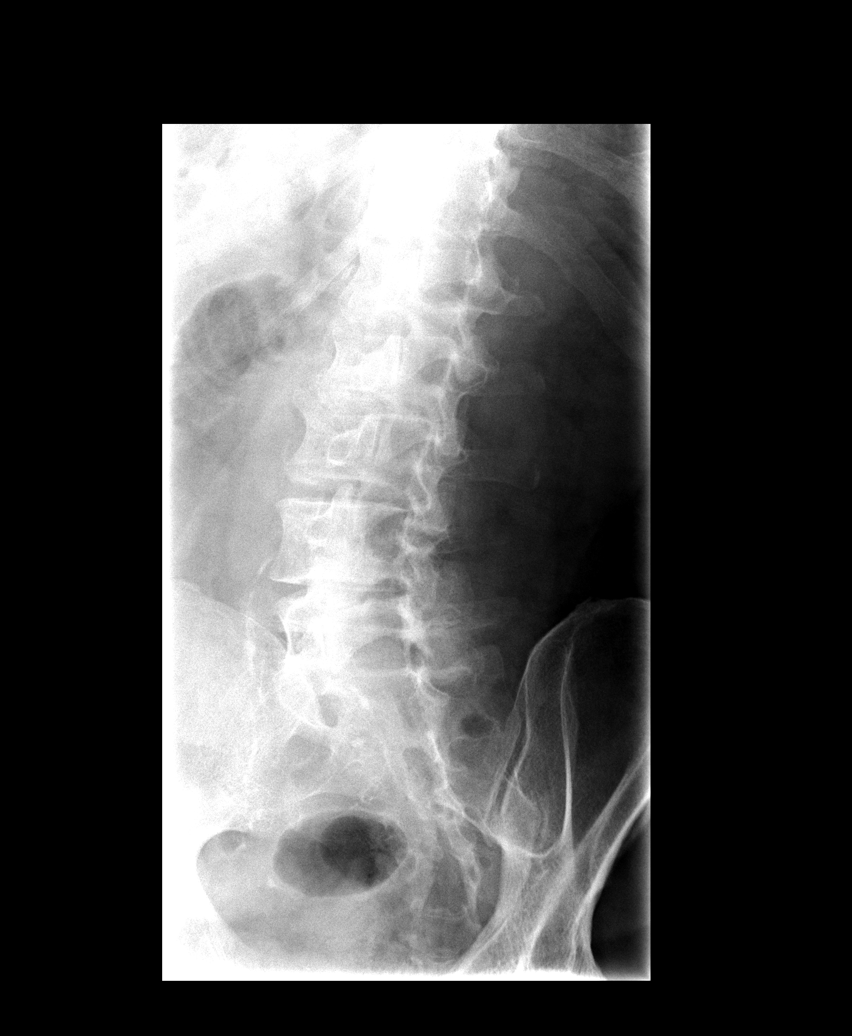

[view not recorded (3 of 5)]
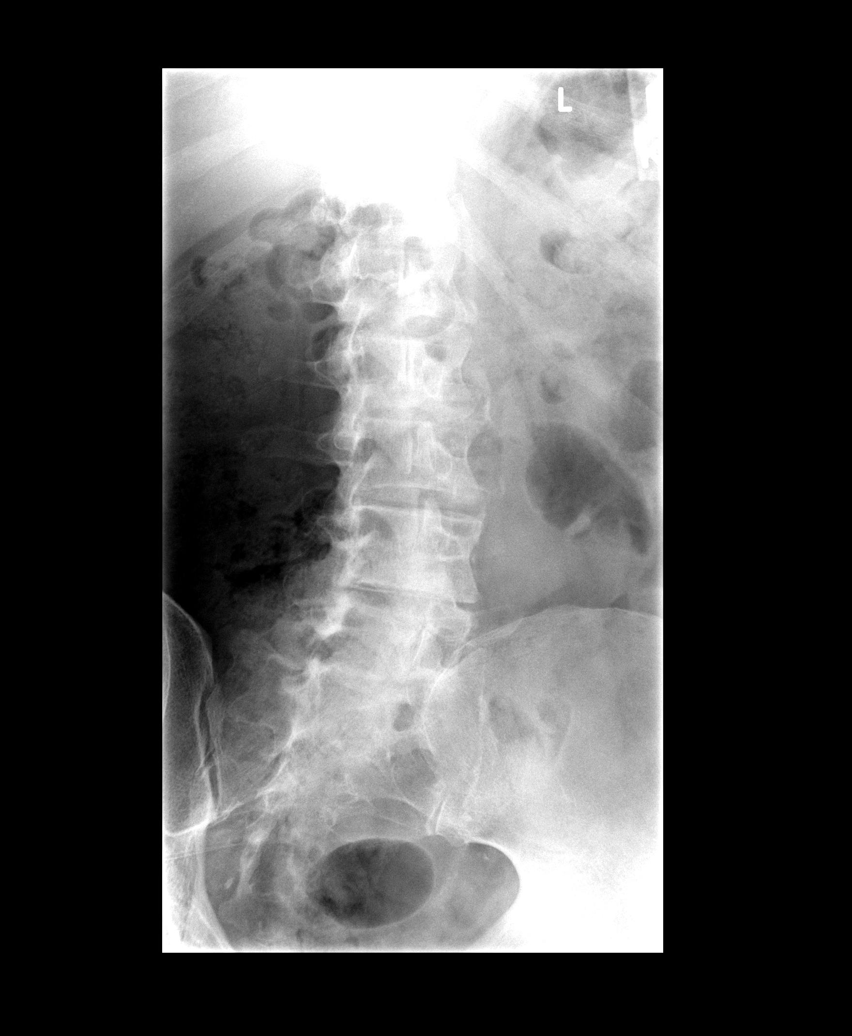

[view not recorded (4 of 5)]
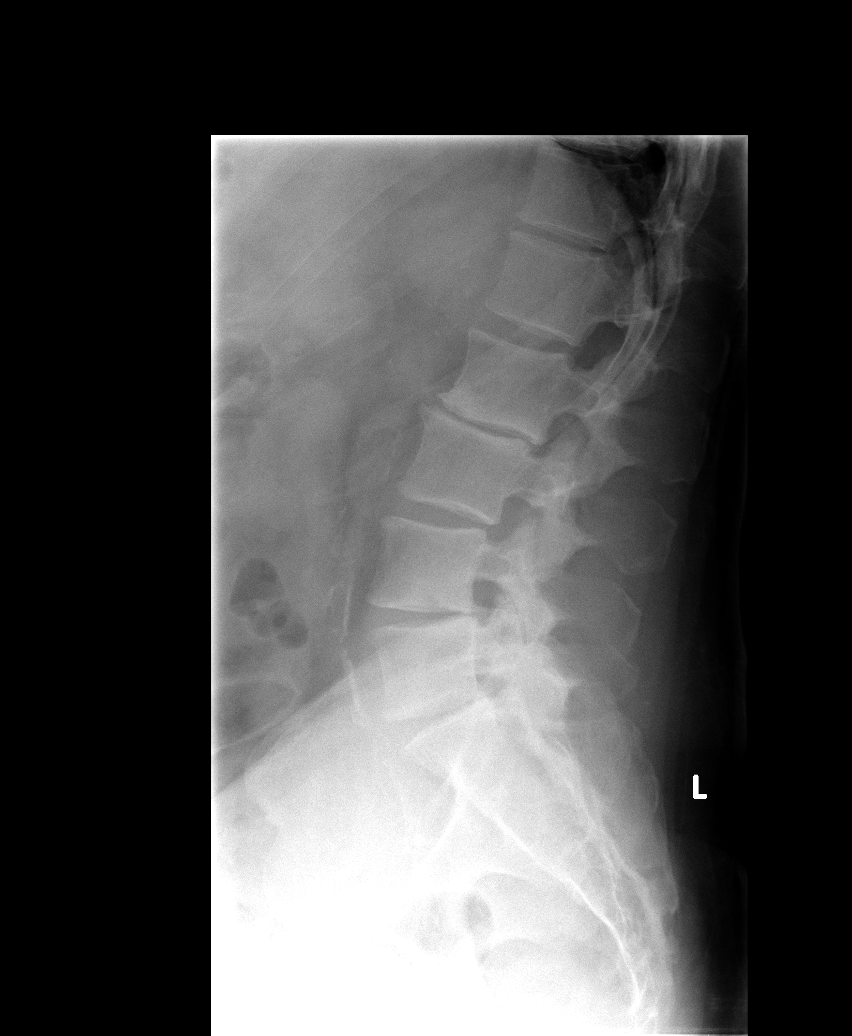

[view not recorded (5 of 5)]
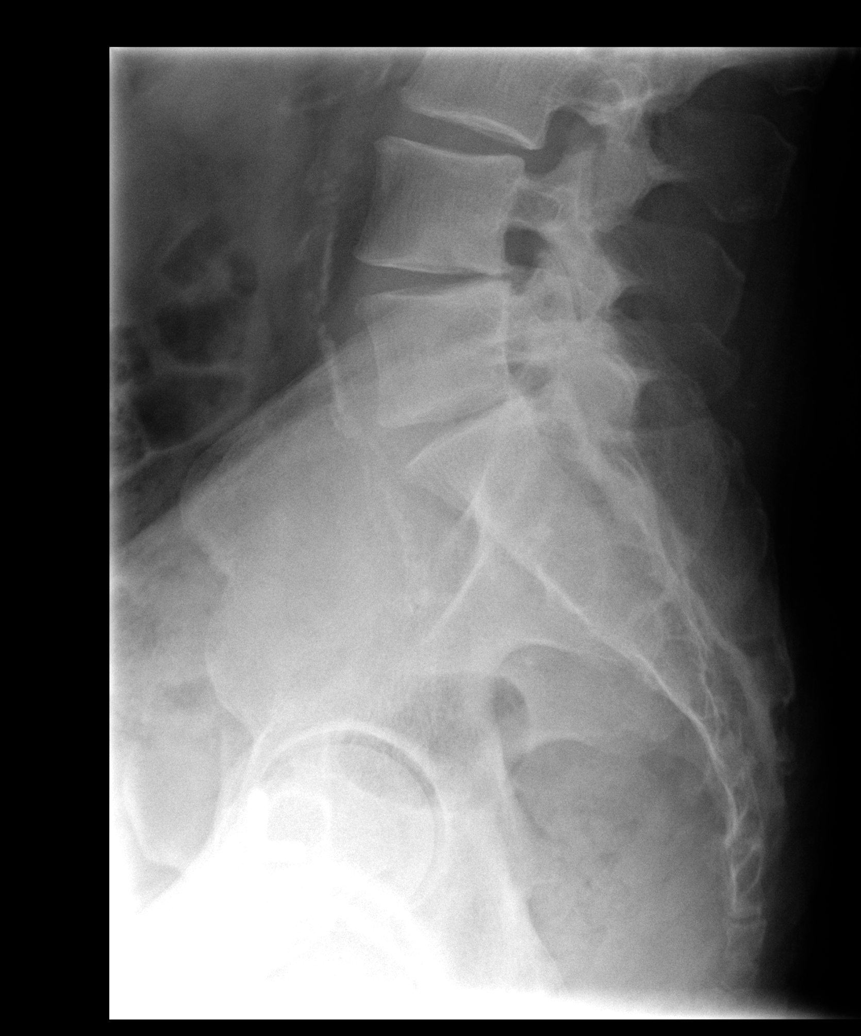

[5 of 5 positions shown; findings below may reference images not displayed]

FINDINGS: Vertebral body height and alignment are maintained.  The
patient has multilevel loss of disc space height appearing worst at
L2-3, L4-5 and L5-S1.  Facet degenerative change appears worst at
L4-5 and L5-S1.
IMPRESSION: Multilevel spondylosis.  No acute finding.

## 2011-03-27 ENCOUNTER — Other Ambulatory Visit: Payer: Self-pay | Admitting: Cardiovascular Disease

## 2011-04-16 ENCOUNTER — Other Ambulatory Visit: Payer: Self-pay

## 2011-04-16 MED ORDER — ATORVASTATIN CALCIUM 80 MG PO TABS: 80.0000 mg | ORAL_TABLET | Freq: Every day | ORAL | Status: DC

## 2011-06-03 ENCOUNTER — Telehealth: Payer: Self-pay | Admitting: Cardiovascular Disease

## 2011-06-03 NOTE — Telephone Encounter (Signed)
New problem Pt wants all meds pantoprazole Losartan plavix Metoprolol zetia Atorvastatin Please send 90 days supply to [facility] on [address]

## 2011-06-04 MED ORDER — LOSARTAN POTASSIUM 100 MG PO TABS: 100.0000 mg | ORAL_TABLET | ORAL | Status: DC

## 2011-06-04 MED ORDER — EZETIMIBE 10 MG PO TABS: 10.0000 mg | ORAL_TABLET | Freq: Every day | ORAL | Status: DC

## 2011-06-04 MED ORDER — ATORVASTATIN CALCIUM 80 MG PO TABS: 80.0000 mg | ORAL_TABLET | Freq: Every day | ORAL | Status: DC

## 2011-06-04 MED ORDER — CLOPIDOGREL BISULFATE 75 MG PO TABS: 75.0000 mg | ORAL_TABLET | Freq: Every day | ORAL | Status: DC

## 2011-06-04 MED ORDER — PANTOPRAZOLE SODIUM 40 MG PO TBEC: 40.0000 mg | DELAYED_RELEASE_TABLET | Freq: Every day | ORAL | Status: DC

## 2011-06-04 MED ORDER — METOPROLOL SUCCINATE ER 100 MG PO TB24: 50.0000 mg | ORAL_TABLET | Freq: Every day | ORAL | Status: DC

## 2011-06-10 ENCOUNTER — Other Ambulatory Visit: Payer: Self-pay | Admitting: Internal Medicine

## 2011-06-15 ENCOUNTER — Telehealth: Payer: Self-pay | Admitting: Internal Medicine

## 2011-06-15 NOTE — Telephone Encounter (Signed)
The pt has called the [facility] pharmacy and they are telling the pt they dont have the rx of Xanax .5mg .  I saw where the rx was sent.  Is there a way to get this re-sent or to see why [facility] does not have it?   Thanks so much!

## 2011-06-15 NOTE — Telephone Encounter (Signed)
Patient is requesting refill on his alprazolam.  Ok to refill.

## 2011-06-16 MED ORDER — ALPRAZOLAM 0.5 MG PO TABS: 0.5000 mg | ORAL_TABLET | Freq: Every evening | ORAL | Status: DC | PRN

## 2011-07-08 ENCOUNTER — Telehealth: Payer: Self-pay | Admitting: Cardiovascular Disease

## 2011-07-08 NOTE — Telephone Encounter (Signed)
New msg Pt wants to know if he should come in for blood work before appt on Tuesday. Please let him know

## 2011-07-08 NOTE — Telephone Encounter (Signed)
I reviewed the pt's 12/25/10 office note and the pt is not due to see Dr Excell Seltzer again until 12/2011.  I spoke with the pt's wife and made her aware of this information.  She will have the pt call back in regards to appointment.

## 2011-07-08 NOTE — Telephone Encounter (Signed)
Pt's appointment cancelled.

## 2011-07-13 ENCOUNTER — Ambulatory Visit: Payer: Medicare Other | Admitting: Cardiovascular Disease

## 2011-07-14 NOTE — Telephone Encounter (Signed)
Refill already done, working with Pam to clean up some charts in her in basket.

## 2011-08-04 DIAGNOSIS — E291 Testicular hypofunction: Secondary | ICD-10-CM | POA: Diagnosis not present

## 2011-08-11 DIAGNOSIS — N400 Enlarged prostate without lower urinary tract symptoms: Secondary | ICD-10-CM | POA: Diagnosis not present

## 2011-08-11 DIAGNOSIS — E291 Testicular hypofunction: Secondary | ICD-10-CM | POA: Diagnosis not present

## 2011-08-11 DIAGNOSIS — N529 Male erectile dysfunction, unspecified: Secondary | ICD-10-CM | POA: Diagnosis not present

## 2011-09-13 ENCOUNTER — Encounter: Payer: Self-pay | Admitting: Internal Medicine

## 2011-09-13 ENCOUNTER — Ambulatory Visit (INDEPENDENT_AMBULATORY_CARE_PROVIDER_SITE_OTHER): Payer: Medicare Other | Admitting: Internal Medicine

## 2011-09-13 VITALS — BP 108/62 | HR 77 | Temp 98.9°F | Resp 16 | Wt 182.5 lb

## 2011-09-13 DIAGNOSIS — J069 Acute upper respiratory infection, unspecified: Secondary | ICD-10-CM | POA: Diagnosis not present

## 2011-09-13 MED ORDER — PROMETHAZINE-CODEINE 6.25-10 MG/5ML PO SYRP: 5.0000 mL | ORAL_SOLUTION | ORAL | Status: AC | PRN

## 2011-09-13 NOTE — Patient Instructions (Signed)
Viral Upper respiratory infection - no indication for antibiotics. Throat without pus or significant redness, right eardrum is normal, no enlarged lymph nodes, lungs are clear.  Plan - take loratadine 10 one a day for the drainage.           Promethazine with codeine cough syrup 1 tsp every 4 hours           Mucinex 1200 mg twice a day           Hydrate, tylenol 1000 mg three times a day until you are feeling better.           Gargle of choice, e.g. Cepacol, etc            Vitamin C 1000-1500 mg daily           Call for fever, bitter tasting purulent mucus, shortness of breath.  Upper Respiratory Infection, Adult An upper respiratory infection (URI) is also sometimes known as the common cold. The upper respiratory tract includes the nose, sinuses, throat, trachea, and bronchi. Bronchi are the airways leading to the lungs. Most people improve within 1 week, but symptoms can last up to 2 weeks. A residual cough may last even longer.   CAUSES Many different viruses can infect the tissues lining the upper respiratory tract. The tissues become irritated and inflamed and often become very moist. Mucus production is also common. A cold is contagious. You can easily spread the virus to others by oral contact. This includes kissing, sharing a glass, coughing, or sneezing. Touching your mouth or nose and then touching a surface, which is then touched by another person, can also spread the virus. SYMPTOMS   Symptoms typically develop 1 to 3 days after you come in contact with a cold virus. Symptoms vary from person to person. They may include:  Runny nose.   Sneezing.   Nasal congestion.   Sinus irritation.   Sore throat.   Loss of voice (laryngitis).   Cough.   Fatigue.   Muscle aches.   Loss of appetite.   Headache.   Low-grade fever.  DIAGNOSIS   You might diagnose your own cold based on familiar symptoms, since most people get a cold 2 to 3 times a year. Your caregiver can confirm  this based on your exam. Most importantly, your caregiver can check that your symptoms are not due to another disease such as strep throat, sinusitis, pneumonia, asthma, or epiglottitis. Blood tests, throat tests, and X-rays are not necessary to diagnose a common cold, but they may sometimes be helpful in excluding other more serious diseases. Your caregiver will decide if any further tests are required. RISKS AND COMPLICATIONS   You may be at risk for a more severe case of the common cold if you smoke cigarettes, have chronic heart disease (such as heart failure) or lung disease (such as asthma), or if you have a weakened immune system. The very young and very old are also at risk for more serious infections. Bacterial sinusitis, middle ear infections, and bacterial pneumonia can complicate the common cold. The common cold can worsen asthma and chronic obstructive pulmonary disease (COPD). Sometimes, these complications can require emergency medical care and may be life-threatening. PREVENTION   The best way to protect against getting a cold is to practice good hygiene. Avoid oral or hand contact with people with cold symptoms. Wash your hands often if contact occurs. There is no clear evidence that vitamin C, vitamin E, echinacea, or exercise reduces the  chance of developing a cold. However, it is always recommended to get plenty of rest and practice good nutrition. TREATMENT   Treatment is directed at relieving symptoms. There is no cure. Antibiotics are not effective, because the infection is caused by a virus, not by bacteria. Treatment may include:  Increased fluid intake. Sports drinks offer valuable electrolytes, sugars, and fluids.   Breathing heated mist or steam (vaporizer or shower).   Eating chicken soup or other clear broths, and maintaining good nutrition.   Getting plenty of rest.   Using gargles or lozenges for comfort.   Controlling fevers with ibuprofen or acetaminophen as  directed by your caregiver.   Increasing usage of your inhaler if you have asthma.  Zinc gel and zinc lozenges, taken in the first 24 hours of the common cold, can shorten the duration and lessen the severity of symptoms. Pain medicines may help with fever, muscle aches, and throat pain. A variety of non-prescription medicines are available to treat congestion and runny nose. Your caregiver can make recommendations and may suggest nasal or lung inhalers for other symptoms.   HOME CARE INSTRUCTIONS    Only take over-the-counter or prescription medicines for pain, discomfort, or fever as directed by your caregiver.   Use a warm mist humidifier or inhale steam from a shower to increase air moisture. This may keep secretions moist and make it easier to breathe.   Drink enough water and fluids to keep your urine clear or pale yellow.   Rest as needed.   Return to work when your temperature has returned to normal or as your caregiver advises. You may need to stay home longer to avoid infecting others. You can also use a face mask and careful hand washing to prevent spread of the virus.  SEEK MEDICAL CARE IF:    After the first few days, you feel you are getting worse rather than better.   You need your caregiver's advice about medicines to control symptoms.   You develop chills, worsening shortness of breath, or brown or red sputum. These may be signs of pneumonia.   You develop yellow or brown nasal discharge or pain in the face, especially when you bend forward. These may be signs of sinusitis.   You develop a fever, swollen neck glands, pain with swallowing, or white areas in the back of your throat. These may be signs of strep throat.  SEEK IMMEDIATE MEDICAL CARE IF:    You have a fever.   You develop severe or persistent headache, ear pain, sinus pain, or chest pain.   You develop wheezing, a prolonged cough, cough up blood, or have a change in your usual mucus (if you have chronic  lung disease).   You develop sore muscles or a stiff neck.  Document Released: 12/15/2000 Document Revised: 06/10/2011 Document Reviewed: 10/23/2010 [facility] Patient Information 2012 [place], [place].

## 2011-09-14 NOTE — Progress Notes (Signed)
SUBJECTIVE:  Joshua Brown is a 66 y.o. male who complains of coryza, congestion, sneezing, sore throat, post nasal drip, productive cough and hoarseness for 5 days. He denies a history of anorexia, chills, fevers, myalgias, shortness of breath and sweats and denies a history of asthma. Patient does not smoke cigarettes.   OBJECTIVE: He appears well, vital signs are as noted. Ears normal.  Throat and pharynx normal.  Neck supple. No adenopathy in the neck. Nose is congested. Sinuses non tender. The chest is clear, without wheezes or rales.  ASSESSMENT:  viral upper respiratory illness  PLAN: Symptomatic therapy suggested: push fluids, rest and return office visit prn if symptoms persist or worsen. Lack of antibiotic effectiveness discussed with him. Call or return to clinic prn if these symptoms worsen or fail to improve as anticipated.

## 2011-09-15 ENCOUNTER — Other Ambulatory Visit: Payer: Self-pay

## 2011-09-15 DIAGNOSIS — I739 Peripheral vascular disease, unspecified: Secondary | ICD-10-CM

## 2011-09-15 DIAGNOSIS — I25810 Atherosclerosis of coronary artery bypass graft(s) without angina pectoris: Secondary | ICD-10-CM

## 2011-09-15 DIAGNOSIS — I70219 Atherosclerosis of native arteries of extremities with intermittent claudication, unspecified extremity: Secondary | ICD-10-CM

## 2011-09-15 DIAGNOSIS — E785 Hyperlipidemia, unspecified: Secondary | ICD-10-CM

## 2011-10-26 DIAGNOSIS — H40059 Ocular hypertension, unspecified eye: Secondary | ICD-10-CM | POA: Diagnosis not present

## 2011-11-11 ENCOUNTER — Ambulatory Visit (INDEPENDENT_AMBULATORY_CARE_PROVIDER_SITE_OTHER): Payer: Medicare Other | Admitting: Internal Medicine

## 2011-11-11 ENCOUNTER — Encounter: Payer: Self-pay | Admitting: Internal Medicine

## 2011-11-11 VITALS — BP 104/70 | HR 65 | Temp 98.3°F | Resp 16 | Wt 186.0 lb

## 2011-11-11 DIAGNOSIS — Z23 Encounter for immunization: Secondary | ICD-10-CM | POA: Diagnosis not present

## 2011-11-11 DIAGNOSIS — E663 Overweight: Secondary | ICD-10-CM

## 2011-11-11 DIAGNOSIS — I2510 Atherosclerotic heart disease of native coronary artery without angina pectoris: Secondary | ICD-10-CM | POA: Diagnosis not present

## 2011-11-11 DIAGNOSIS — I739 Peripheral vascular disease, unspecified: Secondary | ICD-10-CM | POA: Diagnosis not present

## 2011-11-11 DIAGNOSIS — F528 Other sexual dysfunction not due to a substance or known physiological condition: Secondary | ICD-10-CM | POA: Diagnosis not present

## 2011-11-11 DIAGNOSIS — E785 Hyperlipidemia, unspecified: Secondary | ICD-10-CM

## 2011-11-11 DIAGNOSIS — K219 Gastro-esophageal reflux disease without esophagitis: Secondary | ICD-10-CM

## 2011-11-11 DIAGNOSIS — R351 Nocturia: Secondary | ICD-10-CM

## 2011-11-11 DIAGNOSIS — Z0000 Encounter for general adult medical examination without abnormal findings: Secondary | ICD-10-CM

## 2011-11-11 DIAGNOSIS — IMO0002 Reserved for concepts with insufficient information to code with codable children: Secondary | ICD-10-CM

## 2011-11-11 DIAGNOSIS — I10 Essential (primary) hypertension: Secondary | ICD-10-CM

## 2011-11-11 NOTE — Progress Notes (Signed)
Subjective:    Patient ID: Joshua Brown, male    DOB: Nov 02, 1945, 66 y.o.   MRN: [id number]  HPI The patient is here for annual Medicare wellness examination and management of other chronic and acute problems. He has had a good year: one bad cold; has had some peridontal work done.    The risk factors are reflected in the social history.  The roster of all physicians providing medical care to patient - is listed in the Snapshot section of the chart.  Activities of daily living:  The patient is 100% inedpendent in all ADLs: dressing, toileting, feeding as well as independent mobility  Home safety : The patient has smoke detectors in the home. Fall - has done a fall survey but needs to implement.  They wear seatbelts. No firearms at home  There is no risks for hepatitis, STDs or HIV. There is no history of blood transfusion. They have no travel history to infectious disease endemic areas of the world.  The patient has seen their dentist in the last six month. They have seen their eye doctor in the last year. They deny any hearing difficulty and have not had audiologic testing in the last year.  They do not  have excessive sun exposure. Discussed the need for sun protection: hats, long sleeves and use of sunscreen if there is significant sun exposure.   Diet: the importance of a healthy diet is discussed. They do have a healthy diet.  The patient has no regular exercise program.  The benefits of regular aerobic exercise were discussed.  Depression screen: there are no signs or vegative symptoms of depression- irritability, change in appetite, anhedonia, sadness/tearfullness.  Cognitive assessment: the patient manages all their financial and personal affairs and is actively engaged.   The following portions of the patient's history were reviewed and updated as appropriate: allergies, current medications, past family history, past medical history,  past surgical history, past social history   and problem list.  Vision, hearing, body mass index were assessed and reviewed.   During the course of the visit the patient was educated and counseled about appropriate screening and preventive services including : fall prevention , diabetes screening, nutrition counseling, colorectal cancer screening, and recommended immunizations.  Past Medical History  Diagnosis Date  . Myocardial infarct, old   . Hypertension   . ED (erectile dysfunction)   . Hyperglycemia     Borderline  . Obesity     Class I  . PVD (peripheral vascular disease)     ABI June '12 -0.9 bilateral.  . Frequent urination at night     nocturia 2-3  . GERD (gastroesophageal reflux disease)   . Hyperlipidemia     Atherogenic  . CAD (coronary artery disease)     premature  . DDD (degenerative disc disease)     lumbar spine   Past Surgical History  Procedure Date  . Coronary artery bypass graft   . Tonsillectomy     age 45   Family History  Problem Relation Age of Onset  . Cancer Mother     Jaw Cancer  . Hyperlipidemia Mother   . Heart disease Mother     CHF  . Early death Father   . Heart disease Father     CAD/MI  . Cancer Brother     prostate  . Heart disease Paternal Grandfather     CAD/MI   History   Social History  . Marital Status: Married  Spouse Name: N/A    Number of Children: 3  . Years of Education: 17   Occupational History  . [employer]     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   Social History Main Topics  . Smoking status: Former Smoker -- 1.0 packs/day for 16 years    Types: Cigarettes    Quit date: 07/05/1969  . Smokeless tobacco: Never Used  . Alcohol Use: 1.0 oz/week    2 drink(s) per week  . Drug Use: No  . Sexually Active: Yes -- Male partner(s)   Other Topics Concern  . Not on file   Social History Narrative   HSG, [employer] 1 year. Married 1977. 1 son - 56; 2 daughters - 39, 1; 3  Grandchildren. Work - self - employed Fish farm manager. Marriage in good  health. ACP - discussed. Provided packet May '13.       Review of Systems Constitutional:  Negative for fever, chills, activity change and unexpected weight change.  HEENT:  Negative for hearing loss, ear pain, congestion, neck stiffness and postnasal drip. Negative for sore throat or swallowing problems. Negative for dental complaints.   Eyes: Negative for vision loss or change in visual acuity.  Respiratory: Negative for chest tightness and wheezing. Negative for DOE.   Cardiovascular: Negative for chest pain or palpitations. No decreased exercise tolerance Gastrointestinal: No change in bowel habit. No bloating or gas. No reflux or indigestion Genitourinary: Negative for urgency, frequency, flank pain and difficulty urinating.  Musculoskeletal: Negative for myalgias, back pain, arthralgias and gait problem.  Neurological: Negative for dizziness, tremors, weakness and headaches.  Hematological: Negative for adenopathy.  Psychiatric/Behavioral: Negative for behavioral problems and dysphoric mood.       Objective:   Physical Exam Filed Vitals:   11/11/11 1012  BP: 104/70  Pulse: 65  Temp: 98.3 F (36.8 C)  Resp: 16   Wt Readings from Last 3 Encounters:  11/11/11 186 lb (84.369 kg)  09/13/11 182 lb 8 oz (82.781 kg)  12/25/10 181 lb 6.4 oz (82.283 kg)    Gen'l: Well nourished well developed white male in no acute distress  HEENT: Head: Normocephalic and atraumatic. Right Ear: External ear normal. EAC/TM nl. Left Ear: External ear normal.  EAC/TM nl. Nose: Nose normal. Mouth/Throat: Oropharynx is clear and moist. Dentition - native, in good repair. No buccal or palatal lesions. Posterior pharynx clear. Eyes: Conjunctivae and sclera clear. EOM intact. Pupils are equal, round, and reactive to light. Right eye exhibits no discharge. Left eye exhibits no discharge. Neck: Normal range of motion. Neck supple. No JVD present. No tracheal deviation present. No thyromegaly present.    Cardiovascular: Normal rate, regular rhythm, no gallop, no friction rub, no murmur heard.      Quiet precordium. 2+ radial and trace to 1+  DP pulses . No carotid bruits Pulmonary/Chest: Effort normal. No respiratory distress or increased WOB, no wheezes, no rales. No chest wall deformity or CVAT. Abdominal: Soft. Bowel sounds are normal in all quadrants. He exhibits no distension, no tenderness, no rebound or guarding, No heptosplenomegaly  Genitourinary:  deferred Musculoskeletal: Normal range of motion. He exhibits no edema and no tenderness.       Small and large joints without redness, synovial thickening or deformity. Full range of motion preserved about all small, median and large joints.  Lymphadenopathy:    He has no cervical or supraclavicular adenopathy.  Neurological: He is alert and oriented to person, place, and time. CN II-XII intact.  DTRs 2+ and symmetrical biceps, radial and patellar tendons. Cerebellar function normal with no tremor, rigidity, normal gait and station.  Skin: Skin is warm and dry. No rash noted. No erythema.  Psychiatric: He has a normal mood and affect. His behavior is normal. Thought content normal.    Labs - on order by Dr. Excell Seltzer - patient plans to get these done in the near future      Assessment & Plan:

## 2011-11-13 DIAGNOSIS — Z0000 Encounter for general adult medical examination without abnormal findings: Secondary | ICD-10-CM | POA: Insufficient documentation

## 2011-11-13 NOTE — Assessment & Plan Note (Signed)
Stable and chest pain free; no exertional chest pain. Followed closely by Drs. Myrtis Ser and [facility] - continue current regimen of risk reduction.

## 2011-11-13 NOTE — Assessment & Plan Note (Signed)
Lab Results  Component Value Date   CHOL 149 06/17/2010   HDL 33.10* 06/17/2010   LDLCALC 101* 06/17/2010   TRIG 76.0 06/17/2010   CHOLHDL 5 06/17/2010   Previously suboptimal control with goal of LDL less than 80 given his burden of vascular disease.   Plan - continue high dose lipitor + Zetia + niacin  Repeat lipid profile on order - Dr. Excell Seltzer to follow.

## 2011-11-13 NOTE — Assessment & Plan Note (Addendum)
Wt Readings from Last 3 Encounters:  11/11/11 186 lb (84.369 kg)  09/13/11 182 lb 8 oz (82.781 kg)  12/25/10 181 lb 6.4 oz (82.283 kg)  BMI 29  Plan Weight management: smart food choices, PORTION SIZE CONTROL, regular exercise.  Target weight 170 labs; Goal - to loose 1-2 lbs / month

## 2011-11-13 NOTE — Assessment & Plan Note (Signed)
BP Readings from Last 3 Encounters:  11/11/11 104/70  09/13/11 108/62  12/25/10 118/68   Good control on present regimen.

## 2011-11-13 NOTE — Assessment & Plan Note (Signed)
Patient did not raise this as an issue that was affecting his quality of life. Will not add additional medication at this point. However, should he have symptomatic nocturia would consider fenesteride due to low side affect profile.

## 2011-11-13 NOTE — Assessment & Plan Note (Signed)
Interval history  - he has had no major events, surgery, injury. He continues to have close follow-up by cardiology and is due for follow-up testing. Physical exam,sans prostate, is normal. Labs are up-coming. In regard to colorectal cancer screening need latest colonoscopy report from Dr. Kinnie Scales. Immunization - pneumonia vaccine today, due tetanus.  In summary - a very nice man who has a large burden of vascular disease who is medically stable. He will have vascular studies in the near future with recommendations from Dr. Excell Seltzer to follow. He is asked to return to Primary care in 6 months for mid-year general check up.

## 2011-11-13 NOTE — Assessment & Plan Note (Signed)
Doing well with no complaint of severe or activity limiting back pain.  Plan - needs to do regular back exercise.

## 2011-11-13 NOTE — Assessment & Plan Note (Signed)
Stable on PPI therapy. Discussed possibity of transitioning to H2 blocker therapy.  Plan - at this time with good control of symptoms will not change his regimen

## 2011-11-13 NOTE — Assessment & Plan Note (Signed)
Discussed ED and that in his case it is almost certainly related to his athersclerosis and decreased vascular supply that limits tumescence.

## 2011-11-13 NOTE — Assessment & Plan Note (Signed)
Patient is not complaining of activity limiting claudication. He is scheduled by Dr. Excell Seltzer for follow-up study.  Plan - continue risk reduction regimen.

## 2011-12-01 ENCOUNTER — Encounter: Payer: Medicare Other | Admitting: Internal Medicine

## 2011-12-06 DIAGNOSIS — E291 Testicular hypofunction: Secondary | ICD-10-CM | POA: Diagnosis not present

## 2011-12-22 DIAGNOSIS — N529 Male erectile dysfunction, unspecified: Secondary | ICD-10-CM | POA: Diagnosis not present

## 2011-12-22 DIAGNOSIS — N400 Enlarged prostate without lower urinary tract symptoms: Secondary | ICD-10-CM | POA: Diagnosis not present

## 2011-12-22 DIAGNOSIS — E291 Testicular hypofunction: Secondary | ICD-10-CM | POA: Diagnosis not present

## 2011-12-22 DIAGNOSIS — N401 Enlarged prostate with lower urinary tract symptoms: Secondary | ICD-10-CM | POA: Diagnosis not present

## 2011-12-23 ENCOUNTER — Encounter (INDEPENDENT_AMBULATORY_CARE_PROVIDER_SITE_OTHER): Payer: Medicare Other

## 2011-12-23 ENCOUNTER — Other Ambulatory Visit (INDEPENDENT_AMBULATORY_CARE_PROVIDER_SITE_OTHER): Payer: Medicare Other

## 2011-12-23 DIAGNOSIS — I70219 Atherosclerosis of native arteries of extremities with intermittent claudication, unspecified extremity: Secondary | ICD-10-CM

## 2011-12-23 DIAGNOSIS — I25810 Atherosclerosis of coronary artery bypass graft(s) without angina pectoris: Secondary | ICD-10-CM

## 2011-12-23 DIAGNOSIS — I739 Peripheral vascular disease, unspecified: Secondary | ICD-10-CM | POA: Diagnosis not present

## 2011-12-23 DIAGNOSIS — E785 Hyperlipidemia, unspecified: Secondary | ICD-10-CM

## 2011-12-23 DIAGNOSIS — I700 Atherosclerosis of aorta: Secondary | ICD-10-CM | POA: Diagnosis not present

## 2011-12-23 LAB — HEPATIC FUNCTION PANEL
AST: 30 U/L (ref 0–37)
Albumin: 4 g/dL (ref 3.5–5.2)

## 2011-12-23 LAB — LIPID PANEL
HDL: 43.5 mg/dL (ref >39.00)
Total CHOL/HDL Ratio: 3
Triglycerides: 63 mg/dL (ref 0.0–149.0)

## 2011-12-23 LAB — BASIC METABOLIC PANEL
CO2: 30 mEq/L (ref 19–32)
Calcium: 9.1 mg/dL (ref 8.4–10.5)
GFR: 68.06 mL/min (ref >60.00)
Sodium: 141 mEq/L (ref 135–145)

## 2011-12-27 ENCOUNTER — Encounter (INDEPENDENT_AMBULATORY_CARE_PROVIDER_SITE_OTHER): Payer: Medicare Other

## 2011-12-27 DIAGNOSIS — I25810 Atherosclerosis of coronary artery bypass graft(s) without angina pectoris: Secondary | ICD-10-CM

## 2011-12-27 DIAGNOSIS — I739 Peripheral vascular disease, unspecified: Secondary | ICD-10-CM | POA: Diagnosis not present

## 2011-12-27 DIAGNOSIS — E785 Hyperlipidemia, unspecified: Secondary | ICD-10-CM

## 2011-12-27 DIAGNOSIS — I70219 Atherosclerosis of native arteries of extremities with intermittent claudication, unspecified extremity: Secondary | ICD-10-CM

## 2011-12-28 ENCOUNTER — Ambulatory Visit (INDEPENDENT_AMBULATORY_CARE_PROVIDER_SITE_OTHER): Payer: Medicare Other | Admitting: Cardiovascular Disease

## 2011-12-28 ENCOUNTER — Encounter: Payer: Self-pay | Admitting: Cardiovascular Disease

## 2011-12-28 ENCOUNTER — Encounter (INDEPENDENT_AMBULATORY_CARE_PROVIDER_SITE_OTHER): Payer: Medicare Other

## 2011-12-28 VITALS — BP 108/72 | HR 67 | Ht 66.75 in | Wt 184.0 lb

## 2011-12-28 DIAGNOSIS — I6529 Occlusion and stenosis of unspecified carotid artery: Secondary | ICD-10-CM

## 2011-12-28 DIAGNOSIS — I2510 Atherosclerotic heart disease of native coronary artery without angina pectoris: Secondary | ICD-10-CM

## 2011-12-28 DIAGNOSIS — I25810 Atherosclerosis of coronary artery bypass graft(s) without angina pectoris: Secondary | ICD-10-CM

## 2011-12-28 DIAGNOSIS — I739 Peripheral vascular disease, unspecified: Secondary | ICD-10-CM

## 2011-12-28 DIAGNOSIS — E785 Hyperlipidemia, unspecified: Secondary | ICD-10-CM | POA: Diagnosis not present

## 2011-12-28 DIAGNOSIS — I70219 Atherosclerosis of native arteries of extremities with intermittent claudication, unspecified extremity: Secondary | ICD-10-CM

## 2011-12-28 NOTE — Patient Instructions (Addendum)
Your physician wants you to follow-up in: 1 YEAR. You will receive a reminder letter in the mail two months in advance. If you don't receive a letter, please call our office to schedule the follow-up appointment.  Your physician has requested that you have an exercise stress myoview. For further information please visit [email or web address] Please follow instruction sheet, as given.  Your physician recommends that you continue on your current medications as directed. Please refer to the Current Medication list given to you today.

## 2012-01-10 ENCOUNTER — Encounter: Payer: Self-pay | Admitting: Cardiovascular Disease

## 2012-01-10 NOTE — Progress Notes (Signed)
HPI:  66 year-old male presents for follow-up evaluation. He is followed for CAD s/p remote MI and CABG, PAD with distal abdominal aortic stenosis, and hyperlipidemia. He has had episodic chest pains, nonexertional, that feel like a 'pressure' over the left chest, nonradiating. He hasn't been engaged in vigorous exercise. 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 He reports stable symptoms of bilateral calf discomfort with walking. This does not particularly limit his activity.  Outpatient Encounter Prescriptions as of 12/28/2011  Medication Sig Dispense Refill  . ALPRAZolam (XANAX) 0.5 MG tablet Take 1 tablet (0.5 mg total) by mouth at bedtime as needed for sleep.  90 tablet  3  . aspirin 81 MG EC tablet Take 81 mg by mouth daily.        Marland Kitchen atorvastatin (LIPITOR) 80 MG tablet Take 1 tablet (80 mg total) by mouth daily.  90 tablet  2  . b complex vitamins capsule Take 1 capsule by mouth daily.        . Cholecalciferol (VITAMIN D) 2000 UNIT CAPS Take 2,000 capsules by mouth daily.        . clopidogrel (PLAVIX) 75 MG tablet Take 1 tablet (75 mg total) by mouth daily.  90 tablet  2  . ezetimibe (ZETIA) 10 MG tablet Take 1 tablet (10 mg total) by mouth daily.  90 tablet  2  . metoprolol (TOPROL-XL) 100 MG 24 hr tablet Take 0.5 tablets (50 mg total) by mouth daily.  45 tablet  2  . niacin (NIASPAN) 500 MG CR tablet Take 2,000 mg by mouth at bedtime.        . pantoprazole (PROTONIX) 40 MG tablet Take 1 tablet (40 mg total) by mouth daily.  90 tablet  2  . tadalafil (CIALIS) 10 MG tablet Take 10 mg by mouth daily as needed.        . Testosterone (AXIRON) 30 MG/ACT SOLN Place 2 application onto the skin daily.        Marland Kitchen DISCONTD: losartan (COZAAR) 100 MG tablet Take 1 tablet (100 mg total) by mouth as directed.  90 tablet  2    No Known Allergies  Past Medical History  Diagnosis Date  . Myocardial infarct, old   . Hypertension   . ED  (erectile dysfunction)   . Hyperglycemia     Borderline  . Obesity     Class I  . PVD (peripheral vascular disease)     ABI June '12 -0.9 bilateral.  . Frequent urination at night     nocturia 2-3  . GERD (gastroesophageal reflux disease)   . Hyperlipidemia     Atherogenic  . CAD (coronary artery disease)     premature  . DDD (degenerative disc disease)     lumbar spine    ROS: Negative except as per HPI  BP 108/72  Pulse 67  Ht 5' 6.75" (1.695 m)  Wt 83.462 kg (184 lb)  BMI 29.03 kg/m2  PHYSICAL EXAM: Pt is alert and oriented, NAD HEENT: normal Neck: JVP - normal, carotids 2+= without bruits Lungs: CTA bilaterally CV: RRR without murmur or gallop Abd: soft, NT, Positive BS, no hepatomegaly Ext: no C/C/E, distal pulses intact and equal Skin: warm/dry no rash  EKG:  NSR with occasional PVC's. ST-T abnormality consider inferolateral ischemia (new changes compared to last year's tracing)  ASSESSMENT AND PLAN: 1. Chest pain (typical and atypical features). In this patient with known  CAD and an abnormal EKG, I have recommended a nuclear stress test to evaluate for significant ischemia. I would have a low threshold to pursue cardiac catheterization if the nuclear study is abnormal. He will continue on his current medical program (ASA, plavix, beta-blocker, statin, niacin). He understands to contact us if progressive symptoms or to call EMS if resting symptoms unresponsive to NTG.  2. Hyperlipidemia - lipids are well treated with HDL over 40 and LDL less than 70. LFT's normal. Continue same Rx.  3. PAD - mild intermittent claudication. Continue serial follow-up, yearly imaging studies are done with aorto-iliac duplex studies. Most recent study reviewed - ABI's greater than 90% bilaterally.  He also underwent a carotid duplex showing no significant disease. He doesn't require further carotid imaging.  Tonny Bollman 01/10/2012 11:34 AM

## 2012-01-14 ENCOUNTER — Telehealth: Payer: Self-pay | Admitting: Cardiovascular Disease

## 2012-01-14 NOTE — Telephone Encounter (Signed)
Patient is scheduled for a stress test on Monday 01/17/12. He would like to know if he can take Xanax  0.5 mg tablet. He takes this medication once daily.  Dr. Excell Seltzer okay for pt to take Xanax medication prior the stress test. Patient and wife verbalized understanding.

## 2012-01-14 NOTE — Telephone Encounter (Signed)
Pt is having a stress test Monday and he has questions about his medication

## 2012-01-17 ENCOUNTER — Ambulatory Visit (HOSPITAL_COMMUNITY): Payer: Medicare Other | Attending: Cardiovascular Disease | Admitting: Radiology

## 2012-01-17 VITALS — BP 116/65 | HR 75 | Ht 66.0 in | Wt 175.0 lb

## 2012-01-17 DIAGNOSIS — I10 Essential (primary) hypertension: Secondary | ICD-10-CM | POA: Insufficient documentation

## 2012-01-17 DIAGNOSIS — R002 Palpitations: Secondary | ICD-10-CM | POA: Diagnosis not present

## 2012-01-17 DIAGNOSIS — R0989 Other specified symptoms and signs involving the circulatory and respiratory systems: Secondary | ICD-10-CM | POA: Insufficient documentation

## 2012-01-17 DIAGNOSIS — R0609 Other forms of dyspnea: Secondary | ICD-10-CM | POA: Diagnosis not present

## 2012-01-17 DIAGNOSIS — Z951 Presence of aortocoronary bypass graft: Secondary | ICD-10-CM | POA: Insufficient documentation

## 2012-01-17 DIAGNOSIS — Z8249 Family history of ischemic heart disease and other diseases of the circulatory system: Secondary | ICD-10-CM | POA: Insufficient documentation

## 2012-01-17 DIAGNOSIS — E785 Hyperlipidemia, unspecified: Secondary | ICD-10-CM | POA: Insufficient documentation

## 2012-01-17 DIAGNOSIS — I2510 Atherosclerotic heart disease of native coronary artery without angina pectoris: Secondary | ICD-10-CM

## 2012-01-17 DIAGNOSIS — Z87891 Personal history of nicotine dependence: Secondary | ICD-10-CM | POA: Diagnosis not present

## 2012-01-17 DIAGNOSIS — R079 Chest pain, unspecified: Secondary | ICD-10-CM | POA: Insufficient documentation

## 2012-01-17 DIAGNOSIS — I739 Peripheral vascular disease, unspecified: Secondary | ICD-10-CM | POA: Diagnosis not present

## 2012-01-17 DIAGNOSIS — R9431 Abnormal electrocardiogram [ECG] [EKG]: Secondary | ICD-10-CM | POA: Diagnosis not present

## 2012-01-17 MED ORDER — TECHNETIUM TC 99M TETROFOSMIN IV KIT
11.0000 | PACK | Freq: Once | INTRAVENOUS | Status: AC | PRN
  Administered 2012-01-17: 11 via INTRAVENOUS

## 2012-01-17 MED ORDER — TECHNETIUM TC 99M TETROFOSMIN IV KIT
33.0000 | PACK | Freq: Once | INTRAVENOUS | Status: AC | PRN
  Administered 2012-01-17: 33 via INTRAVENOUS

## 2012-01-17 NOTE — Progress Notes (Signed)
[facility] SITE 3 NUCLEAR MED [address] [place] [address] [phone]  Cardiology Nuclear Med Study  Joshua Brown is a 66 y.o. male     MRN : [id number]     DOB: 1945/10/13  Procedure Date: 01/17/2012  Nuclear Med Background Indication for Stress Test:  Evaluation for Ischemia, Graft Patency and Abnormal EKG History:  MI @ age 62; '87 CABG; '09 Cath:CAD, medical tx; '11 FIE:PPIRJJOA at base of inferior wall, EF=66%, medical tx. Cardiac Risk Factors: Claudication:bilateral, Family History - CAD, History of Smoking, Hypertension, Lipids and PVD:6/13 distal moderate abdominal aortic stenosis and stable mild bilateral common iliac artery stenosis Symptoms:  Chest Pain with and without Exertion (last episode of chest discomfort was about one week ago), DOE and Palpitations   Nuclear Pre-Procedure Caffeine/Decaff Intake:  None NPO After: 7:30pm   Lungs:  Clear. IV 0.9% NS with Angio Cath:  20g  IV Site: L Antecubital  IV Started by:  Irean Hong, RN  Chest Size (in):  42 Cup Size: n/a  Height: 5\' 6"  (1.676 m)  Weight:  175 lb (79.379 kg)  BMI:  Body mass index is 28.25 kg/(m^2). Tech Comments:  Toprol held x 36 hours    Nuclear Med Study 1 or 2 day study: 1 day  Stress Test Type:  Stress  Reading MD: Marca Ancona, MD  Order Authorizing Provider:  Casimiro Needle Cooper,MD  Resting Radionuclide: Technetium 4m Tetrofosmin  Resting Radionuclide Dose: 10.8 mCi   Stress Radionuclide:  Technetium 54m Tetrofosmin  Stress Radionuclide Dose: 31.5 mCi           Stress Protocol Rest HR: 75 Stress HR: 134  Rest BP: 116/65 Stress BP: 196/79  Exercise Time (min): 10:02 METS: 11.7   Predicted Max HR: 153 bpm % Max HR: 87.58 bpm Rate Pressure Product: 41660   Dose of Adenosine (mg):  n/a Dose of Lexiscan: n/a mg  Dose of Atropine (mg): n/a Dose of Dobutamine: n/a mcg/kg/min (at max HR)  Stress Test Technologist: Smiley Houseman, CMA-N  Nuclear Technologist:  Domenic Polite, CNMT     Rest Procedure:  Myocardial perfusion imaging was performed at rest 45 minutes following the intravenous administration of Technetium 87m Tetrofosmin.  Rest ECG: Nonspecific ST-T wave changes with occasional PVC's.  Stress Procedure:  The patient exercised on the treadmill utilizing the Bruce protocol for 10:02 minutes. He then stopped due to fatigue and moderate dyspnea.  He denied any chest pain.  There were no diagnostic ST-T wave changes.  There were occasional PAC's and PVC's noted.  Technetium 30m Tetrofosmin was injected at peak exercise and myocardial perfusion imaging was performed after a brief delay.  Stress ECG: No significant change from baseline ECG  QPS Raw Data Images:  Normal; no motion artifact; normal heart/lung ratio. Stress Images:  Small, mild basal inferior perfusion defect.  Rest Images:  Normal homogeneous uptake in all areas of the myocardium. Subtraction (SDS):  Reversible small, mild basal inferior perfusion defect.  Transient Ischemic Dilatation (Normal <1.22):  0.87 Lung/Heart Ratio (Normal <0.45):  0.44  Quantitative Gated Spect Images QGS EDV:  86 ml QGS ESV:  36 ml  Impression Exercise Capacity:  Good exercise capacity. BP Response:  Hypertensive blood pressure response. Clinical Symptoms:  Fatigue, no chest pain.  ECG Impression:  No significant ST segment change suggestive of ischemia.  PVCs in recovery.  Comparison with Prior Nuclear Study: No images to compare  Overall Impression:  Low risk stress nuclear study.  Small, mild basal inferior perfusion defect is reversible and may be due to ischemia.  I do not have the images from the 2011 study, but report describes it as showing a small area of inferior basilar ischemia.  I doubt that there has been a significant change from the prior study.   LV Ejection Fraction: 57%.  LV Wall Motion:  NL LV Function; NL Wall Motion  Marca Ancona 01/17/2012

## 2012-01-31 ENCOUNTER — Other Ambulatory Visit: Payer: Self-pay | Admitting: Internal Medicine

## 2012-01-31 ENCOUNTER — Other Ambulatory Visit: Payer: Self-pay | Admitting: Cardiovascular Disease

## 2012-01-31 NOTE — Telephone Encounter (Signed)
PATIENT REQUEST REFILL ON ALPRAZOLAM . REQEUST 90 DAY SUPPLY.?

## 2012-02-01 MED ORDER — ALPRAZOLAM 0.5 MG PO TABS: 0.5000 mg | ORAL_TABLET | Freq: Every day | ORAL | Status: DC | PRN

## 2012-02-01 NOTE — Addendum Note (Signed)
Addended by: Elnora Morrison on: 02/01/2012 10:33 AM   Modules accepted: Orders

## 2012-02-01 NOTE — Telephone Encounter (Signed)
REFILL PRINTED AND FAXED TO [facility]

## 2012-03-28 ENCOUNTER — Other Ambulatory Visit: Payer: Self-pay | Admitting: Cardiovascular Disease

## 2012-05-09 DIAGNOSIS — L570 Actinic keratosis: Secondary | ICD-10-CM | POA: Diagnosis not present

## 2012-05-09 DIAGNOSIS — D239 Other benign neoplasm of skin, unspecified: Secondary | ICD-10-CM | POA: Diagnosis not present

## 2012-05-09 DIAGNOSIS — L821 Other seborrheic keratosis: Secondary | ICD-10-CM | POA: Diagnosis not present

## 2012-07-02 ENCOUNTER — Other Ambulatory Visit: Payer: Self-pay | Admitting: Cardiovascular Disease

## 2012-08-22 DIAGNOSIS — E291 Testicular hypofunction: Secondary | ICD-10-CM | POA: Diagnosis not present

## 2012-09-26 ENCOUNTER — Telehealth: Payer: Self-pay | Admitting: Cardiovascular Disease

## 2012-09-26 DIAGNOSIS — I714 Abdominal aortic aneurysm, without rupture, unspecified: Secondary | ICD-10-CM

## 2012-09-26 DIAGNOSIS — I739 Peripheral vascular disease, unspecified: Secondary | ICD-10-CM

## 2012-09-26 NOTE — Telephone Encounter (Signed)
New problem   Pt has an appt w/cooper on 12/29/12 @ 10:15a but wanted to know if he should have a stress test before this appt

## 2012-09-26 NOTE — Telephone Encounter (Signed)
Spoke to Amy at [facility] wanting to know if ok for patient to hold plavix 5 days prior to colonoscopy.Message sent to Dr.Cooper for advice.

## 2012-09-26 NOTE — Telephone Encounter (Signed)
Spoke with Joshua Brown, he is wondering if he needs a nuclear stress test or ABI's prior to his follow up appt. He reports he has a constant "feeling" in his lower heart area mainly with exertion that is relieved with rest. He also reports getting tired faster than he used to but feels this maybe related to his PAD. He denies SOB. He reports the same feeling in his chest when he rolls over in bed. The Joshua Brown has a scheduled follow up with dr Excell Seltzer in June. Will forward for dr cooper review and orders regarding testing. Joshua Brown agreed with this plan.

## 2012-09-26 NOTE — Telephone Encounter (Signed)
New PRoblem:    Called in wantinmg to know if the patient would be able to stop their clopidogrel (PLAVIX) 75 MG tablet 5 days prior to an upcoming procedure.  Please call back.

## 2012-09-27 NOTE — Telephone Encounter (Signed)
Fax note to Amy at [facility] ok with Dr.Cooper for patient to hold plavix for procedure. Fax # [phone].

## 2012-09-27 NOTE — Telephone Encounter (Signed)
Ok to hold plavix for procedure. thx

## 2012-09-27 NOTE — Telephone Encounter (Signed)
He should have ABI's and an abdominal aortic duplex ultrasound. Does not need a stress test prior to the office visit.

## 2012-09-28 NOTE — Telephone Encounter (Signed)
Pt aware of what testing is needed. Forwarded to [facility] to schedule per his request.

## 2012-09-28 NOTE — Addendum Note (Signed)
Addended by: Freddi Starr on: 09/28/2012 11:09 AM   Modules accepted: Orders

## 2012-10-03 DIAGNOSIS — E1139 Type 2 diabetes mellitus with other diabetic ophthalmic complication: Secondary | ICD-10-CM | POA: Diagnosis not present

## 2012-10-11 ENCOUNTER — Other Ambulatory Visit: Payer: Self-pay | Admitting: Internal Medicine

## 2012-10-12 NOTE — Telephone Encounter (Signed)
Alprazolam called to pharmacy  

## 2012-11-08 DIAGNOSIS — Z5181 Encounter for therapeutic drug level monitoring: Secondary | ICD-10-CM | POA: Diagnosis not present

## 2012-11-08 DIAGNOSIS — Z7901 Long term (current) use of anticoagulants: Secondary | ICD-10-CM | POA: Diagnosis not present

## 2012-11-09 DIAGNOSIS — Z86010 Personal history of colonic polyps: Secondary | ICD-10-CM | POA: Diagnosis not present

## 2012-11-09 DIAGNOSIS — D126 Benign neoplasm of colon, unspecified: Secondary | ICD-10-CM | POA: Diagnosis not present

## 2012-11-29 ENCOUNTER — Encounter: Payer: Self-pay | Admitting: Internal Medicine

## 2012-11-29 ENCOUNTER — Other Ambulatory Visit (INDEPENDENT_AMBULATORY_CARE_PROVIDER_SITE_OTHER): Payer: Medicare Other

## 2012-11-29 ENCOUNTER — Ambulatory Visit (INDEPENDENT_AMBULATORY_CARE_PROVIDER_SITE_OTHER): Payer: Medicare Other | Admitting: Internal Medicine

## 2012-11-29 VITALS — BP 114/70 | HR 66 | Temp 97.7°F | Ht 66.75 in | Wt 188.0 lb

## 2012-11-29 DIAGNOSIS — I10 Essential (primary) hypertension: Secondary | ICD-10-CM

## 2012-11-29 DIAGNOSIS — G4733 Obstructive sleep apnea (adult) (pediatric): Secondary | ICD-10-CM

## 2012-11-29 DIAGNOSIS — E785 Hyperlipidemia, unspecified: Secondary | ICD-10-CM | POA: Diagnosis not present

## 2012-11-29 DIAGNOSIS — I714 Abdominal aortic aneurysm, without rupture: Secondary | ICD-10-CM

## 2012-11-29 DIAGNOSIS — K219 Gastro-esophageal reflux disease without esophagitis: Secondary | ICD-10-CM | POA: Diagnosis not present

## 2012-11-29 DIAGNOSIS — I739 Peripheral vascular disease, unspecified: Secondary | ICD-10-CM

## 2012-11-29 DIAGNOSIS — Z0000 Encounter for general adult medical examination without abnormal findings: Secondary | ICD-10-CM

## 2012-11-29 LAB — COMPREHENSIVE METABOLIC PANEL
ALT: 32 U/L (ref 0–53)
Albumin: 3.9 g/dL (ref 3.5–5.2)
BUN: 18 mg/dL (ref 6–23)
CO2: 28 mEq/L (ref 19–32)
Calcium: 9.6 mg/dL (ref 8.4–10.5)
Chloride: 105 mEq/L (ref 96–112)
Creatinine, Ser: 1.3 mg/dL (ref 0.4–1.5)
GFR: 59.92 mL/min — ABNORMAL LOW (ref >60.00)
Potassium: 4.8 mEq/L (ref 3.5–5.1)

## 2012-11-29 LAB — LIPID PANEL
LDL Cholesterol: 59 mg/dL (ref 0–99)
Total CHOL/HDL Ratio: 3
VLDL: 24.4 mg/dL (ref 0.0–40.0)

## 2012-11-29 LAB — HEPATIC FUNCTION PANEL
Alkaline Phosphatase: 64 U/L (ref 39–117)
Bilirubin, Direct: 0 mg/dL (ref 0.0–0.3)

## 2012-11-29 NOTE — Progress Notes (Signed)
Patient ID: Joshua Brown, male   DOB: July 12, 1945, 67 y.o.   MRN: [id number] The patient is here for annual Medicare wellness examination and management of other chronic and acute problems.  Interval history: has PAD LE  Can walk a mile. Will be seeing Dr. Excell Seltzer.   Had colonoscopy in April/ Dr. Kinnie Scales -two benign polyps.    The risk factors are reflected in the social history.  The roster of all physicians providing medical care to patient - is listed in the Snapshot section of the chart.  Activities of daily living:  The patient is 100% inedpendent in all ADLs: dressing, toileting, feeding as well as independent mobility  Home safety : The patient has smoke detectors in the home. Falls - none. Home is not fall safe. They wear seatbelts. No firearms at home. There is no violence in the home.   There is no risks for hepatitis, STDs or HIV. There is no history of blood transfusion. They have no travel history to infectious disease endemic areas of the world.  The patient has seen their dentist in the last six month. They have seen their eye doctor in the last year. They deny any hearing difficulty and have not had audiologic testing in the last year.  They do not  have excessive sun exposure. Discussed the need for sun protection: hats, long sleeves and use of sunscreen if there is significant sun exposure.   Diet: the importance of a healthy diet is discussed. They do have a healthy diet.  The patient has a regular exercise program: walking , 30 minduration, 3 per week.  The benefits of regular aerobic exercise were discussed.  Depression screen: there are no signs or vegative symptoms of depression- irritability, change in appetite, anhedonia, sadness/tearfullness.  Cognitive assessment: the patient manages all their financial and personal affairs and is actively engaged.   The following portions of the patient's history were reviewed and updated as appropriate: allergies, current  medications, past family history, past medical history,  past surgical history, past social history  and problem list.  Vision, hearing, body mass index were assessed and reviewed.   During the course of the visit the patient was educated and counseled about appropriate screening and preventive services including : fall prevention , diabetes screening, nutrition counseling, colorectal cancer screening, and recommended immunizations.  Past Medical History  Diagnosis Date  . Myocardial infarct, old   . Hypertension   . ED (erectile dysfunction)   . Hyperglycemia     Borderline  . Obesity     Class I  . PVD (peripheral vascular disease)     ABI June '12 -0.9 bilateral.  . Frequent urination at night     nocturia 2-3  . GERD (gastroesophageal reflux disease)   . Hyperlipidemia     Atherogenic  . CAD (coronary artery disease)     premature  . DDD (degenerative disc disease)     lumbar spine  . ABDOMINAL AORTIC ANEURYSM 11/12/2008   Past Surgical History  Procedure Laterality Date  . Coronary artery bypass graft    . Tonsillectomy      age 67  . Colonoscopy      one month ago   Family History  Problem Relation Age of Onset  . Cancer Mother     Jaw Cancer  . Hyperlipidemia Mother   . Heart disease Mother     CHF  . Early death Father   . Heart disease Father  CAD/MI  . Cancer Brother     prostate  . Heart disease Paternal Grandfather     CAD/MI   History   Social History  . Marital Status: Married    Spouse Name: N/A    Number of Children: 3  . Years of Education: 17   Occupational History  . [employer]     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   Social History Main Topics  . Smoking status: Former Smoker -- 1.00 packs/day for 16 years    Types: Cigarettes    Quit date: 07/05/1969  . Smokeless tobacco: Never Used  . Alcohol Use: 1.0 oz/week    2 drink(s) per week  . Drug Use: No  . Sexually Active: Yes -- Male partner(s)   Other Topics Concern  .  Not on file   Social History Narrative   HSG, [employer] 1 year. Married 1977. 1 son - 77; 2 daughters - 83, 71; 3  Grandchildren. Work - self - employed Fish farm manager. Marriage in good health. ACP - discussed. Provided packet May '13.   Current Outpatient Prescriptions on File Prior to Visit  Medication Sig Dispense Refill  . ALPRAZolam (XANAX) 0.5 MG tablet TAKE ONE TABLET BY MOUTH EVERY DAY AS NEEDED FOR SLEEP  60 tablet  0  . aspirin 81 MG EC tablet Take 81 mg by mouth daily.        Marland Kitchen atorvastatin (LIPITOR) 80 MG tablet TAKE ONE TABLET BY MOUTH EVERY DAY  90 tablet  6  . b complex vitamins capsule Take 1 capsule by mouth daily.        . Cholecalciferol (VITAMIN D) 2000 UNIT CAPS Take 2,000 capsules by mouth daily.        . clopidogrel (PLAVIX) 75 MG tablet TAKE ONE TABLET BY MOUTH EVERY DAY  90 tablet  6  . metoprolol succinate (TOPROL-XL) 100 MG 24 hr tablet TAKE ONE-HALF TABLET BY MOUTH EVERY DAY  45 tablet  9  . niacin (NIASPAN) 500 MG CR tablet Take 2,000 mg by mouth at bedtime.        . pantoprazole (PROTONIX) 40 MG tablet TAKE ONE TABLET BY MOUTH EVERY DAY  90 tablet  6  . tadalafil (CIALIS) 10 MG tablet Take 10 mg by mouth daily as needed.        Marland Kitchen ZETIA 10 MG tablet TAKE ONE TABLET BY MOUTH EVERY DAY  90 tablet  6   No current facility-administered medications on file prior to visit.    ROS: Constitutional:  Negative for fever, chills, activity change and unexpected weight change.  HEENT:  Negative for hearing loss, ear pain, congestion, neck stiffness and postnasal drip. Negative for sore throat or swallowing problems. Negative for dental complaints.   Eyes: Negative for vision loss or change in visual acuity.  Respiratory: Negative for chest tightness and wheezing. Negative for DOE.   Cardiovascular: Negative for chest pain or palpitations. No decreased exercise tolerance Gastrointestinal: No change in bowel habit. No bloating or gas. No reflux or  indigestion Genitourinary: Negative for urgency, frequency, flank pain and difficulty urinating.  Musculoskeletal: Negative for myalgias, back pain, arthralgias and gait problem.  Neurological: Negative for dizziness, tremors, weakness and headaches.  Hematological: Negative for adenopathy.  Psychiatric/Behavioral: Negative for behavioral problems and dysphoric mood.   PE:  Filed Vitals:   11/29/12 1008  BP: 114/70  Pulse: 66  Temp: 97.7 F (36.5 C)   Wt Readings from Last 3 Encounters:  11/29/12  188 lb (85.276 kg)  01/17/12 175 lb (79.379 kg)  12/28/11 184 lb (83.462 kg)   Gen'l: Well nourished well developed white male in no acute distress  HEENT: Head: Normocephalic and atraumatic. Right Ear: External ear normal. EAC/TM nl. Left Ear: External ear normal.  EAC/TM nl. Nose: Nose normal. Mouth/Throat: Oropharynx is clear and moist. Dentition - native, in good repair. No buccal or palatal lesions. Posterior pharynx clear. Eyes: Conjunctivae and sclera clear. EOM intact. Pupils are equal, round, and reactive to light. Right eye exhibits no discharge. Left eye exhibits no discharge. Neck: Normal range of motion. Neck supple. No JVD present. No tracheal deviation present. No thyromegaly present.  Cardiovascular: Normal rate, regular rhythm, no gallop, no friction rub, no murmur heard.      Quiet precordium. 2+ radial and DP pulses . No carotid bruits Pulmonary/Chest: Effort normal. No respiratory distress or increased WOB, no wheezes, no rales. No chest wall deformity or CVAT. Abdomen: Soft. Bowel sounds are normal in all quadrants. He exhibits no distension, no tenderness, no rebound or guarding, No heptosplenomegaly  Genitourinary:  deferred Musculoskeletal: Normal range of motion. He exhibits no edema and no tenderness.       Small and large joints without redness, synovial thickening or deformity. Full range of motion preserved about all small, median and large joints.   Lymphadenopathy:    He has no cervical or supraclavicular adenopathy.  Neurological: He is alert and oriented to person, place, and time. CN II-XII intact. DTRs 2+ and symmetrical biceps, radial and patellar tendons. Cerebellar function normal with no tremor, rigidity, normal gait and station.  Skin: Skin is warm and dry. No rash noted. No erythema.  Psychiatric: He has a normal mood and affect. His behavior is normal. Thought content normal.   Recent Results (from the past 2160 hour(s))  LIPID PANEL     Status: None   Collection Time    11/29/12 11:41 AM      Result Value Range   Cholesterol 125  0 - 200 mg/dL   Comment: ATP III Classification       Desirable:  < 200 mg/dL               Borderline High:  200 - 239 mg/dL          High:  > = 454 mg/dL   Triglycerides 098.1  0.0 - 149.0 mg/dL   Comment: Normal:  <191 mg/dLBorderline High:  150 - 199 mg/dL   HDL 47.82  >95.62 mg/dL   VLDL 13.0  0.0 - 86.5 mg/dL   LDL Cholesterol 59  0 - 99 mg/dL   Total CHOL/HDL Ratio 3     Comment:                Men          Women1/2 Average Risk     3.4          3.3Average Risk          5.0          4.42X Average Risk          9.6          7.13X Average Risk          15.0          11.0                      HEPATIC FUNCTION PANEL     Status: None  Collection Time    11/29/12 11:41 AM      Result Value Range   Total Bilirubin 0.5  0.3 - 1.2 mg/dL   Bilirubin, Direct 0.0  0.0 - 0.3 mg/dL   Alkaline Phosphatase 64  39 - 117 U/L   AST 31  0 - 37 U/L   ALT 32  0 - 53 U/L   Total Protein 7.0  6.0 - 8.3 g/dL   Albumin 3.9  3.5 - 5.2 g/dL  COMPREHENSIVE METABOLIC PANEL     Status: Abnormal   Collection Time    11/29/12 11:41 AM      Result Value Range   Sodium 139  135 - 145 mEq/L   Potassium 4.8  3.5 - 5.1 mEq/L   Chloride 105  96 - 112 mEq/L   CO2 28  19 - 32 mEq/L   Glucose, Bld 100 (*) 70 - 99 mg/dL   BUN 18  6 - 23 mg/dL   Creatinine, Ser 1.3  0.4 - 1.5 mg/dL   Total Bilirubin 0.5  0.3 - 1.2  mg/dL   Alkaline Phosphatase 64  39 - 117 U/L   AST 31  0 - 37 U/L   ALT 32  0 - 53 U/L   Total Protein 7.0  6.0 - 8.3 g/dL   Albumin 3.9  3.5 - 5.2 g/dL   Calcium 9.6  8.4 - 16.1 mg/dL   GFR 09.60 (*) >45.40 mL/min

## 2012-11-29 NOTE — Patient Instructions (Addendum)
Thanks for coming in. All looks good. Labs are ordered and will be available on MyChart.  Please get the shingles vaccine - ZostaVax - Rx provided. Need date of last tetanus shot.  For advanced care planning - check out [email or web address].  Come back as needed or in a year.

## 2012-12-02 NOTE — Assessment & Plan Note (Signed)
Interval history - by report he has been medically stable. Physical exam is unremarkable. He is current with colorectal cancer screening. Discussed pros and cons of prostate cancer screening (USPHCTF recommendations reviewed and ACU April '13 recommendations) and he defers  evaluation at this time with last PSA '11 of 0.47 and stable. He is current with immunizations but in accord with latest CDC recommendations he is a candidate for shingles vaccine even though he has had shingles in the past.   In summary - a nice man who appears to be medically stable with a complex set of issues. He has follow up with Dr. Excell Seltzer scheduled and is follow by sleep medicine. He will return in 1 year or sooner as needed.

## 2012-12-02 NOTE — Assessment & Plan Note (Signed)
Has exercised induced drop in ABI with good recovery. He can walk a mile. Risk factor modification in place with good BP control and lipid control.  Plan Continue risk factor modification  Continue regular exercise program  Follow up with Dr. Excell Seltzer as scheduled.

## 2012-12-02 NOTE — Assessment & Plan Note (Signed)
BP Readings from Last 3 Encounters:  11/29/12 114/70  01/17/12 116/65  12/28/11 108/72   Excellent control of BP on present medications.  PLan Continue present regimen

## 2012-12-02 NOTE — Assessment & Plan Note (Signed)
Stable on PPI therapy: no symptoms. No EGD in EPIC  Plan Continue PPI therapy

## 2012-12-02 NOTE — Assessment & Plan Note (Signed)
Last Aorta Duplex study December 23, 2011 - no aneurysm

## 2012-12-02 NOTE — Assessment & Plan Note (Signed)
Tolerating 3 drug regimen without adverse side effects.  Lipid panel reveals HDL, LDL and Triglycerides to be at goal.  Plan Continue present regimen.

## 2012-12-02 NOTE — Assessment & Plan Note (Signed)
Stable. Continues with CPAP. Follows with Dr. Shelle Iron on regular basis.

## 2012-12-25 ENCOUNTER — Encounter (INDEPENDENT_AMBULATORY_CARE_PROVIDER_SITE_OTHER): Payer: Medicare Other

## 2012-12-25 DIAGNOSIS — I70219 Atherosclerosis of native arteries of extremities with intermittent claudication, unspecified extremity: Secondary | ICD-10-CM | POA: Diagnosis not present

## 2012-12-25 DIAGNOSIS — I700 Atherosclerosis of aorta: Secondary | ICD-10-CM

## 2012-12-25 DIAGNOSIS — I714 Abdominal aortic aneurysm, without rupture, unspecified: Secondary | ICD-10-CM

## 2012-12-25 DIAGNOSIS — I739 Peripheral vascular disease, unspecified: Secondary | ICD-10-CM

## 2012-12-29 ENCOUNTER — Encounter: Payer: Self-pay | Admitting: Cardiovascular Disease

## 2012-12-29 ENCOUNTER — Ambulatory Visit (INDEPENDENT_AMBULATORY_CARE_PROVIDER_SITE_OTHER): Payer: Medicare Other | Admitting: Cardiovascular Disease

## 2012-12-29 VITALS — BP 124/88 | HR 66 | Ht 67.0 in | Wt 191.0 lb

## 2012-12-29 DIAGNOSIS — E785 Hyperlipidemia, unspecified: Secondary | ICD-10-CM | POA: Diagnosis not present

## 2012-12-29 DIAGNOSIS — I2510 Atherosclerotic heart disease of native coronary artery without angina pectoris: Secondary | ICD-10-CM | POA: Diagnosis not present

## 2012-12-29 DIAGNOSIS — I714 Abdominal aortic aneurysm, without rupture: Secondary | ICD-10-CM

## 2012-12-29 NOTE — Patient Instructions (Addendum)
Your physician wants you to follow-up in: 1 YEAR with Dr Cooper.  You will receive a reminder letter in the mail two months in advance. If you don't receive a letter, please call our office to schedule the follow-up appointment.  Your physician recommends that you continue on your current medications as directed. Please refer to the Current Medication list given to you today.  

## 2012-12-29 NOTE — Progress Notes (Signed)
HPI:  67 year old gentleman presenting for followup evaluation. He has coronary artery disease status post remote myocardial infarction treated with CABG. He's also followed for peripheral arterial disease with distal abdominal aortic stenosis but normal ABIs. He has never required intervention for his PAD. He had recent ABIs are 1.0 bilaterally. He had stable aortoiliac stenosis by duplex scanning. His last carotid scan was in June 2013 and this demonstrated less than 40% bilateral ICA stenosis. He underwent a nuclear stress test in July 2013 after complaining of chest discomfort. This demonstrated a small, mild basal inferior perfusion defect with reversibility. Overall this was felt to be a low risk study. His left ventricular ejection fraction was 57%. Last lipids from May 28 showed cholesterol of 125, triglycerides 122, HDL 42, and LDL 59. Liver function tests were within normal limits.  He's doing well. Has not been regular with an exercise program. He denies chest pain, chest pressure, dyspnea, lightheadedness, or syncope. He does admit to leg fatigue with walking but can't oh about 1 mile without stopping. He does not have typical symptoms of calf claudication.  Outpatient Encounter Prescriptions as of 12/29/2012  Medication Sig Dispense Refill  . ALPRAZolam (XANAX) 0.5 MG tablet TAKE ONE TABLET BY MOUTH EVERY DAY AS NEEDED FOR SLEEP  60 tablet  0  . aspirin 81 MG EC tablet Take 81 mg by mouth daily.        Marland Kitchen atorvastatin (LIPITOR) 80 MG tablet TAKE ONE TABLET BY MOUTH EVERY DAY  90 tablet  6  . b complex vitamins capsule Take 1 capsule by mouth daily.        . Cholecalciferol (VITAMIN D) 2000 UNIT CAPS Take 2,000 capsules by mouth daily.        . clopidogrel (PLAVIX) 75 MG tablet TAKE ONE TABLET BY MOUTH EVERY DAY  90 tablet  6  . metoprolol succinate (TOPROL-XL) 50 MG 24 hr tablet Take 50 mg by mouth daily. Take with or immediately following a meal.      . niacin (NIASPAN) 500 MG CR tablet  Take 2,000 mg by mouth at bedtime.        . pantoprazole (PROTONIX) 40 MG tablet TAKE ONE TABLET BY MOUTH EVERY DAY  90 tablet  6  . tadalafil (CIALIS) 10 MG tablet Take 10 mg by mouth daily as needed.        Marland Kitchen ZETIA 10 MG tablet TAKE ONE TABLET BY MOUTH EVERY DAY  90 tablet  6  . [DISCONTINUED] losartan (COZAAR) 100 MG tablet       . [DISCONTINUED] metoprolol succinate (TOPROL-XL) 100 MG 24 hr tablet TAKE ONE-HALF TABLET BY MOUTH EVERY DAY  45 tablet  9   No facility-administered encounter medications on file as of 12/29/2012.    No Known Allergies  Past Medical History  Diagnosis Date  . Myocardial infarct, old   . Hypertension   . ED (erectile dysfunction)   . Hyperglycemia     Borderline  . Obesity     Class I  . PVD (peripheral vascular disease)     ABI June '12 -0.9 bilateral.  . Frequent urination at night     nocturia 2-3  . GERD (gastroesophageal reflux disease)   . Hyperlipidemia     Atherogenic  . CAD (coronary artery disease)     premature  . DDD (degenerative disc disease)     lumbar spine  . ABDOMINAL AORTIC ANEURYSM 11/12/2008    has annual U/s    ROS:  Negative except as per HPI  BP 124/88  Pulse 66  Ht 5\' 7"  (1.702 m)  Wt 191 lb (86.637 kg)  BMI 29.91 kg/m2  PHYSICAL EXAM: Pt is alert and oriented, NAD HEENT: normal Neck: JVP - normal, carotids 2+= without bruits Lungs: CTA bilaterally CV: RRR without murmur or gallop Abd: soft, NT, Positive BS, no hepatomegaly Ext: no C/C/E, distal pulses intact and equal Skin: warm/dry no rash  EKG:  Normal sinus rhythm with frequent PVCs, heart rate 66 beats per minute, T-wave abnormality consider inferior ischemia.  ASSESSMENT AND PLAN: 1. Coronary artery disease status post CABG. Low risk nuclear scan last year. No anginal symptoms at present. Will continue his current medical program without changes. Meds were reviewed and he is on aggressive risk reduction program.  2. Lower extremity peripheral arterial  disease with abdominal aortic stenosis. Mild lifestyle limiting symptoms of leg weakness, but overall good functional capacity. Continue medical management without changes. Followup in one year.  3. Hyperlipidemia. Lipids reviewed as above. He is on multidrug therapy with niacin, zetia, and atorvastatin.  I will see him back in 12 months.  Tonny Bollman 12/29/2012 10:54 AM

## 2013-02-15 ENCOUNTER — Other Ambulatory Visit: Payer: Self-pay | Admitting: *Deleted

## 2013-02-15 MED ORDER — PANTOPRAZOLE SODIUM 40 MG PO TBEC: DELAYED_RELEASE_TABLET | ORAL | Status: DC

## 2013-02-15 MED ORDER — ATORVASTATIN CALCIUM 80 MG PO TABS: ORAL_TABLET | ORAL | Status: DC

## 2013-02-15 MED ORDER — CLOPIDOGREL BISULFATE 75 MG PO TABS: ORAL_TABLET | ORAL | Status: DC

## 2013-02-15 MED ORDER — METOPROLOL SUCCINATE ER 50 MG PO TB24: 50.0000 mg | ORAL_TABLET | Freq: Every day | ORAL | Status: DC

## 2013-02-15 MED ORDER — EZETIMIBE 10 MG PO TABS: ORAL_TABLET | ORAL | Status: DC

## 2013-02-19 ENCOUNTER — Other Ambulatory Visit: Payer: Self-pay

## 2013-02-19 ENCOUNTER — Other Ambulatory Visit: Payer: Self-pay | Admitting: *Deleted

## 2013-02-19 MED ORDER — LOSARTAN POTASSIUM 25 MG PO TABS: 25.0000 mg | ORAL_TABLET | Freq: Every day | ORAL | Status: DC

## 2013-02-28 ENCOUNTER — Other Ambulatory Visit: Payer: Self-pay | Admitting: Internal Medicine

## 2013-02-28 NOTE — Telephone Encounter (Signed)
Alprazolam called to pharmacy  

## 2013-04-17 ENCOUNTER — Telehealth: Payer: Self-pay | Admitting: *Deleted

## 2013-04-17 NOTE — Telephone Encounter (Signed)
Pt called requesting Alprazolam refill.  Please advise 

## 2013-04-18 NOTE — Telephone Encounter (Signed)
Pt called states he no longer needs Dr Debby Bud to fill this medication.

## 2013-04-20 ENCOUNTER — Telehealth: Payer: Self-pay

## 2013-04-20 NOTE — Telephone Encounter (Signed)
got a rx for clopidogrel sent a fax back because this med was refilled on 02/15/13 for 90 tablets with 3 additional refills

## 2013-04-23 ENCOUNTER — Telehealth: Payer: Self-pay | Admitting: *Deleted

## 2013-04-23 NOTE — Telephone Encounter (Signed)
Verbal order for plavix 90 day supply given to [facility]

## 2013-05-10 ENCOUNTER — Other Ambulatory Visit: Payer: Self-pay

## 2013-08-25 ENCOUNTER — Ambulatory Visit: Payer: Medicare Other | Admitting: Family Medicine

## 2013-08-27 ENCOUNTER — Ambulatory Visit (INDEPENDENT_AMBULATORY_CARE_PROVIDER_SITE_OTHER)
Admission: RE | Admit: 2013-08-27 | Discharge: 2013-08-27 | Disposition: A | Discharge status: Home / Self Care | Payer: Medicare Other | Source: Ambulatory Visit | Attending: Internal Medicine | Admitting: Internal Medicine

## 2013-08-27 ENCOUNTER — Ambulatory Visit (INDEPENDENT_AMBULATORY_CARE_PROVIDER_SITE_OTHER): Payer: Medicare Other | Admitting: Internal Medicine

## 2013-08-27 ENCOUNTER — Encounter: Payer: Self-pay | Admitting: Internal Medicine

## 2013-08-27 VITALS — BP 130/70 | HR 77 | Temp 98.0°F | Wt 180.0 lb

## 2013-08-27 DIAGNOSIS — R059 Cough, unspecified: Secondary | ICD-10-CM

## 2013-08-27 DIAGNOSIS — R05 Cough: Secondary | ICD-10-CM

## 2013-08-27 MED ORDER — PROMETHAZINE-CODEINE 6.25-10 MG/5ML PO SYRP: 5.0000 mL | ORAL_SOLUTION | Freq: Four times a day (QID) | ORAL | Status: DC | PRN

## 2013-08-27 MED ORDER — BENZONATATE 100 MG PO CAPS: 100.0000 mg | ORAL_CAPSULE | Freq: Two times a day (BID) | ORAL | Status: DC | PRN

## 2013-08-27 MED ORDER — PROMETHAZINE-CODEINE 6.25-10 MG/5ML PO SYRP: 5.0000 mL | ORAL_SOLUTION | ORAL | Status: DC | PRN

## 2013-08-27 MED ORDER — PREDNISONE 20 MG PO TABS: 20.0000 mg | ORAL_TABLET | Freq: Every day | ORAL | Status: DC

## 2013-08-27 NOTE — Progress Notes (Signed)
Subjective:    Patient ID: Joshua Brown, male    DOB: October 11, 1945, 68 y.o.   MRN: [id number]  HPI Joshua Brown presents with a 2 week + h/o dry hacking cough with some bloody mucinex. He has been taking mucinex w/o help. The cough is triggered by talking, coughs through the night. No fever, no rigors, no dyspnea or SOB. No wheezing.   Past Medical History  Diagnosis Date  . Myocardial infarct, old   . Hypertension   . ED (erectile dysfunction)   . Hyperglycemia     Borderline  . Obesity     Class I  . PVD (peripheral vascular disease)     ABI June '12 -0.9 bilateral.  . Frequent urination at night     nocturia 2-3  . GERD (gastroesophageal reflux disease)   . Hyperlipidemia     Atherogenic  . CAD (coronary artery disease)     premature  . DDD (degenerative disc disease)     lumbar spine  . ABDOMINAL AORTIC ANEURYSM 11/12/2008    has annual U/s   Past Surgical History  Procedure Laterality Date  . Coronary artery bypass graft    . Tonsillectomy      age 89  . Colonoscopy      one month ago   Family History  Problem Relation Age of Onset  . Cancer Mother     Jaw Cancer  . Hyperlipidemia Mother   . Heart disease Mother     CHF  . Early death Father   . Heart disease Father     CAD/MI  . Cancer Brother     prostate  . Heart disease Paternal Grandfather     CAD/MI   History   Social History  . Marital Status: Married    Spouse Name: N/A    Number of Children: 3  . Years of Education: 5   Occupational History  . 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 Smoking status: Former Smoker -- 1.00 packs/day for 16 years    Types: Cigarettes    Quit date: 07/05/1969  . Smokeless tobacco: Never Used  . Alcohol Use: 1.0 oz/week    2 drink(s) per week  . Drug Use: No  . Sexual Activity: Yes    Partners: Female   Other Topics Concern  . Not on file   Social History Narrative   HSG, [employer] 1 year. Married 1977.  1 son - 69; 2 daughters - 85, 19; 3  Grandchildren. 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 Marriage in good health. ACP - discussed. Provided packet May '13.    Current Outpatient Prescriptions on File Prior to Visit  Medication Sig Dispense Refill  . aspirin 81 MG EC tablet Take 81 mg by mouth daily.        Marland Kitchen atorvastatin (LIPITOR) 80 MG tablet TAKE ONE TABLET BY MOUTH EVERY DAY  90 tablet  3  . b complex vitamins capsule Take 1 capsule by mouth daily.        . Cholecalciferol (VITAMIN D) 2000 UNIT CAPS Take 2,000 capsules by mouth daily.        . clopidogrel (PLAVIX) 75 MG tablet TAKE ONE TABLET BY MOUTH EVERY DAY  90 tablet  3  . ezetimibe (ZETIA) 10 MG tablet TAKE ONE TABLET BY MOUTH EVERY DAY  90 tablet  3  . losartan (COZAAR) 25 MG tablet Take 1  tablet (25 mg total) by mouth daily.  90 tablet  3  . metoprolol succinate (TOPROL-XL) 50 MG 24 hr tablet Take 1 tablet (50 mg total) by mouth daily. Take with or immediately following a meal.  90 tablet  3  . niacin (NIASPAN) 500 MG CR tablet Take 2,000 mg by mouth at bedtime.        . pantoprazole (PROTONIX) 40 MG tablet TAKE ONE TABLET BY MOUTH EVERY DAY  90 tablet  3  . tadalafil (CIALIS) 10 MG tablet Take 10 mg by mouth daily as needed.         No current facility-administered medications on file prior to visit.      Review of Systems System review is negative for any constitutional, cardiac, pulmonary, GI or neuro symptoms or complaints other than as described in the HPI.     Objective:   Physical Exam Filed Vitals:   08/27/13 1616  BP: 130/70  Pulse: 77  Temp: 98 F (36.7 C)   Wt Readings from Last 3 Encounters:  08/27/13 180 lb (81.647 kg)  12/29/12 191 lb (86.637 kg)  11/29/12 188 lb (85.276 kg)   gen'l- WNWD man in no distress HEENT - TMs normal, throat clear Nodes - negartive cervical region Cor - RRR Pulm - no increased WOB, no wheezing, clear A&P. Neuro - nonfocal.  CXR - IMPRESSION:  Post CABG.  Question  mild emphysematous changes.  No acute abnormalities.      Assessment & Plan:  Cyclical cough with no evidence of a bacterial infection. Something started the cough which started tracheal irritation which caused more cough leading to tracheal irritation to cough to irritation.  Plan No antibiotics  Phenergan with codeine cough syrup 1-2 tsp at bedtime, 1 tsp every 6 hours during the day if tolerated. If too drowsy can take robitussin DM during the day.  Tessalon perle 100 mg three times a day  Prednisone 20 mg once a day for 10 days.

## 2013-08-27 NOTE — Patient Instructions (Signed)
Cyclical cough with no evidence of a bacterial infection. Something started the cough which started tracheal irritation which caused more cough leading to tracheal irritation to cough to irritation.  Plan No antibiotics  Phenergan with codeine cough syrup 1-2 tsp at bedtime, 1 tsp every 6 hours during the day if tolerated. If too drowsy can take robitussin DM during the day.  Tessalon perle 100 mg three times a day  Prednisone 20 mg once a day for 10 days.

## 2013-08-27 NOTE — Progress Notes (Signed)
Pre visit review using our clinic review tool, if applicable. No additional management support is needed unless otherwise documented below in the visit note. 

## 2013-10-09 DIAGNOSIS — H40059 Ocular hypertension, unspecified eye: Secondary | ICD-10-CM | POA: Diagnosis not present

## 2013-10-09 DIAGNOSIS — E119 Type 2 diabetes mellitus without complications: Secondary | ICD-10-CM | POA: Diagnosis not present

## 2013-10-22 ENCOUNTER — Encounter: Payer: Self-pay | Admitting: Cardiovascular Disease

## 2013-11-22 ENCOUNTER — Encounter: Payer: Self-pay | Admitting: Internal Medicine

## 2013-11-22 ENCOUNTER — Ambulatory Visit (INDEPENDENT_AMBULATORY_CARE_PROVIDER_SITE_OTHER): Payer: Medicare Other | Admitting: Internal Medicine

## 2013-11-22 ENCOUNTER — Other Ambulatory Visit (INDEPENDENT_AMBULATORY_CARE_PROVIDER_SITE_OTHER): Payer: Medicare Other

## 2013-11-22 ENCOUNTER — Telehealth: Payer: Self-pay | Admitting: *Deleted

## 2013-11-22 VITALS — BP 126/80 | HR 76 | Temp 98.2°F | Resp 16 | Ht 67.0 in | Wt 188.0 lb

## 2013-11-22 DIAGNOSIS — E785 Hyperlipidemia, unspecified: Secondary | ICD-10-CM | POA: Diagnosis not present

## 2013-11-22 DIAGNOSIS — Z23 Encounter for immunization: Secondary | ICD-10-CM

## 2013-11-22 DIAGNOSIS — Z0000 Encounter for general adult medical examination without abnormal findings: Secondary | ICD-10-CM

## 2013-11-22 DIAGNOSIS — F528 Other sexual dysfunction not due to a substance or known physiological condition: Secondary | ICD-10-CM | POA: Diagnosis not present

## 2013-11-22 DIAGNOSIS — N320 Bladder-neck obstruction: Secondary | ICD-10-CM | POA: Diagnosis not present

## 2013-11-22 DIAGNOSIS — I10 Essential (primary) hypertension: Secondary | ICD-10-CM

## 2013-11-22 DIAGNOSIS — R739 Hyperglycemia, unspecified: Secondary | ICD-10-CM

## 2013-11-22 DIAGNOSIS — R7309 Other abnormal glucose: Secondary | ICD-10-CM

## 2013-11-22 DIAGNOSIS — I739 Peripheral vascular disease, unspecified: Secondary | ICD-10-CM

## 2013-11-22 DIAGNOSIS — G4733 Obstructive sleep apnea (adult) (pediatric): Secondary | ICD-10-CM

## 2013-11-22 DIAGNOSIS — I252 Old myocardial infarction: Secondary | ICD-10-CM

## 2013-11-22 DIAGNOSIS — I2510 Atherosclerotic heart disease of native coronary artery without angina pectoris: Secondary | ICD-10-CM | POA: Diagnosis not present

## 2013-11-22 LAB — CBC WITH DIFFERENTIAL/PLATELET
BASOS ABS: 0 10*3/uL (ref 0.0–0.1)
Basophils Relative: 0.2 % (ref 0.0–3.0)
EOS PCT: 3.4 % (ref 0.0–5.0)
Eosinophils Absolute: 0.3 10*3/uL (ref 0.0–0.7)
HEMATOCRIT: 45.7 % (ref 39.0–52.0)
Hemoglobin: 15.3 g/dL (ref 13.0–17.0)
LYMPHS ABS: 2.7 10*3/uL (ref 0.7–4.0)
LYMPHS PCT: 34.4 % (ref 12.0–46.0)
MCHC: 33.4 g/dL (ref 30.0–36.0)
MCV: 87.8 fl (ref 78.0–100.0)
MONOS PCT: 7 % (ref 3.0–12.0)
Monocytes Absolute: 0.6 10*3/uL (ref 0.1–1.0)
Neutro Abs: 4.3 10*3/uL (ref 1.4–7.7)
Neutrophils Relative %: 55 % (ref 43.0–77.0)
Platelets: 157 10*3/uL (ref 150.0–400.0)
RBC: 5.2 Mil/uL (ref 4.22–5.81)
RDW: 14.6 % (ref 11.5–15.5)
WBC: 7.9 10*3/uL (ref 4.0–10.5)

## 2013-11-22 LAB — HEPATIC FUNCTION PANEL
ALBUMIN: 4.2 g/dL (ref 3.5–5.2)
ALK PHOS: 70 U/L (ref 39–117)
ALT: 28 U/L (ref 0–53)
AST: 31 U/L (ref 0–37)
BILIRUBIN DIRECT: 0.1 mg/dL (ref 0.0–0.3)
Total Bilirubin: 0.7 mg/dL (ref 0.2–1.2)
Total Protein: 6.8 g/dL (ref 6.0–8.3)

## 2013-11-22 LAB — URINALYSIS
BILIRUBIN URINE: NEGATIVE
Hgb urine dipstick: NEGATIVE
Ketones, ur: NEGATIVE
LEUKOCYTES UA: NEGATIVE
Nitrite: NEGATIVE
PH: 6 (ref 5.0–8.0)
Specific Gravity, Urine: <=1.005 — AB (ref 1.000–1.030)
Total Protein, Urine: NEGATIVE
UROBILINOGEN UA: 0.2 (ref 0.0–1.0)
Urine Glucose: NEGATIVE

## 2013-11-22 LAB — BASIC METABOLIC PANEL
BUN: 14 mg/dL (ref 6–23)
CALCIUM: 9.6 mg/dL (ref 8.4–10.5)
CO2: 29 mEq/L (ref 19–32)
Chloride: 103 mEq/L (ref 96–112)
Creatinine, Ser: 1 mg/dL (ref 0.4–1.5)
GFR: 80.58 mL/min (ref >60.00)
Glucose, Bld: 114 mg/dL — ABNORMAL HIGH (ref 70–99)
POTASSIUM: 4.6 meq/L (ref 3.5–5.1)
SODIUM: 139 meq/L (ref 135–145)

## 2013-11-22 LAB — TSH: TSH: 1.34 u[IU]/mL (ref 0.35–4.50)

## 2013-11-22 LAB — LIPID PANEL
CHOL/HDL RATIO: 3
Cholesterol: 142 mg/dL (ref 0–200)
HDL: 44.8 mg/dL (ref >39.00)
LDL Cholesterol: 82 mg/dL (ref 0–99)
Triglycerides: 76 mg/dL (ref 0.0–149.0)
VLDL: 15.2 mg/dL (ref 0.0–40.0)

## 2013-11-22 LAB — HEMOGLOBIN A1C: Hgb A1c MFr Bld: 6.5 % (ref 4.6–6.5)

## 2013-11-22 LAB — PSA: PSA: 0.43 ng/mL (ref 0.10–4.00)

## 2013-11-22 MED ORDER — NITROGLYCERIN 0.4 MG SL SUBL: 0.4000 mg | SUBLINGUAL_TABLET | SUBLINGUAL | Status: DC | PRN

## 2013-11-22 NOTE — Patient Instructions (Signed)
Zostavax

## 2013-11-22 NOTE — Progress Notes (Signed)
Subjective:    HPI  New pt The patient is here for a wellness exam. The patient has been doing well overall without major physical or psychological issues going on lately. The patient needs to address  Chronic CAD, PVD,  hypertension that has been well controlled with medicines; to address chronic  hyperlipidemia controlled with medicines as well  BP Readings from Last 3 Encounters:  11/22/13 126/80  08/27/13 130/70  12/29/12 124/88   Wt Readings from Last 3 Encounters:  11/22/13 188 lb (85.276 kg)  08/27/13 180 lb (81.647 kg)  12/29/12 191 lb (86.637 kg)      Review of Systems  Constitutional: Negative for appetite change, fatigue and unexpected weight change.  HENT: Negative for congestion, nosebleeds, sneezing, sore throat and trouble swallowing.   Eyes: Negative for itching and visual disturbance.  Respiratory: Negative for cough.   Cardiovascular: Positive for chest pain. Negative for palpitations and leg swelling.  Gastrointestinal: Negative for nausea, diarrhea, blood in stool and abdominal distention.  Genitourinary: Negative for frequency and hematuria.  Musculoskeletal: Positive for myalgias. Negative for back pain, gait problem, joint swelling and neck pain.  Skin: Negative for rash.  Neurological: Negative for dizziness, tremors, speech difficulty and weakness.  Psychiatric/Behavioral: Negative for sleep disturbance, dysphoric mood and agitation. The patient is not nervous/anxious.        Objective:   Physical Exam  Constitutional: He is oriented to person, place, and time. He appears well-developed and well-nourished. No distress.  HENT:  Head: Normocephalic and atraumatic.  Right Ear: External ear normal.  Left Ear: External ear normal.  Nose: Nose normal.  Mouth/Throat: Oropharynx is clear and moist. No oropharyngeal exudate.  Eyes: Conjunctivae and EOM are normal. Pupils are equal, round, and reactive to light. Right eye exhibits no discharge. Left  eye exhibits no discharge. No scleral icterus.  Neck: Normal range of motion. Neck supple. No JVD present. No tracheal deviation present. No thyromegaly present.  Cardiovascular: Normal rate, regular rhythm, normal heart sounds and intact distal pulses.  Exam reveals no gallop and no friction rub.   No murmur heard. Pulmonary/Chest: Effort normal and breath sounds normal. No stridor. No respiratory distress. He has no wheezes. He has no rales. He exhibits no tenderness.  Abdominal: Soft. Bowel sounds are normal. He exhibits no distension and no mass. There is no tenderness. There is no rebound and no guarding.  Genitourinary: Rectum normal, prostate normal and penis normal. Guaiac negative stool. No penile tenderness.  Musculoskeletal: Normal range of motion. He exhibits no edema and no tenderness.  Lymphadenopathy:    He has no cervical adenopathy.  Neurological: He is alert and oriented to person, place, and time. He has normal reflexes. No cranial nerve deficit. He exhibits normal muscle tone. Coordination normal.  Skin: Skin is warm and dry. No rash noted. He is not diaphoretic. No erythema. No pallor.  Psychiatric: He has a normal mood and affect. His behavior is normal. Judgment and thought content normal.    Lab Results  Component Value Date   WBC 7.3 11/13/2009   HGB 16.0 11/13/2009   HCT 46.6 11/13/2009   PLT 163.0 11/13/2009   GLUCOSE 100* 11/29/2012   CHOL 125 11/29/2012   TRIG 122.0 11/29/2012   HDL 41.80 11/29/2012   LDLCALC 59 11/29/2012   ALT 32 11/29/2012   ALT 32 11/29/2012   AST 31 11/29/2012   AST 31 11/29/2012   NA 139 11/29/2012   K 4.8 11/29/2012   CL 105  11/29/2012   CREATININE 1.3 11/29/2012   BUN 18 11/29/2012   CO2 28 11/29/2012   TSH 2.67 11/04/2008   PSA 0.47 11/13/2009   INR 1.1 RATIO* 05/16/2008   HGBA1C 6.2 11/13/2009         Assessment & Plan:

## 2013-11-22 NOTE — Assessment & Plan Note (Signed)
Chronic Not on CPAP See Dr Gwenette Greet

## 2013-11-22 NOTE — Assessment & Plan Note (Signed)
Continue with current prescription therapy as reflected on the Med list.  

## 2013-11-22 NOTE — Assessment & Plan Note (Signed)
Colonoscopy - need last report from Dr. Earlean Shawl (May '13) Immunizations: Pneumonia vaccine - May 9, '13; shingles - had disease, not candidate for vaccine; due Tetanus booster  Here for medicare wellness/physical  Diet: heart healthy  Physical activity: not sedentary  Depression/mood screen: negative  Hearing: intact to whispered voice  Visual acuity: grossly normal, performs annual eye exam  ADLs: capable  Fall risk: none  Home safety: good  Cognitive evaluation: intact to orientation, naming, recall and repetition  EOL planning: adv directives, full code/ I agree  I have personally reviewed and have noted  1. The patient's medical and social history  2. Their use of alcohol, tobacco or illicit drugs  3. Their current medications and supplements  4. The patient's functional ability including ADL's, fall risks, home safety risks and hearing or visual impairment.  5. Diet and physical activities  6. Evidence for depression or mood disorders    Today patient counseled on age appropriate routine health concerns for screening and prevention, each reviewed and up to date or declined. Immunizations reviewed and up to date or declined. Labs ordered and reviewed. Risk factors for depression reviewed and negative. Hearing function and visual acuity are intact. ADLs screened and addressed as needed. Functional ability and level of safety reviewed and appropriate. Education, counseling and referrals performed based on assessed risks today. Patient provided with a copy of personalized plan for preventive services.

## 2013-11-22 NOTE — Telephone Encounter (Signed)
Pt was asking about pulmonary referral for Sleep Apnea to Dr. Gwenette Greet on his way out of the office today. Please advise.

## 2013-11-22 NOTE — Assessment & Plan Note (Signed)
Loose wt Labs 

## 2013-11-22 NOTE — Progress Notes (Signed)
Pre visit review using our clinic review tool, if applicable. No additional management support is needed unless otherwise documented below in the visit note. 

## 2013-11-22 NOTE — Assessment & Plan Note (Signed)
Continue with current prn prescription therapy as reflected on the Med list.  

## 2013-11-22 NOTE — Assessment & Plan Note (Signed)
Premature: MI at 64 and bypass at 68 yo Continue with current prescription therapy as reflected on the Med list.

## 2013-11-23 NOTE — Telephone Encounter (Signed)
Done. Thx.

## 2013-12-16 ENCOUNTER — Encounter: Payer: Self-pay | Admitting: Internal Medicine

## 2013-12-17 ENCOUNTER — Encounter: Payer: Self-pay | Admitting: Family

## 2013-12-17 ENCOUNTER — Telehealth: Payer: Self-pay | Admitting: Internal Medicine

## 2013-12-17 ENCOUNTER — Ambulatory Visit (INDEPENDENT_AMBULATORY_CARE_PROVIDER_SITE_OTHER): Payer: Medicare Other | Admitting: Family

## 2013-12-17 VITALS — BP 110/70 | HR 87 | Temp 98.4°F | Ht 67.0 in | Wt 190.0 lb

## 2013-12-17 DIAGNOSIS — Z23 Encounter for immunization: Secondary | ICD-10-CM | POA: Diagnosis not present

## 2013-12-17 DIAGNOSIS — S81009A Unspecified open wound, unspecified knee, initial encounter: Secondary | ICD-10-CM

## 2013-12-17 DIAGNOSIS — S91009A Unspecified open wound, unspecified ankle, initial encounter: Secondary | ICD-10-CM | POA: Diagnosis not present

## 2013-12-17 DIAGNOSIS — I2510 Atherosclerotic heart disease of native coronary artery without angina pectoris: Secondary | ICD-10-CM | POA: Diagnosis not present

## 2013-12-17 DIAGNOSIS — W19XXXA Unspecified fall, initial encounter: Secondary | ICD-10-CM | POA: Diagnosis not present

## 2013-12-17 DIAGNOSIS — S81809A Unspecified open wound, unspecified lower leg, initial encounter: Secondary | ICD-10-CM

## 2013-12-17 DIAGNOSIS — S81811A Laceration without foreign body, right lower leg, initial encounter: Secondary | ICD-10-CM

## 2013-12-17 NOTE — Progress Notes (Signed)
Pre visit review using our clinic review tool, if applicable. No additional management support is needed unless otherwise documented below in the visit note. 

## 2013-12-17 NOTE — Telephone Encounter (Signed)
Patient Information:  Caller Name: Joshua Brown  Phone: [phone]  Patient: Joshua Brown, Joshua Brown  Gender: Male  DOB: 1945/07/16  Age: 68 Years  PCP: Plotnikov, Alex (Adults only)  Office Follow Up:  Does the office need to follow up with this patient?: No  Instructions For The Office: N/A  RN Note:  Was unable to cleanse wound for about 1 hour.  Wound is healing without signs of infection.  Declined triage for neck symptoms.  Needs Tetanus booster; last one was 05/05/06 per EMR.  No appointments remain at [facility].  Scheduled for 1030 12/17/13 with Cher Nakai, NP at [facility] office.  Symptoms  Reason For Call & Symptoms: Stepped thru rotten porch boards scrapping shin with minor bleeding  12/12/13 or 12/13/13.  Asking for confirmation of date of last Tetanus. Also noted posterior neck pain since 12/13/13 possibly related to computer time.  Reviewed Health History In EMR: Yes  Reviewed Medications In EMR: Yes  Reviewed Allergies In EMR: Yes  Reviewed Surgeries / Procedures: Yes  Date of Onset of Symptoms: 12/12/2013  Treatments Tried: washed wound with soap and water, applied H2O2,  ROM exercises to neck  Treatments Tried Worked: Yes  Guideline(s) Used:  Skin Injury  Disposition Per Guideline:   See Today or Tomorrow in Office  Reason For Disposition Reached:   No tetanus booster in > 5 years (Or greater than 10 years for clean wounds)  Advice Given:  Bleeding  : Apply direct pressure for 10 minutes with a sterile gauze to stop any bleeding.  Cleaning the Wound:  Wash the wound with soap and water for 5 minutes.  For any dirt, scrub gently with a wash cloth.  For any bleeding, apply direct pressure with a sterile gauze or clean cloth for 10 minutes.  Antibiotic Ointment  Apply an Antibiotic Ointment (e.g., OTC Bacitracin), covered by a Band-Aid or dressing. Change daily or if it becomes wet.  Call Back If:  Looks infected (pus, redness, increasing tenderness)  Doesn't heal within 10  days  You become worse.  Dirty Wounds - Tetanus Booster Needed Every 5 Years  Individuals with dirty wounds need a booster every 5 years.  Examples of dirty wounds include any cut contaminated with soil, feces, saliva and more serious wounds from deep punctures, crushing, and burns.  You should try to get the tetanus booster as soon as possible. Make certain you get the booster within three days of the injury.  Patient Will Follow Care Advice:  YES  Appointment Scheduled:  12/17/2013 10:15:00 Appointment Scheduled Provider:  Other

## 2013-12-17 NOTE — Patient Instructions (Signed)
Wound Infection  A wound infection happens when a type of germ (bacteria) starts growing in the wound. In some cases, this can cause the wound to break open. If cared for properly, the infected wound will heal from the inside to the outside. Wound infections need treatment.  CAUSES  An infection is caused by bacteria growing in the wound.   SYMPTOMS    Increase in redness, swelling, or pain at the wound site.   Increase in drainage at the wound site.   Wound or bandage (dressing) starts to smell bad.   Fever.   Feeling tired or fatigued.   Pus draining from the wound.  TREATMENT   You caregiver will prescribe antibiotic medicine. The wound infection should improve within 24 to 48 hours. Any redness around the wound should stop spreading and the wound should be less painful.   HOME CARE INSTRUCTIONS    Only take over-the-counter or prescription medicines for pain, discomfort, or fever as directed by your caregiver.   Take your antibiotics as directed. Finish them even if you start to feel better.   Gently wash the area with mild soap and water 2 times a day, or as directed. Rinse off the soap. Pat the area dry with a clean towel. Do not rub the wound. This may cause bleeding.   Follow your caregiver's instructions for how often you need to change the dressing.   Apply ointment and a dressing to the wound as directed.   If the dressing sticks, moisten it with soapy water and gently remove it.   Change the bandage right away if it becomes wet, dirty, or develops a bad smell.   Take showers. Do not take tub baths, swim, or do anything that may soak the wound until it is healed.   Avoid exercises that make you sweat heavily.   Use anti-itch medicine as directed by your caregiver. The wound may itch when it is healing. Do not pick or scratch at the wound.   Follow up with your caregiver to get your wound rechecked as directed.  SEEK MEDICAL CARE IF:   You have an increase in swelling, pain, or redness  around the wound.   You have an increase in the amount of pus coming from the wound.   There is a bad smell coming from the wound.   More of the wound breaks open.   You have a fever.  MAKE SURE YOU:    Understand these instructions.   Will watch your condition.   Will get help right away if you are not doing well or get worse.  Document Released: 03/20/2003 Document Revised: 09/13/2011 Document Reviewed: 10/25/2010  Exit[employer] Patient Information 2014 [employer].

## 2013-12-17 NOTE — Progress Notes (Signed)
Subjective:    Patient ID: Joshua Brown, male    DOB: 12/12/1945, 68 y.o.   MRN: [id number]  Laceration     68 year old nonsmoking male presents for acute visit regarding laceration on R leg after falling into a rotten wood plank. The fall was a misstep only, denies cardiac or neural prodrome.  He cleaned the affected area with H2O2, then used a straight razor blade to open the area where he believed there was infection. He then cleaned the area with H202 again. Believes area is scabbing over nicely, although there is one area weeping serosanguinous fluid at present.   He is concerned over soreness in his neck, noted the following day. He describes sitting at a computer for hours each day and is unsure if the neck soreness is related to the laceration or his computer usage.   He specifically denies fever, chills, sweats, abdominal rigidity, muscle spasms or twitching.    Review of Systems  Constitutional: Negative.   HENT: Negative.   Eyes: Negative.   Respiratory: Negative.  Negative for wheezing.        Airway CTA with hyperinflation  Cardiovascular: Negative.   Gastrointestinal: Negative.   Endocrine: Negative.   Musculoskeletal: Positive for neck pain and neck stiffness.  Skin: Positive for wound.       R shin   Allergic/Immunologic: Negative.   Neurological: Negative.   Hematological: Negative.   Psychiatric/Behavioral: Negative.    Past Medical History  Diagnosis Date  . Myocardial infarct, old   . Hypertension   . ED (erectile dysfunction)   . Hyperglycemia     Borderline  . Obesity     Class I  . PVD (peripheral vascular disease)     ABI June '12 -0.9 bilateral.  . Frequent urination at night     nocturia 2-3  . GERD (gastroesophageal reflux disease)   . Hyperlipidemia     Atherogenic  . CAD (coronary artery disease)     premature  . DDD (degenerative disc disease)     lumbar spine  . ABDOMINAL AORTIC ANEURYSM 11/12/2008    has annual U/s    History    Social History  . Marital Status: Married    Spouse Name: N/A    Number of Children: 3  . Years of Education: 14   Occupational History  . 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 Smoking status: Former Smoker -- 1.00 packs/day for 16 years    Types: Cigarettes    Quit date: 07/05/1969  . Smokeless tobacco: Never Used  . Alcohol Use: 1.0 oz/week    2 drink(s) per week  . Drug Use: No  . Sexual Activity: Yes    Partners: Female   Other Topics Concern  . Not on file   Social History Narrative   HSG, [employer] 1 year. Married 1977. 1 son - 23; 2 daughters - 69, 86; 3  Grandchildren. 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 Marriage in good health. ACP - discussed. Provided packet May '13.    Past Surgical History  Procedure Laterality Date  . Coronary artery bypass graft    . Tonsillectomy      age 71  . Colonoscopy      one month ago    Family History  Problem Relation Age of Onset  . Cancer Mother     Jaw Cancer  . Hyperlipidemia Mother   .  Heart disease Mother     CHF  . Early death Father   . Heart disease Father     CAD/MI  . Cancer Brother     prostate  . Heart disease Paternal Grandfather     CAD/MI    No Known Allergies  Current Outpatient Prescriptions on File Prior to Visit  Medication Sig Dispense Refill  . aspirin 81 MG EC tablet Take 81 mg by mouth daily.        Marland Kitchen atorvastatin (LIPITOR) 80 MG tablet TAKE ONE TABLET BY MOUTH EVERY DAY  90 tablet  3  . b complex vitamins capsule Take 1 capsule by mouth daily.        . Cholecalciferol (VITAMIN D) 2000 UNIT CAPS Take 2,000 capsules by mouth daily.        . clopidogrel (PLAVIX) 75 MG tablet TAKE ONE TABLET BY MOUTH EVERY DAY  90 tablet  3  . ezetimibe (ZETIA) 10 MG tablet TAKE ONE TABLET BY MOUTH EVERY DAY  90 tablet  3  . losartan (COZAAR) 25 MG tablet Take 1 tablet (25 mg total) by mouth daily.  90 tablet  3  . metoprolol succinate (TOPROL-XL) 50 MG  24 hr tablet Take 1 tablet (50 mg total) by mouth daily. Take with or immediately following a meal.  90 tablet  3  . niacin (NIASPAN) 500 MG CR tablet Take 2,000 mg by mouth at bedtime.        . nitroGLYCERIN (NITROSTAT) 0.4 MG SL tablet Place 1 tablet (0.4 mg total) under the tongue every 5 (five) minutes as needed for chest pain.  20 tablet  5  . pantoprazole (PROTONIX) 40 MG tablet TAKE ONE TABLET BY MOUTH EVERY DAY  90 tablet  3   No current facility-administered medications on file prior to visit.    BP 110/70  Pulse 87  Temp(Src) 98.4 F (36.9 C) (Oral)  Ht 5\' 7"  (1.702 m)  Wt 190 lb (86.183 kg)  BMI 29.75 kg/m2    Objective:   Physical Exam  Constitutional: He is oriented to person, place, and time. He appears well-developed.  HENT:  Head: Normocephalic and atraumatic.  Neck: Normal range of motion. Neck supple.  Cardiovascular: Normal rate, regular rhythm and normal heart sounds.   Pulmonary/Chest: Effort normal and breath sounds normal. No stridor. No respiratory distress. He has no wheezes. He has no rales.  Abdominal: Soft. He exhibits no distension. There is no guarding.  Musculoskeletal: Normal range of motion.  Lymphadenopathy:    He has no cervical adenopathy.  Neurological: He is alert and oriented to person, place, and time. He has normal reflexes.  Skin: He is not diaphoretic.  Dark scabbing on R shin, approximately 2.5 x 3.0 cm, erythema around scabbing. Pin-sized drop of serosanguinous oozing noted on wound.   Psychiatric: He has a normal mood and affect. His behavior is normal. Judgment and thought content normal.      Assessment & Plan:  Joshua Brown was seen today for laceration.  Diagnoses and associated orders for this visit:  Need for prophylactic vaccination with tetanus toxoid alone - Td vaccine preservative free greater than or equal to 7yo IM  Laceration of skin of right lower leg   Triple ABX samples given. Advised to avoid H2O2 and never use  razor blade for wound care.

## 2014-01-01 ENCOUNTER — Ambulatory Visit (INDEPENDENT_AMBULATORY_CARE_PROVIDER_SITE_OTHER): Payer: Medicare Other | Admitting: Cardiovascular Disease

## 2014-01-01 ENCOUNTER — Encounter: Payer: Self-pay | Admitting: Cardiovascular Disease

## 2014-01-01 VITALS — BP 133/71 | HR 67 | Ht 67.0 in | Wt 189.1 lb

## 2014-01-01 DIAGNOSIS — I252 Old myocardial infarction: Secondary | ICD-10-CM

## 2014-01-01 DIAGNOSIS — I70219 Atherosclerosis of native arteries of extremities with intermittent claudication, unspecified extremity: Secondary | ICD-10-CM

## 2014-01-01 DIAGNOSIS — I714 Abdominal aortic aneurysm, without rupture, unspecified: Secondary | ICD-10-CM | POA: Diagnosis not present

## 2014-01-01 DIAGNOSIS — E785 Hyperlipidemia, unspecified: Secondary | ICD-10-CM | POA: Diagnosis not present

## 2014-01-01 NOTE — Progress Notes (Signed)
HPI:  68 year old gentleman presenting for followup evaluation. The patient has a history of premature coronary artery disease with remote myocardial infarction. He underwent CABG many years ago. He's also followed for peripheral arterial disease with distal abdominal aortic stenosis. His ABIs have been normal. He's had minor nonobstructive carotid stenosis. His last nuclear stress test was in 2013 when he had complained of chest discomfort. This showed a small, mild basal inferior perfusion defect with reversibility, described as a "low risk study." His LVEF was 57%. Most recent lipids reviewed as below:  Lipid Panel     Component Value Date/Time   CHOL 142 11/22/2013 1106   TRIG 76.0 11/22/2013 1106   HDL 44.80 11/22/2013 1106   CHOLHDL 3 11/22/2013 1106   VLDL 15.2 11/22/2013 1106   LDLCALC 82 11/22/2013 1106   Last ABIs one year ago were 1.0 bilaterally. Abdominal aortic duplex imaging demonstrated stable, moderate distal abdominal aortic stenosis.  The patient has occasional chest discomfort. There is no change in pattern. He has not taken any nitroglycerin since his last visit one year ago. He does not have any exertional chest discomfort on a regular basis. He denies dyspnea, heart palpitations, edema, orthopnea, or PND. He does complain of buttock claudication with walking as well as erectile dysfunction. He can walk about one half of a mile. He denies pain in his thighs or calves with walking.  Outpatient Encounter Prescriptions as of 01/01/2014  Medication Sig  . aspirin 81 MG EC tablet Take 81 mg by mouth daily.    Marland Kitchen atorvastatin (LIPITOR) 80 MG tablet TAKE ONE TABLET BY MOUTH EVERY DAY  . b complex vitamins capsule Take 1 capsule by mouth daily.    . Cholecalciferol (VITAMIN D) 2000 UNIT CAPS Take 2,000 capsules by mouth daily.    . clopidogrel (PLAVIX) 75 MG tablet TAKE ONE TABLET BY MOUTH EVERY DAY  . ezetimibe (ZETIA) 10 MG tablet TAKE ONE TABLET BY MOUTH EVERY DAY  . losartan  (COZAAR) 25 MG tablet Take 1 tablet (25 mg total) by mouth daily.  . metoprolol succinate (TOPROL-XL) 50 MG 24 hr tablet Take 1 tablet (50 mg total) by mouth daily. Take with or immediately following a meal.  . niacin (NIASPAN) 500 MG CR tablet Take 2,000 mg by mouth at bedtime.    . nitroGLYCERIN (NITROSTAT) 0.4 MG SL tablet Place 1 tablet (0.4 mg total) under the tongue every 5 (five) minutes as needed for chest pain.  . pantoprazole (PROTONIX) 40 MG tablet TAKE ONE TABLET BY MOUTH EVERY DAY    No Known Allergies  Past Medical History  Diagnosis Date  . Myocardial infarct, old   . Hypertension   . ED (erectile dysfunction)   . Hyperglycemia     Borderline  . Obesity     Class I  . PVD (peripheral vascular disease)     ABI June '12 -0.9 bilateral.  . Frequent urination at night     nocturia 2-3  . GERD (gastroesophageal reflux disease)   . Hyperlipidemia     Atherogenic  . CAD (coronary artery disease)     premature  . DDD (degenerative disc disease)     lumbar spine  . ABDOMINAL AORTIC ANEURYSM 11/12/2008    has annual U/s    ROS: Negative except as per HPI  BP 133/71  Pulse 67  Ht 5\' 7"  (1.702 m)  Wt 85.784 kg (189 lb 1.9 oz)  BMI 29.61 kg/m2  PHYSICAL EXAM: Pt is alert  and oriented, NAD HEENT: normal Neck: JVP - normal, carotids 2+= without bruits Lungs: CTA bilaterally CV: RRR without murmur or gallop Abd: soft, NT, Positive BS, no hepatomegaly Ext: no C/C/E, distal pulses intact and equal Skin: warm/dry no rash  EKG:  Normal sinus rhythm with occasional PVCs. T wave abnormality consider inferior ischemia.  ASSESSMENT AND PLAN: 1. Coronary artery disease. The patient had his first MI at age 85. He had CABG at age 71. He has done remarkably well without recurrent ischemia. He notes occasional chest discomfort but no consistent exertional angina. There is no change in the pattern of his chest pain. Will continue with his current medical management. We had  extensive discussion today about the need for regular exercise and focus on weight loss. I will see him back in one year. I stressed the importance of early recognition if chest pain occurs.  2. Hyperlipidemia. The patient is on aggressive program with atorvastatin, zetia, and niacin. He will continue his same medications. Most recent lipids were reviewed.  3. Peripheral arterial disease. Will repeat an abdominal aortic ultrasound and ABIs. Suspect symptoms of buttock claudication and erectile dysfunction may be related to his PAD.  4. Hypertension. Blood pressure is controlled on combination of losartan and metoprolol succinate.  Sherren Mocha 01/01/2014 9:07 AM

## 2014-01-01 NOTE — Patient Instructions (Signed)
Your physician has requested that you have an abdominal aorta duplex. During this test, an ultrasound is used to evaluate the aorta. Allow 30 minutes for this exam. Do not eat after midnight the day before and avoid carbonated beverages  Your physician has requested that you have an ankle brachial index (ABI). During this test an ultrasound and blood pressure cuff are used to evaluate the arteries that supply the arms and legs with blood. Allow thirty minutes for this exam. There are no restrictions or special instructions.  Your physician recommends that you continue on your current medications as directed. Please refer to the Current Medication list given to you today.  Your physician wants you to follow-up in: 1 YEAR with Dr Burt Knack.  You will receive a reminder letter in the mail two months in advance. If you don't receive a letter, please call our office to schedule the follow-up appointment.

## 2014-01-08 ENCOUNTER — Telehealth: Payer: Self-pay | Admitting: *Deleted

## 2014-01-08 ENCOUNTER — Ambulatory Visit (INDEPENDENT_AMBULATORY_CARE_PROVIDER_SITE_OTHER): Payer: Medicare Other | Admitting: Pulmonary Disease

## 2014-01-08 ENCOUNTER — Encounter: Payer: Self-pay | Admitting: Pulmonary Disease

## 2014-01-08 VITALS — BP 112/62 | HR 62 | Temp 97.8°F | Ht 67.0 in | Wt 190.2 lb

## 2014-01-08 DIAGNOSIS — I70219 Atherosclerosis of native arteries of extremities with intermittent claudication, unspecified extremity: Secondary | ICD-10-CM | POA: Diagnosis not present

## 2014-01-08 DIAGNOSIS — G4733 Obstructive sleep apnea (adult) (pediatric): Secondary | ICD-10-CM

## 2014-01-08 NOTE — Telephone Encounter (Signed)
Pt is wanting to know if he can have 1/2 xanax on the night of his sleep study? Thanks

## 2014-01-08 NOTE — Progress Notes (Signed)
Subjective:    Patient ID: Joshua Brown, male    DOB: May 15, 1946, 68 y.o.   MRN: [id number]  HPI  68 year old man presents for evaluation of sleep disordered breathing. He reports loud snoring and excessive nocturia. He does report occasional gasping and choking episodes that wake him up from sleep.  2011- PSG-191 lbs- AHI 17/h -predom with supine REM  PMH- Premature CAD, hyperlipidemia, Barrett's esophagus, CABG 1989 Epworth sleepiness score is 18 Bedtime is around 11 PM but he is often napping around 8 PM while watching TV. Sleep latency is minimal, he sleeps on his side with 2 pillows, he reports 4-5 nocturnal awakenings including bathroom visits without any post void sleep latency and is out of bed by 7 AM feeling tired without dryness of mouth or headaches. His weight is essentially unchanged since the sleep study. There is no history suggestive of cataplexy, sleep paralysis or parasomnias   Past Medical History  Diagnosis Date  . Myocardial infarct, old   . Hypertension   . ED (erectile dysfunction)   . Hyperglycemia     Borderline  . Obesity     Class I  . PVD (peripheral vascular disease)     ABI June '12 -0.9 bilateral.  . Frequent urination at night     nocturia 2-3  . GERD (gastroesophageal reflux disease)   . Hyperlipidemia     Atherogenic  . CAD (coronary artery disease)     premature  . DDD (degenerative disc disease)     lumbar spine  . ABDOMINAL AORTIC ANEURYSM 11/12/2008    has annual U/s    Past Surgical History  Procedure Laterality Date  . Coronary artery bypass graft    . Tonsillectomy      age 52  . Colonoscopy      one month ago    No Known Allergies  History   Social History  . Marital Status: Married    Spouse Name: N/A    Number of Children: 3  . Years of Education: 28   Occupational History  . [employer]     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 History Main Topics  . Smoking status: Former Smoker -- 1.00 packs/day  for 16 years    Types: Cigarettes    Quit date: 07/05/1969  . Smokeless tobacco: Never Used  . Alcohol Use: 1.0 oz/week    2 drink(s) per week     Comment: socially  . Drug Use: No  . Sexual Activity: Yes    Partners: Female   Other Topics Concern  . Not on file   Social History Narrative   HSG, [employer] 1 year. Married 1977. 1 son - 60; 2 daughters - 53, 68; 3  Grandchildren. 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 Marriage in good health. ACP - discussed. Provided packet May '13.    Family History  Problem Relation Age of Onset  . Cancer Mother     Jaw Cancer  . Hyperlipidemia Mother   . Heart disease Mother     CHF  . Early death Father   . Heart disease Father     CAD/MI  . Prostate cancer Brother     prostate  . Heart disease Paternal Grandfather     CAD/MI     Review of Systems  Constitutional: Negative for fever and unexpected weight change.  HENT: Negative for congestion, dental problem, ear pain, nosebleeds, postnasal drip, rhinorrhea, sinus pressure, sneezing, sore throat and  trouble swallowing.   Eyes: Negative for redness and itching.  Respiratory: Negative for cough, chest tightness, shortness of breath and wheezing.   Cardiovascular: Negative for palpitations and leg swelling.  Gastrointestinal: Negative for nausea and vomiting.  Genitourinary: Negative for dysuria.  Musculoskeletal: Negative for joint swelling.  Skin: Negative for rash.  Neurological: Negative for headaches.  Hematological: Does not bruise/bleed easily.  Psychiatric/Behavioral: Negative for dysphoric mood. The patient is nervous/anxious.        Objective:   Physical Exam  Gen. Pleasant, obese, in no distress, normal affect ENT - no lesions, no post nasal drip, class 2 airway Neck: No JVD, no thyromegaly, no carotid bruits Lungs: no use of accessory muscles, no dullness to percussion, decreased without rales or rhonchi  Cardiovascular: Rhythm regular, heart sounds  normal,  no murmurs or gallops, no peripheral edema Abdomen: soft and non-tender, no hepatosplenomegaly, BS normal. Musculoskeletal: No deformities, no cyanosis or clubbing Neuro:  alert, non focal, no tremors       Assessment & Plan:

## 2014-01-08 NOTE — Patient Instructions (Signed)
CPAP titration study We will set you up with a CPAP trial after

## 2014-01-08 NOTE — Assessment & Plan Note (Addendum)
Given excessive daytime somnolence, narrow pharyngeal exam, witnessed apneas & loud snoring, obstructive sleep apnea is very likely & an overnight polysomnogram will be scheduled as a Titration study. The pathophysiology of obstructive sleep apnea , it's cardiovascular consequences & modes of treatment including CPAP were discused with the patient in detail & they evidenced understanding. Given his cardiac profile, best to proceed with trial of cpap after titration study. If does not tolerate, can try positional therapy alone

## 2014-01-08 NOTE — Telephone Encounter (Signed)
Per Dr. Elsworth Soho this was fine  Pt aware

## 2014-01-21 ENCOUNTER — Ambulatory Visit (HOSPITAL_COMMUNITY): Payer: Medicare Other | Attending: Cardiovascular Disease | Admitting: Cardiology

## 2014-01-21 ENCOUNTER — Ambulatory Visit (INDEPENDENT_AMBULATORY_CARE_PROVIDER_SITE_OTHER): Payer: Medicare Other | Admitting: Cardiology

## 2014-01-21 DIAGNOSIS — I10 Essential (primary) hypertension: Secondary | ICD-10-CM | POA: Diagnosis not present

## 2014-01-21 DIAGNOSIS — I2510 Atherosclerotic heart disease of native coronary artery without angina pectoris: Secondary | ICD-10-CM | POA: Insufficient documentation

## 2014-01-21 DIAGNOSIS — Z87891 Personal history of nicotine dependence: Secondary | ICD-10-CM | POA: Diagnosis not present

## 2014-01-21 DIAGNOSIS — E785 Hyperlipidemia, unspecified: Secondary | ICD-10-CM | POA: Diagnosis not present

## 2014-01-21 DIAGNOSIS — I700 Atherosclerosis of aorta: Secondary | ICD-10-CM | POA: Insufficient documentation

## 2014-01-21 DIAGNOSIS — I714 Abdominal aortic aneurysm, without rupture, unspecified: Secondary | ICD-10-CM

## 2014-01-21 DIAGNOSIS — I70219 Atherosclerosis of native arteries of extremities with intermittent claudication, unspecified extremity: Secondary | ICD-10-CM

## 2014-01-21 NOTE — Progress Notes (Signed)
LEA Doppler + ABI's performed

## 2014-01-21 NOTE — Progress Notes (Signed)
Duplex aorta, common and external iliac arteries.

## 2014-02-25 ENCOUNTER — Encounter (HOSPITAL_BASED_OUTPATIENT_CLINIC_OR_DEPARTMENT_OTHER): Payer: Medicare Other

## 2014-02-27 ENCOUNTER — Other Ambulatory Visit: Payer: Self-pay | Admitting: Cardiovascular Disease

## 2014-02-28 ENCOUNTER — Other Ambulatory Visit: Payer: Self-pay | Admitting: *Deleted

## 2014-02-28 MED ORDER — CLOPIDOGREL BISULFATE 75 MG PO TABS: ORAL_TABLET | ORAL | Status: DC

## 2014-03-06 ENCOUNTER — Other Ambulatory Visit: Payer: Self-pay | Admitting: Cardiovascular Disease

## 2014-03-07 ENCOUNTER — Other Ambulatory Visit: Payer: Self-pay | Admitting: *Deleted

## 2014-03-07 MED ORDER — EZETIMIBE 10 MG PO TABS: ORAL_TABLET | ORAL | Status: DC

## 2014-04-20 DIAGNOSIS — M25561 Pain in right knee: Secondary | ICD-10-CM | POA: Diagnosis not present

## 2014-05-06 ENCOUNTER — Other Ambulatory Visit: Payer: Self-pay | Admitting: Cardiovascular Disease

## 2014-06-04 DIAGNOSIS — D239 Other benign neoplasm of skin, unspecified: Secondary | ICD-10-CM | POA: Diagnosis not present

## 2014-06-04 DIAGNOSIS — L821 Other seborrheic keratosis: Secondary | ICD-10-CM | POA: Diagnosis not present

## 2014-07-11 ENCOUNTER — Other Ambulatory Visit: Payer: Self-pay | Admitting: Cardiovascular Disease

## 2014-08-14 DIAGNOSIS — H43812 Vitreous degeneration, left eye: Secondary | ICD-10-CM | POA: Diagnosis not present

## 2014-08-28 DIAGNOSIS — H43812 Vitreous degeneration, left eye: Secondary | ICD-10-CM | POA: Diagnosis not present

## 2014-08-28 DIAGNOSIS — E119 Type 2 diabetes mellitus without complications: Secondary | ICD-10-CM | POA: Diagnosis not present

## 2014-08-28 LAB — HM DIABETES EYE EXAM

## 2014-10-15 ENCOUNTER — Encounter: Payer: Self-pay | Admitting: Internal Medicine

## 2014-11-15 ENCOUNTER — Other Ambulatory Visit: Payer: Self-pay | Admitting: Cardiovascular Disease

## 2014-11-27 ENCOUNTER — Encounter: Payer: Self-pay | Admitting: Internal Medicine

## 2014-11-27 ENCOUNTER — Ambulatory Visit (INDEPENDENT_AMBULATORY_CARE_PROVIDER_SITE_OTHER): Payer: Medicare Other | Admitting: Internal Medicine

## 2014-11-27 ENCOUNTER — Other Ambulatory Visit (INDEPENDENT_AMBULATORY_CARE_PROVIDER_SITE_OTHER): Payer: Medicare Other

## 2014-11-27 VITALS — BP 140/70 | HR 73 | Ht 66.75 in | Wt 185.0 lb

## 2014-11-27 DIAGNOSIS — K2100 Gastro-esophageal reflux disease with esophagitis, without bleeding: Secondary | ICD-10-CM

## 2014-11-27 DIAGNOSIS — R739 Hyperglycemia, unspecified: Secondary | ICD-10-CM | POA: Diagnosis not present

## 2014-11-27 DIAGNOSIS — Z0000 Encounter for general adult medical examination without abnormal findings: Secondary | ICD-10-CM | POA: Diagnosis not present

## 2014-11-27 DIAGNOSIS — N320 Bladder-neck obstruction: Secondary | ICD-10-CM

## 2014-11-27 DIAGNOSIS — E785 Hyperlipidemia, unspecified: Secondary | ICD-10-CM

## 2014-11-27 DIAGNOSIS — I10 Essential (primary) hypertension: Secondary | ICD-10-CM

## 2014-11-27 DIAGNOSIS — I252 Old myocardial infarction: Secondary | ICD-10-CM

## 2014-11-27 DIAGNOSIS — R0683 Snoring: Secondary | ICD-10-CM

## 2014-11-27 DIAGNOSIS — E291 Testicular hypofunction: Secondary | ICD-10-CM

## 2014-11-27 LAB — URINALYSIS
Bilirubin Urine: NEGATIVE
Hgb urine dipstick: NEGATIVE
Ketones, ur: NEGATIVE
Leukocytes, UA: NEGATIVE
Nitrite: NEGATIVE
SPECIFIC GRAVITY, URINE: 1.02 (ref 1.000–1.030)
TOTAL PROTEIN, URINE-UPE24: NEGATIVE
URINE GLUCOSE: NEGATIVE
UROBILINOGEN UA: 0.2 (ref 0.0–1.0)
pH: 6 (ref 5.0–8.0)

## 2014-11-27 LAB — TESTOSTERONE: Testosterone: 199.96 ng/dL — ABNORMAL LOW (ref 300.00–890.00)

## 2014-11-27 LAB — CBC WITH DIFFERENTIAL/PLATELET
Basophils Absolute: 0 10*3/uL (ref 0.0–0.1)
Basophils Relative: 0.4 % (ref 0.0–3.0)
EOS PCT: 2.3 % (ref 0.0–5.0)
Eosinophils Absolute: 0.2 10*3/uL (ref 0.0–0.7)
HCT: 47.4 % (ref 39.0–52.0)
Hemoglobin: 16.1 g/dL (ref 13.0–17.0)
Lymphocytes Relative: 23 % (ref 12.0–46.0)
Lymphs Abs: 1.9 10*3/uL (ref 0.7–4.0)
MCHC: 33.9 g/dL (ref 30.0–36.0)
MCV: 85.3 fl (ref 78.0–100.0)
MONOS PCT: 7.3 % (ref 3.0–12.0)
Monocytes Absolute: 0.6 10*3/uL (ref 0.1–1.0)
Neutro Abs: 5.4 10*3/uL (ref 1.4–7.7)
Neutrophils Relative %: 67 % (ref 43.0–77.0)
Platelets: 153 10*3/uL (ref 150.0–400.0)
RBC: 5.56 Mil/uL (ref 4.22–5.81)
RDW: 15 % (ref 11.5–15.5)
WBC: 8.1 10*3/uL (ref 4.0–10.5)

## 2014-11-27 LAB — BASIC METABOLIC PANEL
BUN: 14 mg/dL (ref 6–23)
CALCIUM: 9.6 mg/dL (ref 8.4–10.5)
CO2: 30 mEq/L (ref 19–32)
Chloride: 105 mEq/L (ref 96–112)
Creatinine, Ser: 1.14 mg/dL (ref 0.40–1.50)
GFR: 67.48 mL/min (ref >60.00)
GLUCOSE: 135 mg/dL — AB (ref 70–99)
POTASSIUM: 5 meq/L (ref 3.5–5.1)
SODIUM: 139 meq/L (ref 135–145)

## 2014-11-27 LAB — LIPID PANEL
Cholesterol: 153 mg/dL (ref 0–200)
HDL: 43.8 mg/dL (ref >39.00)
LDL Cholesterol: 95 mg/dL (ref 0–99)
NONHDL: 109.2
TRIGLYCERIDES: 70 mg/dL (ref 0.0–149.0)
Total CHOL/HDL Ratio: 3
VLDL: 14 mg/dL (ref 0.0–40.0)

## 2014-11-27 LAB — TSH: TSH: 1.64 u[IU]/mL (ref 0.35–4.50)

## 2014-11-27 LAB — PSA: PSA: 0.4 ng/mL (ref 0.10–4.00)

## 2014-11-27 LAB — HEMOGLOBIN A1C: Hgb A1c MFr Bld: 6.5 % (ref 4.6–6.5)

## 2014-11-27 NOTE — Patient Instructions (Signed)
There are natural ways to boost your testosterone:  1. Lose Weight If you're overweight, shedding the excess pounds may increase your testosterone levels, according to multiple research. Overweight men are more likely to have low testosterone levels to begin with, so this is an important trick to increase your body's testosterone production when you need it most.   2. Strength Training    Strength training is also known to boost testosterone levels, provided you are doing so intensely enough. When strength training to boost testosterone, you'll want to increase the weight and lower your number of reps, and then focus on exercises that work a large number of muscles.  3. Optimize Your Vitamin D Levels Vitamin D, a steroid hormone, is essential for the healthy development of the nucleus of the sperm cell, and helps maintain semen quality and sperm count. Vitamin D also increases levels of testosterone, which may boost libido. In one study, overweight men who were given vitamin D supplements had a significant increase in testosterone levels after one year.  4. Reduce Stress When you're under a lot of stress, your body releases high levels of the stress hormone cortisol. This hormone actually blocks the effects of testosterone, presumably because, from a biological standpoint, testosterone-associated behaviors (mating, competing, aggression) may have lowered your chances of survival in an emergency (hence, the "fight or flight" response is dominant, courtesy of cortisol).  5. Limit or Eliminate Sugar from Your Diet Testosterone levels decrease after you eat sugar, which is likely because the sugar leads to a high insulin level, another factor leading to low testosterone.  6. Eat Healthy Fats By healthy, this means not only mon- and polyunsaturated fats, like that found in avocadoes and nuts, but also saturated, as these are essential for building testosterone. Research shows that a diet with less than  40 percent of energy as fat (and that mainly from animal sources, i.e. saturated) lead to a decrease in testosterone levels.  It's important to understand that your body requires saturated fats from animal and vegetable sources (such as meat, dairy, certain oils, and tropical plants like coconut) for optimal functioning, and if you neglect this important food group in favor of sugar, grains and other starchy carbs, your health and weight are almost guaranteed to suffer. Examples of healthy fats you can eat more of to give your testosterone levels a boost include:  Olives and Olive oil  Coconuts and coconut oil Butter made from organic milk  Raw nuts, such as, almonds or pecans Eggs Avocados   Meats Palm oil Unheated organic nut oils   7. "Testosterone boosters" containing Vitamin D-3, Niacin, Vitamin B-6, Vitamin B-12, Magnesium, Zinc, Selenium, D-Aspartic Acid, Fenugreed Seed Extract, Oystershell, [place] Extract, [place] Ginseng may be helpful as well.    Preventive Care for Adults A healthy lifestyle and preventive care can promote health and wellness. Preventive health guidelines for men include the following key practices:  A routine yearly physical is a good way to check with your health care provider about your health and preventative screening. It is a chance to share any concerns and updates on your health and to receive a thorough exam.  Visit your dentist for a routine exam and preventative care every 6 months. Brush your teeth twice a day and floss once a day. Good oral hygiene prevents tooth decay and gum disease.  The frequency of eye exams is based on your age, health, family medical history, use of contact lenses, and other factors. Follow your health  care provider's recommendations for frequency of eye exams.  Eat a healthy diet. Foods such as vegetables, fruits, whole grains, low-fat dairy products, and lean protein foods contain the nutrients you need without too many calories.  Decrease your intake of foods high in solid fats, added sugars, and salt. Eat the right amount of calories for you.Get information about a proper diet from your health care provider, if necessary.  Regular physical exercise is one of the most important things you can do for your health. Most adults should get at least 150 minutes of moderate-intensity exercise (any activity that increases your heart rate and causes you to sweat) each week. In addition, most adults need muscle-strengthening exercises on 2 or more days a week.  Maintain a healthy weight. The body mass index (BMI) is a screening tool to identify possible weight problems. It provides an estimate of body fat based on height and weight. Your health care provider can find your BMI and can help you achieve or maintain a healthy weight.For adults 20 years and older:  A BMI below 18.5 is considered underweight.  A BMI of 18.5 to 24.9 is normal.  A BMI of 25 to 29.9 is considered overweight.  A BMI of 30 and above is considered obese.  Maintain normal blood lipids and cholesterol levels by exercising and minimizing your intake of saturated fat. Eat a balanced diet with plenty of fruit and vegetables. Blood tests for lipids and cholesterol should begin at age 74 and be repeated every 5 years. If your lipid or cholesterol levels are high, you are over 50, or you are at high risk for heart disease, you may need your cholesterol levels checked more frequently.Ongoing high lipid and cholesterol levels should be treated with medicines if diet and exercise are not working.  If you smoke, find out from your health care provider how to quit. If you do not use tobacco, do not start.  Lung cancer screening is recommended for adults aged 33-80 years who are at high risk for developing lung cancer because of a history of smoking. A yearly low-dose CT scan of the lungs is recommended for people who have at least a 30-pack-year history of smoking and are  a current smoker or have quit within the past 15 years. A pack year of smoking is smoking an average of 1 pack of cigarettes a day for 1 year (for example: 1 pack a day for 30 years or 2 packs a day for 15 years). Yearly screening should continue until the smoker has stopped smoking for at least 15 years. Yearly screening should be stopped for people who develop a health problem that would prevent them from having lung cancer treatment.  If you choose to drink alcohol, do not have more than 2 drinks per day. One drink is considered to be 12 ounces (355 mL) of beer, 5 ounces (148 mL) of wine, or 1.5 ounces (44 mL) of liquor.  Avoid use of street drugs. Do not share needles with anyone. Ask for help if you need support or instructions about stopping the use of drugs.  High blood pressure causes heart disease and increases the risk of stroke. Your blood pressure should be checked at least every 1-2 years. Ongoing high blood pressure should be treated with medicines, if weight loss and exercise are not effective.  If you are 60-41 years old, ask your health care provider if you should take aspirin to prevent heart disease.  Diabetes screening involves taking a  blood sample to check your fasting blood sugar level. This should be done once every 3 years, after age 87, if you are within normal weight and without risk factors for diabetes. Testing should be considered at a younger age or be carried out more frequently if you are overweight and have at least 1 risk factor for diabetes.  Colorectal cancer can be detected and often prevented. Most routine colorectal cancer screening begins at the age of 34 and continues through age 37. However, your health care provider may recommend screening at an earlier age if you have risk factors for colon cancer. On a yearly basis, your health care provider may provide home test kits to check for hidden blood in the stool. Use of a small camera at the end of a tube to  directly examine the colon (sigmoidoscopy or colonoscopy) can detect the earliest forms of colorectal cancer. Talk to your health care provider about this at age 49, when routine screening begins. Direct exam of the colon should be repeated every 5-10 years through age 61, unless early forms of precancerous polyps or small growths are found.  People who are at an increased risk for hepatitis B should be screened for this virus. You are considered at high risk for hepatitis B if:  You were born in a country where hepatitis B occurs often. Talk with your health care provider about which countries are considered high risk.  Your parents were born in a high-risk country and you have not received a shot to protect against hepatitis B (hepatitis B vaccine).  You have HIV or AIDS.  You use needles to inject street drugs.  You live with, or have sex with, someone who has hepatitis B.  You are a man who has sex with other men (MSM).  You get hemodialysis treatment.  You take certain medicines for conditions such as cancer, organ transplantation, and autoimmune conditions.  Hepatitis C blood testing is recommended for all people born from [birth year] through 1965 and any individual with known risks for hepatitis C.  Practice safe sex. Use condoms and avoid high-risk sexual practices to reduce the spread of sexually transmitted infections (STIs). STIs include gonorrhea, chlamydia, syphilis, trichomonas, herpes, HPV, and human immunodeficiency virus (HIV). Herpes, HIV, and HPV are viral illnesses that have no cure. They can result in disability, cancer, and death.  If you are at risk of being infected with HIV, it is recommended that you take a prescription medicine daily to prevent HIV infection. This is called preexposure prophylaxis (PrEP). You are considered at risk if:  You are a man who has sex with other men (MSM) and have other risk factors.  You are a heterosexual man, are sexually active, and are  at increased risk for HIV infection.  You take drugs by injection.  You are sexually active with a partner who has HIV.  Talk with your health care provider about whether you are at high risk of being infected with HIV. If you choose to begin PrEP, you should first be tested for HIV. You should then be tested every 3 months for as long as you are taking PrEP.  A one-time screening for abdominal aortic aneurysm (AAA) and surgical repair of large AAAs by ultrasound are recommended for men ages 52 to 67 years who are current or former smokers.  Healthy men should no longer receive prostate-specific antigen (PSA) blood tests as part of routine cancer screening. Talk with your health care provider about prostate cancer  screening.  Testicular cancer screening is not recommended for adult males who have no symptoms. Screening includes self-exam, a health care provider exam, and other screening tests. Consult with your health care provider about any symptoms you have or any concerns you have about testicular cancer.  Use sunscreen. Apply sunscreen liberally and repeatedly throughout the day. You should seek shade when your shadow is shorter than you. Protect yourself by wearing long sleeves, pants, a wide-brimmed hat, and sunglasses year round, whenever you are outdoors.  Once a month, do a whole-body skin exam, using a mirror to look at the skin on your back. Tell your health care provider about new moles, moles that have irregular borders, moles that are larger than a pencil eraser, or moles that have changed in shape or color.  Stay current with required vaccines (immunizations).  Influenza vaccine. All adults should be immunized every year.  Tetanus, diphtheria, and acellular pertussis (Td, Tdap) vaccine. An adult who has not previously received Tdap or who does not know his vaccine status should receive 1 dose of Tdap. This initial dose should be followed by tetanus and diphtheria toxoids (Td)  booster doses every 10 years. Adults with an unknown or incomplete history of completing a 3-dose immunization series with Td-containing vaccines should begin or complete a primary immunization series including a Tdap dose. Adults should receive a Td booster every 10 years.  Varicella vaccine. An adult without evidence of immunity to varicella should receive 2 doses or a second dose if he has previously received 1 dose.  Human papillomavirus (HPV) vaccine. Males aged 9-21 years who have not received the vaccine previously should receive the 3-dose series. Males aged 22-26 years may be immunized. Immunization is recommended through the age of 53 years for any male who has sex with males and did not get any or all doses earlier. Immunization is recommended for any person with an immunocompromised condition through the age of 58 years if he did not get any or all doses earlier. During the 3-dose series, the second dose should be obtained 4-8 weeks after the first dose. The third dose should be obtained 24 weeks after the first dose and 16 weeks after the second dose.  Zoster vaccine. One dose is recommended for adults aged 54 years or older unless certain conditions are present.  Measles, mumps, and rubella (MMR) vaccine. Adults born before 45 generally are considered immune to measles and mumps. Adults born in [birth year] or later should have 1 or more doses of MMR vaccine unless there is a contraindication to the vaccine or there is laboratory evidence of immunity to each of the three diseases. A routine second dose of MMR vaccine should be obtained at least 28 days after the first dose for students attending postsecondary schools, health care workers, or international travelers. People who received inactivated measles vaccine or an unknown type of measles vaccine during 1963-1967 should receive 2 doses of MMR vaccine. People who received inactivated mumps vaccine or an unknown type of mumps vaccine before 1979  and are at high risk for mumps infection should consider immunization with 2 doses of MMR vaccine. Unvaccinated health care workers born before 1 who lack laboratory evidence of measles, mumps, or rubella immunity or laboratory confirmation of disease should consider measles and mumps immunization with 2 doses of MMR vaccine or rubella immunization with 1 dose of MMR vaccine.  Pneumococcal 13-valent conjugate (PCV13) vaccine. When indicated, a person who is uncertain of his immunization history and has  no record of immunization should receive the PCV13 vaccine. An adult aged 60 years or older who has certain medical conditions and has not been previously immunized should receive 1 dose of PCV13 vaccine. This PCV13 should be followed with a dose of pneumococcal polysaccharide (PPSV23) vaccine. The PPSV23 vaccine dose should be obtained at least 8 weeks after the dose of PCV13 vaccine. An adult aged 45 years or older who has certain medical conditions and previously received 1 or more doses of PPSV23 vaccine should receive 1 dose of PCV13. The PCV13 vaccine dose should be obtained 1 or more years after the last PPSV23 vaccine dose.  Pneumococcal polysaccharide (PPSV23) vaccine. When PCV13 is also indicated, PCV13 should be obtained first. All adults aged 36 years and older should be immunized. An adult younger than age 66 years who has certain medical conditions should be immunized. Any person who resides in a nursing home or long-term care facility should be immunized. An adult smoker should be immunized. People with an immunocompromised condition and certain other conditions should receive both PCV13 and PPSV23 vaccines. People with human immunodeficiency virus (HIV) infection should be immunized as soon as possible after diagnosis. Immunization during chemotherapy or radiation therapy should be avoided. Routine use of PPSV23 vaccine is not recommended for American Indians, [place]atives, or people younger  than 65 years unless there are medical conditions that require PPSV23 vaccine. When indicated, people who have unknown immunization and have no record of immunization should receive PPSV23 vaccine. One-time revaccination 5 years after the first dose of PPSV23 is recommended for people aged 19-64 years who have chronic kidney failure, nephrotic syndrome, asplenia, or immunocompromised conditions. People who received 1-2 doses of PPSV23 before age 38 years should receive another dose of PPSV23 vaccine at age 67 years or later if at least 5 years have passed since the previous dose. Doses of PPSV23 are not needed for people immunized with PPSV23 at or after age 21 years.  Meningococcal vaccine. Adults with asplenia or persistent complement component deficiencies should receive 2 doses of quadrivalent meningococcal conjugate (MenACWY-D) vaccine. The doses should be obtained at least 2 months apart. Microbiologists working with certain meningococcal bacteria, [employer] recruits, people at risk during an outbreak, and people who travel to or live in countries with a high rate of meningitis should be immunized. A first-year college student up through age 18 years who is living in a residence hall should receive a dose if he did not receive a dose on or after his 16th birthday. Adults who have certain high-risk conditions should receive one or more doses of vaccine.  Hepatitis A vaccine. Adults who wish to be protected from this disease, have certain high-risk conditions, work with hepatitis A-infected animals, work in hepatitis A research labs, or travel to or work in countries with a high rate of hepatitis A should be immunized. Adults who were previously unvaccinated and who anticipate close contact with an international adoptee during the first 60 days after arrival in the [place] from a country with a high rate of hepatitis A should be immunized.  Hepatitis B vaccine. Adults should be immunized if they wish  to be protected from this disease, have certain high-risk conditions, may be exposed to blood or other infectious body fluids, are household contacts or sex partners of hepatitis B positive people, are clients or workers in certain care facilities, or travel to or work in countries with a high rate of hepatitis B.  Haemophilus influenzae type b (  Hib) vaccine. A previously unvaccinated person with asplenia or sickle cell disease or having a scheduled splenectomy should receive 1 dose of Hib vaccine. Regardless of previous immunization, a recipient of a hematopoietic stem cell transplant should receive a 3-dose series 6-12 months after his successful transplant. Hib vaccine is not recommended for adults with HIV infection. Preventive Service / Frequency Ages 3 to 47  Blood pressure check.** / Every 1 to 2 years.  Lipid and cholesterol check.** / Every 5 years beginning at age 46.  Hepatitis C blood test.** / For any individual with known risks for hepatitis C.  Skin self-exam. / Monthly.  Influenza vaccine. / Every year.  Tetanus, diphtheria, and acellular pertussis (Tdap, Td) vaccine.** / Consult your health care provider. 1 dose of Td every 10 years.  Varicella vaccine.** / Consult your health care provider.  HPV vaccine. / 3 doses over 6 months, if 65 or younger.  Measles, mumps, rubella (MMR) vaccine.** / You need at least 1 dose of MMR if you were born in 1957 or later. You may also need a second dose.  Pneumococcal 13-valent conjugate (PCV13) vaccine.** / Consult your health care provider.  Pneumococcal polysaccharide (PPSV23) vaccine.** / 1 to 2 doses if you smoke cigarettes or if you have certain conditions.  Meningococcal vaccine.** / 1 dose if you are age 91 to 70 years and a Market researcher living in a residence hall, or have one of several medical conditions. You may also need additional booster doses.  Hepatitis A vaccine.** / Consult your health care  provider.  Hepatitis B vaccine.** / Consult your health care provider.  Haemophilus influenzae type b (Hib) vaccine.** / Consult your health care provider. Ages 2 to 5  Blood pressure check.** / Every 1 to 2 years.  Lipid and cholesterol check.** / Every 5 years beginning at age 4.  Lung cancer screening. / Every year if you are aged 97-80 years and have a 30-pack-year history of smoking and currently smoke or have quit within the past 15 years. Yearly screening is stopped once you have quit smoking for at least 15 years or develop a health problem that would prevent you from having lung cancer treatment.  Fecal occult blood test (FOBT) of stool. / Every year beginning at age 71 and continuing until age 75. You may not have to do this test if you get a colonoscopy every 10 years.  Flexible sigmoidoscopy** or colonoscopy.** / Every 5 years for a flexible sigmoidoscopy or every 10 years for a colonoscopy beginning at age 38 and continuing until age 75.  Hepatitis C blood test.** / For all people born from [birth year] through 1965 and any individual with known risks for hepatitis C.  Skin self-exam. / Monthly.  Influenza vaccine. / Every year.  Tetanus, diphtheria, and acellular pertussis (Tdap/Td) vaccine.** / Consult your health care provider. 1 dose of Td every 10 years.  Varicella vaccine.** / Consult your health care provider.  Zoster vaccine.** / 1 dose for adults aged 1 years or older.  Measles, mumps, rubella (MMR) vaccine.** / You need at least 1 dose of MMR if you were born in 1957 or later. You may also need a second dose.  Pneumococcal 13-valent conjugate (PCV13) vaccine.** / Consult your health care provider.  Pneumococcal polysaccharide (PPSV23) vaccine.** / 1 to 2 doses if you smoke cigarettes or if you have certain conditions.  Meningococcal vaccine.** / Consult your health care provider.  Hepatitis A vaccine.** / Consult your health care  provider.  Hepatitis B  vaccine.** / Consult your health care provider.  Haemophilus influenzae type b (Hib) vaccine.** / Consult your health care provider. Ages 87 and over  Blood pressure check.** / Every 1 to 2 years.  Lipid and cholesterol check.**/ Every 5 years beginning at age 3.  Lung cancer screening. / Every year if you are aged 23-80 years and have a 30-pack-year history of smoking and currently smoke or have quit within the past 15 years. Yearly screening is stopped once you have quit smoking for at least 15 years or develop a health problem that would prevent you from having lung cancer treatment.  Fecal occult blood test (FOBT) of stool. / Every year beginning at age 45 and continuing until age 34. You may not have to do this test if you get a colonoscopy every 10 years.  Flexible sigmoidoscopy** or colonoscopy.** / Every 5 years for a flexible sigmoidoscopy or every 10 years for a colonoscopy beginning at age 36 and continuing until age 61.  Hepatitis C blood test.** / For all people born from [birth year] through 1966 and any individual with known risks for hepatitis C.  Abdominal aortic aneurysm (AAA) screening.** / A one-time screening for ages 52 to 64 years who are current or former smokers.  Skin self-exam. / Monthly.  Influenza vaccine. / Every year.  Tetanus, diphtheria, and acellular pertussis (Tdap/Td) vaccine.** / 1 dose of Td every 10 years.  Varicella vaccine.** / Consult your health care provider.  Zoster vaccine.** / 1 dose for adults aged 71 years or older.  Pneumococcal 13-valent conjugate (PCV13) vaccine.** / Consult your health care provider.  Pneumococcal polysaccharide (PPSV23) vaccine.** / 1 dose for all adults aged 39 years and older.  Meningococcal vaccine.** / Consult your health care provider.  Hepatitis A vaccine.** / Consult your health care provider.  Hepatitis B vaccine.** / Consult your health care provider.  Haemophilus influenzae type b (Hib) vaccine.** /  Consult your health care provider. **Family history and personal history of risk and conditions may change your health care provider's recommendations. Document Released: 08/17/2001 Document Revised: 06/26/2013 Document Reviewed: 11/16/2010 [facility] Patient Information 2015 [place], [place]. This information is not intended to replace advice given to you by your health care provider. Make sure you discuss any questions you have with your health care provider.

## 2014-11-27 NOTE — Assessment & Plan Note (Signed)
Lipitor and Zetia

## 2014-11-27 NOTE — Assessment & Plan Note (Signed)
Plavix, ASA (quit)

## 2014-11-27 NOTE — Assessment & Plan Note (Signed)
Obese  

## 2014-11-27 NOTE — Assessment & Plan Note (Signed)
Labs Diet, supplements

## 2014-11-27 NOTE — Assessment & Plan Note (Signed)
Losartan, Toprol

## 2014-11-27 NOTE — Progress Notes (Signed)
Subjective:    HPI   The patient is here for a wellness exam. The patient has been doing well overall without major physical or psychological issues going on lately. The patient needs to address  Chronic CAD, PVD,  hypertension that has been well controlled with medicines; to address chronic  hyperlipidemia controlled with medicines as well. C/o probable OSA  BP Readings from Last 3 Encounters:  11/27/14 140/70  01/08/14 112/62  01/01/14 133/71   Wt Readings from Last 3 Encounters:  11/27/14 185 lb (83.915 kg)  01/08/14 190 lb 3.2 oz (86.274 kg)  01/01/14 189 lb 1.9 oz (85.784 kg)      Review of Systems  Constitutional: Negative for appetite change, fatigue and unexpected weight change.  HENT: Negative for congestion, nosebleeds, sneezing, sore throat and trouble swallowing.   Eyes: Negative for itching and visual disturbance.  Respiratory: Negative for cough.   Cardiovascular: Positive for chest pain. Negative for palpitations and leg swelling.  Gastrointestinal: Negative for nausea, diarrhea, blood in stool and abdominal distention.  Genitourinary: Negative for frequency and hematuria.  Musculoskeletal: Positive for myalgias. Negative for back pain, joint swelling, gait problem and neck pain.  Skin: Negative for rash.  Neurological: Negative for dizziness, tremors, speech difficulty and weakness.  Psychiatric/Behavioral: Negative for sleep disturbance, dysphoric mood and agitation. The patient is not nervous/anxious.        Objective:   Physical Exam  Constitutional: He is oriented to person, place, and time. He appears well-developed and well-nourished. No distress.  HENT:  Head: Normocephalic and atraumatic.  Right Ear: External ear normal.  Left Ear: External ear normal.  Nose: Nose normal.  Mouth/Throat: Oropharynx is clear and moist. No oropharyngeal exudate.  Eyes: Conjunctivae and EOM are normal. Pupils are equal, round, and reactive to light. Right eye  exhibits no discharge. Left eye exhibits no discharge. No scleral icterus.  Neck: Normal range of motion. Neck supple. No JVD present. No tracheal deviation present. No thyromegaly present.  Cardiovascular: Normal rate, regular rhythm, normal heart sounds and intact distal pulses.  Exam reveals no gallop and no friction rub.   No murmur heard. Pulmonary/Chest: Effort normal and breath sounds normal. No stridor. No respiratory distress. He has no wheezes. He has no rales. He exhibits no tenderness.  Abdominal: Soft. Bowel sounds are normal. He exhibits no distension and no mass. There is no tenderness. There is no rebound and no guarding.  Genitourinary: Rectum normal, prostate normal and penis normal. Guaiac negative stool. No penile tenderness.  Musculoskeletal: Normal range of motion. He exhibits no edema or tenderness.  Lymphadenopathy:    He has no cervical adenopathy.  Neurological: He is alert and oriented to person, place, and time. He has normal reflexes. No cranial nerve deficit. He exhibits normal muscle tone. Coordination normal.  Skin: Skin is warm and dry. No rash noted. He is not diaphoretic. No erythema. No pallor.  Psychiatric: He has a normal mood and affect. His behavior is normal. Judgment and thought content normal.    Lab Results  Component Value Date   WBC 7.9 11/22/2013   HGB 15.3 11/22/2013   HCT 45.7 11/22/2013   PLT 157.0 11/22/2013   GLUCOSE 114* 11/22/2013   CHOL 142 11/22/2013   TRIG 76.0 11/22/2013   HDL 44.80 11/22/2013   LDLCALC 82 11/22/2013   ALT 28 11/22/2013   AST 31 11/22/2013   NA 139 11/22/2013   K 4.6 11/22/2013   CL 103 11/22/2013   CREATININE 1.0  11/22/2013   BUN 14 11/22/2013   CO2 29 11/22/2013   TSH 1.34 11/22/2013   PSA 0.43 11/22/2013   INR 1.1 RATIO* 05/16/2008   HGBA1C 6.5 11/22/2013         Assessment & Plan:

## 2014-11-27 NOTE — Assessment & Plan Note (Signed)
A1c

## 2014-11-27 NOTE — Progress Notes (Signed)
Pre visit review using our clinic review tool, if applicable. No additional management support is needed unless otherwise documented below in the visit note. 

## 2014-11-27 NOTE — Assessment & Plan Note (Signed)
Colonoscopy - need last report from Dr. Earlean Shawl (May '13)   Here for medicare wellness/physical  Diet: heart healthy  Physical activity: not sedentary  Depression/mood screen: negative  Hearing: intact to whispered voice  Visual acuity: grossly normal, performs annual eye exam  ADLs: capable  Fall risk: none  Home safety: good  Cognitive evaluation: intact to orientation, naming, recall and repetition  EOL planning: adv directives, full code/ I agree  I have personally reviewed and have noted  1. The patient's medical, surgical and social history  2. Their use of alcohol, tobacco or illicit drugs  3. Their current medications and supplements  4. The patient's functional ability including ADL's, fall risks, home safety risks and hearing or visual impairment.  5. Diet and physical activities  6. Evidence for depression or mood disorders 7. The roster of all physicians providing medical care to patient - is listed in the Snapshot section of the chart and reviewed today.    Today patient counseled on age appropriate routine health concerns for screening and prevention, each reviewed and up to date or declined. Immunizations reviewed and up to date or declined. Labs ordered and reviewed. Risk factors for depression reviewed and negative. Hearing function and visual acuity are intact. ADLs screened and addressed as needed. Functional ability and level of safety reviewed and appropriate. Education, counseling and referrals performed based on assessed risks today. Patient provided with a copy of personalized plan for preventive services.

## 2014-11-27 NOTE — Assessment & Plan Note (Signed)
Dr Earlean Shawl Barrett's Protonix qd

## 2014-12-04 ENCOUNTER — Ambulatory Visit (INDEPENDENT_AMBULATORY_CARE_PROVIDER_SITE_OTHER): Payer: Medicare Other | Admitting: Internal Medicine

## 2014-12-04 ENCOUNTER — Encounter: Payer: Self-pay | Admitting: Internal Medicine

## 2014-12-04 VITALS — BP 126/64 | HR 71 | Temp 98.1°F | Ht 66.75 in | Wt 188.0 lb

## 2014-12-04 DIAGNOSIS — J209 Acute bronchitis, unspecified: Secondary | ICD-10-CM

## 2014-12-04 MED ORDER — PROMETHAZINE-CODEINE 6.25-10 MG/5ML PO SYRP: 5.0000 mL | ORAL_SOLUTION | Freq: Two times a day (BID) | ORAL | Status: DC | PRN

## 2014-12-04 MED ORDER — AMOXICILLIN 500 MG PO CAPS: 1000.0000 mg | ORAL_CAPSULE | Freq: Two times a day (BID) | ORAL | Status: DC

## 2014-12-04 NOTE — Progress Notes (Signed)
Pre visit review using our clinic review tool, if applicable. No additional management support is needed unless otherwise documented below in the visit note. 

## 2014-12-04 NOTE — Progress Notes (Signed)
Subjective:    Patient ID: Joshua Brown, male    DOB: Jan 05, 1946, 69 y.o.   MRN: [id number]  DOS:  12/04/2014 Type of visit - description : acute Interval history: Symptoms started a week ago with cough on and off, small amount of green sputum production. Cough has gotten very intense at times and he has use a leftover codeine syrup. In the last few days he has noted some raspy voice.   Review of Systems Denies fever chills No sinus pain or congestion No difficulty breathing No hemoptysis or chest congestion. No nausea vomiting  Past Medical History  Diagnosis Date  . Myocardial infarct, old   . Hypertension   . ED (erectile dysfunction)   . Hyperglycemia     Borderline  . Obesity     Class I  . PVD (peripheral vascular disease)     ABI June '12 -0.9 bilateral.  . Frequent urination at night     nocturia 2-3  . GERD (gastroesophageal reflux disease)   . Hyperlipidemia     Atherogenic  . CAD (coronary artery disease)     premature  . DDD (degenerative disc disease)     lumbar spine  . ABDOMINAL AORTIC ANEURYSM 11/12/2008    has annual U/s    Past Surgical History  Procedure Laterality Date  . Coronary artery bypass graft    . Tonsillectomy      age 24  . Colonoscopy      one month ago    History   Social History  . Marital Status: Married    Spouse Name: N/A  . Number of Children: 3  . Years of Education: 17   Occupational History  . [employer]     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 History Main Topics  . Smoking status: Former Smoker -- 1.00 packs/day for 16 years    Types: Cigarettes    Quit date: 07/05/1969  . Smokeless tobacco: Never Used  . Alcohol Use: 1.0 oz/week    2 drink(s) per week     Comment: socially  . Drug Use: No  . Sexual Activity:    Partners: Female   Other Topics Concern  . Not on file   Social History Narrative   HSG, [employer] 1 year. Married 1977. 1 son - 48; 2 daughters - 27, 52; 3  Grandchildren.  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 Marriage in good health. ACP - discussed. Provided packet May '13.        Medication List       This list is accurate as of: 12/04/14 11:59 PM.  Always use your most recent med list.               amoxicillin 500 MG capsule  Commonly known as:  AMOXIL  Take 2 capsules (1,000 mg total) by mouth 2 (two) times daily.     aspirin 81 MG EC tablet  Take 81 mg by mouth daily.     atorvastatin 80 MG tablet  Commonly known as:  LIPITOR  Take 1 tablet by mouth  daily     b complex vitamins capsule  Take 1 capsule by mouth daily.     clopidogrel 75 MG tablet  Commonly known as:  PLAVIX  Take 1 tablet by mouth  every day     losartan 25 MG tablet  Commonly known as:  COZAAR  Take 1 tablet by mouth  daily  metoprolol succinate 50 MG 24 hr tablet  Commonly known as:  TOPROL-XL  Take 1 tablet by mouth  daily. Take with or  immediately following a  meal.     niacin 500 MG CR tablet  Commonly known as:  NIASPAN  Take 2,000 mg by mouth at bedtime.     nitroGLYCERIN 0.4 MG SL tablet  Commonly known as:  NITROSTAT  Place 1 tablet (0.4 mg total) under the tongue every 5 (five) minutes as needed for chest pain.     pantoprazole 40 MG tablet  Commonly known as:  PROTONIX  Take 1 tablet by mouth  daily     promethazine-codeine 6.25-10 MG/5ML syrup  Commonly known as:  PHENERGAN with CODEINE  Take 5 mLs by mouth 2 (two) times daily as needed for cough.     Vitamin D 2000 UNITS Caps  Take 2,000 capsules by mouth daily.     ZETIA 10 MG tablet  Generic drug:  ezetimibe  Take 1 tablet by mouth  daily           Objective:   Physical Exam BP 126/64 mmHg  Pulse 71  Temp(Src) 98.1 F (36.7 C) (Oral)  Ht 5' 6.75" (1.695 m)  Wt 188 lb (85.276 kg)  BMI 29.68 kg/m2  SpO2 99% General:   Well developed, well nourished . NAD.  HEENT:  Normocephalic . Face symmetric, atraumatic Left TM obscured by wax, right TM normal. Throat symmetric, no  red. Nose is slightly congested Lungs:  CTA B except for few rhonchi with cough. No wheezing Normal respiratory effort, no intercostal retractions, no accessory muscle use. Heart: RRR,  no murmur.  No pretibial edema bilaterally  Skin: Not pale. Not jaundice Neurologic:  alert & oriented X3.  Speech normal, gait appropriate for age and unassisted Psych--  Cognition and judgment appear intact.  Cooperative with normal attention span and concentration.  Behavior appropriate. No anxious or depressed appearing.       Assessment & Plan:    URI/bronchitis Will treat conservatively, see instructions. If not better will start an abx

## 2014-12-04 NOTE — Patient Instructions (Signed)
Rest, fluids , tylenol   take Mucinex DM twice a day as needed   OTC Nasocort or Flonase : 2 nasal sprays on each side of the nose daily until you feel better If the cough continue, take the syrup with codeine   Take the antibiotic as prescribed  (Amoxicillin) only if not improving in the next 3 or 4 days Call if not gradually better over the next  10 days Call anytime if the symptoms are severe

## 2014-12-26 ENCOUNTER — Encounter: Payer: Self-pay | Admitting: Cardiovascular Disease

## 2014-12-26 ENCOUNTER — Ambulatory Visit (INDEPENDENT_AMBULATORY_CARE_PROVIDER_SITE_OTHER): Payer: Medicare Other | Admitting: Cardiovascular Disease

## 2014-12-26 ENCOUNTER — Ambulatory Visit
Admission: RE | Admit: 2014-12-26 | Discharge: 2014-12-26 | Disposition: A | Discharge status: Home / Self Care | Payer: Medicare Other | Source: Ambulatory Visit | Attending: Cardiovascular Disease | Admitting: Cardiovascular Disease

## 2014-12-26 ENCOUNTER — Telehealth: Payer: Self-pay | Admitting: Internal Medicine

## 2014-12-26 VITALS — BP 115/64 | HR 71 | Ht 66.75 in | Wt 185.8 lb

## 2014-12-26 DIAGNOSIS — E785 Hyperlipidemia, unspecified: Secondary | ICD-10-CM

## 2014-12-26 DIAGNOSIS — R05 Cough: Secondary | ICD-10-CM

## 2014-12-26 DIAGNOSIS — I2583 Coronary atherosclerosis due to lipid rich plaque: Secondary | ICD-10-CM | POA: Diagnosis not present

## 2014-12-26 DIAGNOSIS — I739 Peripheral vascular disease, unspecified: Secondary | ICD-10-CM | POA: Diagnosis not present

## 2014-12-26 DIAGNOSIS — I10 Essential (primary) hypertension: Secondary | ICD-10-CM

## 2014-12-26 DIAGNOSIS — R059 Cough, unspecified: Secondary | ICD-10-CM

## 2014-12-26 DIAGNOSIS — I2510 Atherosclerotic heart disease of native coronary artery without angina pectoris: Secondary | ICD-10-CM

## 2014-12-26 NOTE — Patient Instructions (Signed)
Medication Instructions:  Your physician recommends that you continue on your current medications as directed. Please refer to the Current Medication list given to you today.  Labwork: No new orders.   Testing/Procedures: A chest x-ray takes a picture of the organs and structures inside the chest, including the heart, lungs, and blood vessels. This test can show several things, including, whether the heart is enlarges; whether fluid is building up in the lungs; and whether pacemaker / defibrillator leads are still in place. [facility], 1st floor)  Your physician has requested that you have an abdominal aorta duplex (after 01/24/15). During this test, an ultrasound is used to evaluate the aorta. Allow 30 minutes for this exam. Do not eat after midnight the day before and avoid carbonated beverages  Your physician has requested that you have an ankle brachial index (ABI) (after 01/24/15). During this test an ultrasound and blood pressure cuff are used to evaluate the arteries that supply the arms and legs with blood. Allow thirty minutes for this exam. There are no restrictions or special instructions.  Follow-Up: Your physician wants you to follow-up in: 1 YEAR with Dr Burt Knack.  You will receive a reminder letter in the mail two months in advance. If you don't receive a letter, please call our office to schedule the follow-up appointment.   Any Other Special Instructions Will Be Listed Below (If Applicable).

## 2014-12-26 NOTE — Progress Notes (Signed)
Cardiology Office Note   Date:  12/27/2014   ID:  Joshua Brown, Joshua Brown 06-15-1946, MRN [id number]  PCP:  Walker Kehr, MD  Cardiologist:  Sherren Mocha, MD    Chief Complaint  Patient presents with  . Leg Pain     History of Present Illness: Joshua Brown is a 69 y.o. male who presents for follow-up evaluation.  The patient has a history of premature coronary artery disease with remote myocardial infarction at age 43. He underwent CABG many years ago at age 10. He's also followed for peripheral arterial disease with distal abdominal aortic stenosis. His ABIs have been normal. He's had minor nonobstructive carotid stenosis. His last nuclear stress test was in 2013 when he had complained of chest discomfort. This showed a small, mild basal inferior perfusion defect with reversibility, described as a "low risk study." His LVEF was 57%.   From a cardiac perspective he is doing well. He denies chest pain, shortness of breath, leg swelling, or heart palpitations. He does complain of bilateral leg pain with ambulation. Symptoms haven't changed much in the past year. The pain is in his calf muscles. When he slows down and symptoms improve and then he can walk for a long way without problems. He generally has to slow down or rest within his first one fourth of a mile.  Past Medical History  Diagnosis Date  . Myocardial infarct, old   . Hypertension   . ED (erectile dysfunction)   . Hyperglycemia     Borderline  . Obesity     Class I  . PVD (peripheral vascular disease)     ABI June '12 -0.9 bilateral.  . Frequent urination at night     nocturia 2-3  . GERD (gastroesophageal reflux disease)   . Hyperlipidemia     Atherogenic  . CAD (coronary artery disease)     premature  . DDD (degenerative disc disease)     lumbar spine  . ABDOMINAL AORTIC ANEURYSM 11/12/2008    has annual U/s    Past Surgical History  Procedure Laterality Date  . Coronary artery bypass graft    .  Tonsillectomy      age 70  . Colonoscopy      one month ago    Current Outpatient Prescriptions  Medication Sig Dispense Refill  . atorvastatin (LIPITOR) 80 MG tablet Take 1 tablet by mouth  daily 90 tablet 0  . b complex vitamins capsule Take 1 capsule by mouth daily.      . Cholecalciferol (VITAMIN D) 2000 UNIT CAPS Take 2,000 capsules by mouth daily.      . clopidogrel (PLAVIX) 75 MG tablet Take 1 tablet by mouth  every day 90 tablet 0  . losartan (COZAAR) 25 MG tablet Take 1 tablet by mouth  daily 90 tablet 0  . metoprolol succinate (TOPROL-XL) 50 MG 24 hr tablet Take 1 tablet by mouth  daily. Take with or  immediately following a  meal. 90 tablet 0  . niacin (NIASPAN) 500 MG CR tablet Take 2,000 mg by mouth at bedtime.      . nitroGLYCERIN (NITROSTAT) 0.4 MG SL tablet Place 0.4 mg under the tongue every 5 (five) minutes as needed for chest pain.    . pantoprazole (PROTONIX) 40 MG tablet Take 1 tablet by mouth  daily 90 tablet 0  . promethazine-codeine (PHENERGAN WITH CODEINE) 6.25-10 MG/5ML syrup Take 5 mLs by mouth 2 (two) times daily as needed for cough. 120 mL  0  . ZETIA 10 MG tablet Take 1 tablet by mouth  daily 90 tablet 1  . azithromycin (ZITHROMAX) 250 MG tablet Take 2 tablets the first day and 1 tablet for the next four days. 6 tablet 0   No current facility-administered medications for this visit.    Allergies:   Review of patient's allergies indicates no known allergies.   Social History:  The patient  reports that he quit smoking about 45 years ago. His smoking use included Cigarettes. He has a 16 pack-year smoking history. He has never used smokeless tobacco. He reports that he drinks about 1.0 oz of alcohol per week. He reports that he does not use illicit drugs.   Family History:  The patient's  family history includes Cancer in his mother; Early death in his father; Heart disease in his father, mother, and paternal grandfather; Hyperlipidemia in his mother; Prostate  cancer in his brother.    ROS:  Please see the history of present illness.   Positive for easy bruising. All other systems are reviewed and negative.    PHYSICAL EXAM: VS:  BP 115/64 mmHg  Pulse 71  Ht 5' 6.75" (1.695 m)  Wt 185 lb 12.8 oz (84.278 kg)  BMI 29.33 kg/m2 , BMI Body mass index is 29.33 kg/(m^2). GEN: Well nourished, well developed, in no acute distress HEENT: normal Neck: no JVD, no masses. No carotid bruits Cardiac: RRR without murmur or gallop                Respiratory:  clear to auscultation bilaterally, normal work of breathing GI: soft, nontender, nondistended, + BS MS: no deformity or atrophy Ext: no pretibial edema, pedal pulses 2+= bilaterally Skin: warm and dry, no rash Neuro:  Strength and sensation are intact Psych: euthymic mood, full affect  EKG:  EKG is ordered today. The ekg ordered today shows normal sinus rhythm with PVCs, nonspecific T wave abnormality.  Recent Labs: 11/27/2014: BUN 14; Creatinine, Ser 1.14; Hemoglobin 16.1; Platelets 153.0; Potassium 5.0; Sodium 139; TSH 1.64   Lipid Panel     Component Value Date/Time   CHOL 153 11/27/2014 0913   TRIG 70.0 11/27/2014 0913   HDL 43.80 11/27/2014 0913   CHOLHDL 3 11/27/2014 0913   VLDL 14.0 11/27/2014 0913   LDLCALC 95 11/27/2014 0913      Wt Readings from Last 3 Encounters:  12/26/14 185 lb 12.8 oz (84.278 kg)  12/04/14 188 lb (85.276 kg)  11/27/14 185 lb (83.915 kg)     Cardiac Studies Reviewed: Myoview scan 01/17/2012: Impression Exercise Capacity: Good exercise capacity. BP Response: Hypertensive blood pressure response. Clinical Symptoms: Fatigue, no chest pain.  ECG Impression: No significant ST segment change suggestive of ischemia. PVCs in recovery.  Comparison with Prior Nuclear Study: No images to compare  Overall Impression: Low risk stress nuclear study. Small, mild basal inferior perfusion defect is reversible and may be due to ischemia. I do not have the  images from the 2011 study, but report describes it as showing a small area of inferior basilar ischemia. I doubt that there has been a significant change from the prior study.   LV Ejection Fraction: 57%. LV Wall Motion: NL LV Function; NL Wall Motion  ASSESSMENT AND PLAN: 1.  CAD, native vessel, with angina: The patient is stable from a cardiac perspective. He will be continued on his current medical therapy. He was encouraged to participating in regular exercise and work on dietary modification. He discontinued aspirin because of  easy bruising. He continues on clopidogrel.  2. PAD with intermittent claudication, distal aortic stenosis: The patient has distal aortic and bilateral iliac stenosis. He has had moderate but not severe disease based on vascular ultrasound criteria. Will update his duplex scan next month for 1 year follow-up. He was encouraged to continue with regular walking. If there are any significant changes with progression of his aortoiliac disease, will refer to vascular.  3. HTN, essential: Blood pressure well controlled on current therapy.  4. Hyperlipidemia: Lipids as outlined, maintained on high-dose atorvastatin and Zetia   Current medicines are reviewed with the patient today.  The patient does not have concerns regarding medicines.  Labs/ tests ordered today include:   Orders Placed This Encounter  Procedures  . DG Chest 2 View  . EKG 12-Lead   Disposition:   FU one year  Signed, Sherren Mocha, MD  12/27/2014 6:17 PM    [address], [place], [place]  [address] Phone: [phone]; Fax: [phone]

## 2014-12-26 NOTE — Telephone Encounter (Signed)
Caller name: Wagner Relation to pt: self Call back number: [phone] Pharmacy: [facility] [employer] [facility]  Reason for call:   Patient states that he had a chest xray done this morning by Dr. Burt Knack that did show mild bronchitis and is wanting to know if a steroid could be called in? Saw Dr. Larose Kells about two weeks for this. Still has a cough.

## 2014-12-26 NOTE — Telephone Encounter (Signed)
Please advise 

## 2014-12-27 MED ORDER — AZITHROMYCIN 250 MG PO TABS: ORAL_TABLET | ORAL | Status: DC

## 2014-12-27 NOTE — Telephone Encounter (Signed)
Periodically coughing, several heavy coughing spells a day for a period of time, very little mucus coming up, no fever, no chills, feels a little weak, no wheezing.  Pt had heart attack at 21 and open heart surgery at 39. Wanted to tell you just as a precautionary measure if a steroid is ordered.  Ph # [phone]

## 2014-12-27 NOTE — Telephone Encounter (Signed)
X-rays is benign, question of bronchitis. Please get me more information: Still coughing? Fever chills? Sputum production? Wheezing?

## 2014-12-27 NOTE — Telephone Encounter (Signed)
If there is no history of asthma or COPD, prednisone is not likely indicated. He may have a atypical infection not covered by amoxicillin. Plan: Call in a Z-Pak. Mucinex DM twice a day for one week If he is not better needs to see a PCP

## 2014-12-27 NOTE — Telephone Encounter (Signed)
FYI. Please advise.

## 2014-12-27 NOTE — Telephone Encounter (Signed)
Spoke with Pt, informed him that Dr. Larose Kells believes prednisone is not indicated. But that maybe another round of a different antibiotic will help. Instructed Pt that Dr. Larose Kells called in [place], he will take 2 tablets the first day (today) and 1 tablet all other days until gone. Also informed Pt to get OTC Mucinex DM and to take 1 tablet twice a day for one week. Informed Pt if still not better he will need to see PCP. Pt verbalized understanding.

## 2015-02-05 ENCOUNTER — Institutional Professional Consult (permissible substitution): Payer: Medicare Other | Admitting: Pulmonary Disease

## 2015-02-06 ENCOUNTER — Encounter (HOSPITAL_COMMUNITY): Payer: Medicare Other

## 2015-02-06 ENCOUNTER — Other Ambulatory Visit (HOSPITAL_COMMUNITY): Payer: Medicare Other

## 2015-02-24 ENCOUNTER — Other Ambulatory Visit: Payer: Self-pay | Admitting: Cardiovascular Disease

## 2015-02-24 DIAGNOSIS — I739 Peripheral vascular disease, unspecified: Secondary | ICD-10-CM

## 2015-02-24 DIAGNOSIS — I714 Abdominal aortic aneurysm, without rupture, unspecified: Secondary | ICD-10-CM

## 2015-02-24 DIAGNOSIS — I700 Atherosclerosis of aorta: Secondary | ICD-10-CM

## 2015-02-25 ENCOUNTER — Other Ambulatory Visit: Payer: Self-pay | Admitting: Cardiovascular Disease

## 2015-02-27 ENCOUNTER — Encounter (HOSPITAL_COMMUNITY): Payer: Medicare Other

## 2015-02-27 ENCOUNTER — Ambulatory Visit (HOSPITAL_COMMUNITY)
Admission: RE | Admit: 2015-02-27 | Discharge: 2015-02-27 | Disposition: A | Discharge status: Home / Self Care | Payer: Medicare Other | Source: Ambulatory Visit | Attending: Cardiology | Admitting: Cardiology

## 2015-02-27 ENCOUNTER — Other Ambulatory Visit (HOSPITAL_COMMUNITY): Payer: Medicare Other

## 2015-02-27 ENCOUNTER — Ambulatory Visit (HOSPITAL_BASED_OUTPATIENT_CLINIC_OR_DEPARTMENT_OTHER)
Admission: RE | Admit: 2015-02-27 | Discharge: 2015-02-27 | Disposition: A | Discharge status: Home / Self Care | Payer: Medicare Other | Source: Ambulatory Visit | Attending: Cardiovascular Disease | Admitting: Cardiovascular Disease

## 2015-02-27 DIAGNOSIS — I700 Atherosclerosis of aorta: Secondary | ICD-10-CM | POA: Insufficient documentation

## 2015-02-27 DIAGNOSIS — I739 Peripheral vascular disease, unspecified: Secondary | ICD-10-CM

## 2015-02-27 DIAGNOSIS — E785 Hyperlipidemia, unspecified: Secondary | ICD-10-CM | POA: Diagnosis not present

## 2015-02-27 DIAGNOSIS — I714 Abdominal aortic aneurysm, without rupture, unspecified: Secondary | ICD-10-CM

## 2015-02-27 DIAGNOSIS — I70293 Other atherosclerosis of native arteries of extremities, bilateral legs: Secondary | ICD-10-CM | POA: Insufficient documentation

## 2015-02-27 DIAGNOSIS — I2583 Coronary atherosclerosis due to lipid rich plaque: Principal | ICD-10-CM

## 2015-02-27 DIAGNOSIS — I10 Essential (primary) hypertension: Secondary | ICD-10-CM | POA: Insufficient documentation

## 2015-02-27 DIAGNOSIS — I2510 Atherosclerotic heart disease of native coronary artery without angina pectoris: Secondary | ICD-10-CM | POA: Diagnosis not present

## 2015-11-13 ENCOUNTER — Telehealth: Payer: Self-pay | Admitting: Cardiovascular Disease

## 2015-11-13 NOTE — Telephone Encounter (Signed)
New message   Pt states that he wants a yearly appt for June and I offered first available in sept  Pt declined a PA, he only wants [place]

## 2015-11-13 NOTE — Telephone Encounter (Signed)
Left message on machine for pt to contact the office.   

## 2015-11-13 NOTE — Telephone Encounter (Signed)
I spoke with the pt and have scheduled follow-up on 12/17/15 at 10:45 with Dr Burt Knack.

## 2015-11-18 DIAGNOSIS — D179 Benign lipomatous neoplasm, unspecified: Secondary | ICD-10-CM | POA: Diagnosis not present

## 2015-11-18 DIAGNOSIS — D239 Other benign neoplasm of skin, unspecified: Secondary | ICD-10-CM | POA: Diagnosis not present

## 2015-11-26 ENCOUNTER — Ambulatory Visit: Payer: Medicare Other | Admitting: Internal Medicine

## 2015-12-08 ENCOUNTER — Encounter: Payer: Self-pay | Admitting: Internal Medicine

## 2015-12-08 ENCOUNTER — Other Ambulatory Visit (INDEPENDENT_AMBULATORY_CARE_PROVIDER_SITE_OTHER): Payer: Medicare Other

## 2015-12-08 ENCOUNTER — Ambulatory Visit (INDEPENDENT_AMBULATORY_CARE_PROVIDER_SITE_OTHER): Payer: Medicare Other | Admitting: Internal Medicine

## 2015-12-08 VITALS — BP 138/80 | HR 54 | Ht 67.0 in | Wt 188.0 lb

## 2015-12-08 DIAGNOSIS — E785 Hyperlipidemia, unspecified: Secondary | ICD-10-CM

## 2015-12-08 DIAGNOSIS — E291 Testicular hypofunction: Secondary | ICD-10-CM

## 2015-12-08 DIAGNOSIS — F411 Generalized anxiety disorder: Secondary | ICD-10-CM

## 2015-12-08 DIAGNOSIS — N320 Bladder-neck obstruction: Secondary | ICD-10-CM

## 2015-12-08 DIAGNOSIS — Z0000 Encounter for general adult medical examination without abnormal findings: Secondary | ICD-10-CM

## 2015-12-08 DIAGNOSIS — R0683 Snoring: Secondary | ICD-10-CM

## 2015-12-08 LAB — URINALYSIS
Bilirubin Urine: NEGATIVE
Hgb urine dipstick: NEGATIVE
KETONES UR: NEGATIVE
Leukocytes, UA: NEGATIVE
Nitrite: NEGATIVE
PH: 6 (ref 5.0–8.0)
SPECIFIC GRAVITY, URINE: 1.015 (ref 1.000–1.030)
TOTAL PROTEIN, URINE-UPE24: NEGATIVE
URINE GLUCOSE: NEGATIVE
Urobilinogen, UA: 0.2 (ref 0.0–1.0)

## 2015-12-08 LAB — LIPID PANEL
Cholesterol: 162 mg/dL (ref 0–200)
HDL: 43.6 mg/dL (ref >39.00)
LDL CALC: 89 mg/dL (ref 0–99)
NONHDL: 118.37
Total CHOL/HDL Ratio: 4
Triglycerides: 145 mg/dL (ref 0.0–149.0)
VLDL: 29 mg/dL (ref 0.0–40.0)

## 2015-12-08 LAB — BASIC METABOLIC PANEL
BUN: 12 mg/dL (ref 6–23)
CO2: 27 mEq/L (ref 19–32)
Calcium: 9.8 mg/dL (ref 8.4–10.5)
Chloride: 104 mEq/L (ref 96–112)
Creatinine, Ser: 1.05 mg/dL (ref 0.40–1.50)
GFR: 73.97 mL/min (ref >60.00)
Glucose, Bld: 113 mg/dL — ABNORMAL HIGH (ref 70–99)
POTASSIUM: 4.5 meq/L (ref 3.5–5.1)
Sodium: 140 mEq/L (ref 135–145)

## 2015-12-08 LAB — CBC WITH DIFFERENTIAL/PLATELET
BASOS PCT: 0.5 % (ref 0.0–3.0)
Basophils Absolute: 0.1 10*3/uL (ref 0.0–0.1)
EOS PCT: 2.1 % (ref 0.0–5.0)
Eosinophils Absolute: 0.2 10*3/uL (ref 0.0–0.7)
HEMATOCRIT: 48.2 % (ref 39.0–52.0)
HEMOGLOBIN: 15.8 g/dL (ref 13.0–17.0)
LYMPHS PCT: 34.6 % (ref 12.0–46.0)
Lymphs Abs: 3.4 10*3/uL (ref 0.7–4.0)
MCHC: 32.8 g/dL (ref 30.0–36.0)
MCV: 86.4 fl (ref 78.0–100.0)
MONO ABS: 0.7 10*3/uL (ref 0.1–1.0)
Monocytes Relative: 7.4 % (ref 3.0–12.0)
NEUTROS ABS: 5.5 10*3/uL (ref 1.4–7.7)
Neutrophils Relative %: 55.4 % (ref 43.0–77.0)
Platelets: 167 10*3/uL (ref 150.0–400.0)
RBC: 5.58 Mil/uL (ref 4.22–5.81)
RDW: 14.1 % (ref 11.5–15.5)
WBC: 9.9 10*3/uL (ref 4.0–10.5)

## 2015-12-08 LAB — HEPATIC FUNCTION PANEL
ALT: 34 U/L (ref 0–53)
AST: 31 U/L (ref 0–37)
Albumin: 4.9 g/dL (ref 3.5–5.2)
Alkaline Phosphatase: 84 U/L (ref 39–117)
BILIRUBIN DIRECT: 0.1 mg/dL (ref 0.0–0.3)
BILIRUBIN TOTAL: 0.5 mg/dL (ref 0.2–1.2)
Total Protein: 7.4 g/dL (ref 6.0–8.3)

## 2015-12-08 LAB — PSA: PSA: 0.44 ng/mL (ref 0.10–4.00)

## 2015-12-08 LAB — TSH: TSH: 0.99 u[IU]/mL (ref 0.35–4.50)

## 2015-12-08 LAB — HEPATITIS C ANTIBODY: HCV Ab: NEGATIVE

## 2015-12-08 LAB — TESTOSTERONE: Testosterone: 209.04 ng/dL — ABNORMAL LOW (ref 300.00–890.00)

## 2015-12-08 MED ORDER — ALPRAZOLAM 0.5 MG PO TABS: 0.5000 mg | ORAL_TABLET | Freq: Every evening | ORAL | Status: DC | PRN

## 2015-12-08 MED ORDER — NORTRIPTYLINE HCL 10 MG PO CAPS: 10.0000 mg | ORAL_CAPSULE | Freq: Every day | ORAL | Status: DC

## 2015-12-08 MED ORDER — NITROGLYCERIN 0.4 MG SL SUBL: 0.4000 mg | SUBLINGUAL_TABLET | SUBLINGUAL | Status: DC | PRN

## 2015-12-08 NOTE — Assessment & Plan Note (Signed)
On alprazolam low dose prn  Potential benefits of a long term benzodiazepines  use as well as potential risks  and complications were explained to the patient and were aknowledged. 

## 2015-12-08 NOTE — Assessment & Plan Note (Signed)
Labs

## 2015-12-08 NOTE — Assessment & Plan Note (Signed)
meds - atorvastatin 80, zetia 10 , niaspan 500 Labs

## 2015-12-08 NOTE — Progress Notes (Signed)
Subjective:    HPI   The patient is here for a wellness exam. The patient has been doing well overall without major physical or psychological issues going on lately, except for anxiety... The patient needs to address  Chronic CAD, PVD,  hypertension that has been well controlled with medicines; to address chronic  hyperlipidemia controlled with medicines as well. F/u probable OSA - he never had a sleep test  BP Readings from Last 3 Encounters:  12/08/15 138/80  12/26/14 115/64  12/04/14 126/64   Wt Readings from Last 3 Encounters:  12/08/15 188 lb (85.276 kg)  12/26/14 185 lb 12.8 oz (84.278 kg)  12/04/14 188 lb (85.276 kg)      Review of Systems  Constitutional: Negative for appetite change, fatigue and unexpected weight change.  HENT: Negative for congestion, nosebleeds, sneezing, sore throat and trouble swallowing.   Eyes: Negative for itching and visual disturbance.  Respiratory: Negative for cough.   Cardiovascular: Positive for chest pain. Negative for palpitations and leg swelling.  Gastrointestinal: Negative for nausea, diarrhea, blood in stool and abdominal distention.  Genitourinary: Negative for frequency and hematuria.  Musculoskeletal: Positive for myalgias. Negative for back pain, joint swelling, gait problem and neck pain.  Skin: Negative for rash.  Neurological: Negative for dizziness, tremors, speech difficulty and weakness.  Psychiatric/Behavioral: Negative for sleep disturbance, dysphoric mood and agitation. The patient is not nervous/anxious.        Objective:   Physical Exam  Constitutional: He is oriented to person, place, and time. He appears well-developed and well-nourished. No distress.  HENT:  Head: Normocephalic and atraumatic.  Right Ear: External ear normal.  Left Ear: External ear normal.  Nose: Nose normal.  Mouth/Throat: Oropharynx is clear and moist. No oropharyngeal exudate.  Eyes: Conjunctivae and EOM are normal. Pupils are equal,  round, and reactive to light. Right eye exhibits no discharge. Left eye exhibits no discharge. No scleral icterus.  Neck: Normal range of motion. Neck supple. No JVD present. No tracheal deviation present. No thyromegaly present.  Cardiovascular: Normal rate, regular rhythm, normal heart sounds and intact distal pulses.  Exam reveals no gallop and no friction rub.   No murmur heard. Pulmonary/Chest: Effort normal and breath sounds normal. No stridor. No respiratory distress. He has no wheezes. He has no rales. He exhibits no tenderness.  Abdominal: Soft. Bowel sounds are normal. He exhibits no distension and no mass. There is no tenderness. There is no rebound and no guarding.  Genitourinary: Rectum normal, prostate normal and penis normal. Guaiac negative stool. No penile tenderness.  Musculoskeletal: Normal range of motion. He exhibits no edema or tenderness.  Lymphadenopathy:    He has no cervical adenopathy.  Neurological: He is alert and oriented to person, place, and time. He has normal reflexes. No cranial nerve deficit. He exhibits normal muscle tone. Coordination normal.  Skin: Skin is warm and dry. No rash noted. He is not diaphoretic. No erythema. No pallor.  Psychiatric: He has a normal mood and affect. His behavior is normal. Judgment and thought content normal.  rectal - WNL  Lab Results  Component Value Date   WBC 8.1 11/27/2014   HGB 16.1 11/27/2014   HCT 47.4 11/27/2014   PLT 153.0 11/27/2014   GLUCOSE 135* 11/27/2014   CHOL 153 11/27/2014   TRIG 70.0 11/27/2014   HDL 43.80 11/27/2014   LDLCALC 95 11/27/2014   ALT 28 11/22/2013   AST 31 11/22/2013   NA 139 11/27/2014   K 5.0  11/27/2014   CL 105 11/27/2014   CREATININE 1.14 11/27/2014   BUN 14 11/27/2014   CO2 30 11/27/2014   TSH 1.64 11/27/2014   PSA 0.40 11/27/2014   INR 1.1 RATIO* 05/16/2008   HGBA1C 6.5 11/27/2014         Assessment & Plan:

## 2015-12-08 NOTE — Progress Notes (Signed)
Pre visit review using our clinic review tool, if applicable. No additional management support is needed unless otherwise documented below in the visit note. 

## 2015-12-08 NOTE — Patient Instructions (Signed)
Preventive Care for Adults, Male A healthy lifestyle and preventive care can promote health and wellness. Preventive health guidelines for men include the following key practices:  A routine yearly physical is a good way to check with your health care provider about your health and preventative screening. It is a chance to share any concerns and updates on your health and to receive a thorough exam.  Visit your dentist for a routine exam and preventative care every 6 months. Brush your teeth twice a day and floss once a day. Good oral hygiene prevents tooth decay and gum disease.  The frequency of eye exams is based on your age, health, family medical history, use of contact lenses, and other factors. Follow your health care provider's recommendations for frequency of eye exams.  Eat a healthy diet. Foods such as vegetables, fruits, whole grains, low-fat dairy products, and lean protein foods contain the nutrients you need without too many calories. Decrease your intake of foods high in solid fats, added sugars, and salt. Eat the right amount of calories for you.Get information about a proper diet from your health care provider, if necessary.  Regular physical exercise is one of the most important things you can do for your health. Most adults should get at least 150 minutes of moderate-intensity exercise (any activity that increases your heart rate and causes you to sweat) each week. In addition, most adults need muscle-strengthening exercises on 2 or more days a week.  Maintain a healthy weight. The body mass index (BMI) is a screening tool to identify possible weight problems. It provides an estimate of body fat based on height and weight. Your health care provider can find your BMI and can help you achieve or maintain a healthy weight.For adults 20 years and older:  A BMI below 18.5 is considered underweight.  A BMI of 18.5 to 24.9 is normal.  A BMI of 25 to 29.9 is considered  overweight.  A BMI of 30 and above is considered obese.  Maintain normal blood lipids and cholesterol levels by exercising and minimizing your intake of saturated fat. Eat a balanced diet with plenty of fruit and vegetables. Blood tests for lipids and cholesterol should begin at age 20 and be repeated every 5 years. If your lipid or cholesterol levels are high, you are over 50, or you are at high risk for heart disease, you may need your cholesterol levels checked more frequently.Ongoing high lipid and cholesterol levels should be treated with medicines if diet and exercise are not working.  If you smoke, find out from your health care provider how to quit. If you do not use tobacco, do not start.  Lung cancer screening is recommended for adults aged 55-80 years who are at high risk for developing lung cancer because of a history of smoking. A yearly low-dose CT scan of the lungs is recommended for people who have at least a 30-pack-year history of smoking and are a current smoker or have quit within the past 15 years. A pack year of smoking is smoking an average of 1 pack of cigarettes a day for 1 year (for example: 1 pack a day for 30 years or 2 packs a day for 15 years). Yearly screening should continue until the smoker has stopped smoking for at least 15 years. Yearly screening should be stopped for people who develop a health problem that would prevent them from having lung cancer treatment.  If you choose to drink alcohol, do not have more   than 2 drinks per day. One drink is considered to be 12 ounces (355 mL) of beer, 5 ounces (148 mL) of wine, or 1.5 ounces (44 mL) of liquor.  Avoid use of street drugs. Do not share needles with anyone. Ask for help if you need support or instructions about stopping the use of drugs.  High blood pressure causes heart disease and increases the risk of stroke. Your blood pressure should be checked at least every 1-2 years. Ongoing high blood pressure should be  treated with medicines, if weight loss and exercise are not effective.  If you are 34-90 years old, ask your health care provider if you should take aspirin to prevent heart disease.  Diabetes screening is done by taking a blood sample to check your blood glucose level after you have not eaten for a certain period of time (fasting). If you are not overweight and you do not have risk factors for diabetes, you should be screened once every 3 years starting at age 35. If you are overweight or obese and you are 70-84 years of age, you should be screened for diabetes every year as part of your cardiovascular risk assessment.  Colorectal cancer can be detected and often prevented. Most routine colorectal cancer screening begins at the age of 18 and continues through age 69. However, your health care provider may recommend screening at an earlier age if you have risk factors for colon cancer. On a yearly basis, your health care provider may provide home test kits to check for hidden blood in the stool. Use of a small camera at the end of a tube to directly examine the colon (sigmoidoscopy or colonoscopy) can detect the earliest forms of colorectal cancer. Talk to your health care provider about this at age 71, when routine screening begins. Direct exam of the colon should be repeated every 5-10 years through age 18, unless early forms of precancerous polyps or small growths are found.  People who are at an increased risk for hepatitis B should be screened for this virus. You are considered at high risk for hepatitis B if:  You were born in a country where hepatitis B occurs often. Talk with your health care provider about which countries are considered high risk.  Your parents were born in a high-risk country and you have not received a shot to protect against hepatitis B (hepatitis B vaccine).  You have HIV or AIDS.  You use needles to inject street drugs.  You live with, or have sex with, someone who  has hepatitis B.  You are a man who has sex with other men (MSM).  You get hemodialysis treatment.  You take certain medicines for conditions such as cancer, organ transplantation, and autoimmune conditions.  Hepatitis C blood testing is recommended for all people born from [birth year] through 1965 and any individual with known risks for hepatitis C.  Practice safe sex. Use condoms and avoid high-risk sexual practices to reduce the spread of sexually transmitted infections (STIs). STIs include gonorrhea, chlamydia, syphilis, trichomonas, herpes, HPV, and human immunodeficiency virus (HIV). Herpes, HIV, and HPV are viral illnesses that have no cure. They can result in disability, cancer, and death.  If you are a man who has sex with other men, you should be screened at least once per year for:  HIV.  Urethral, rectal, and pharyngeal infection of gonorrhea, chlamydia, or both.  If you are at risk of being infected with HIV, it is recommended that you take a  prescription medicine daily to prevent HIV infection. This is called preexposure prophylaxis (PrEP). You are considered at risk if:  You are a man who has sex with other men (MSM) and have other risk factors.  You are a heterosexual man, are sexually active, and are at increased risk for HIV infection.  You take drugs by injection.  You are sexually active with a partner who has HIV.  Talk with your health care provider about whether you are at high risk of being infected with HIV. If you choose to begin PrEP, you should first be tested for HIV. You should then be tested every 3 months for as long as you are taking PrEP.  A one-time screening for abdominal aortic aneurysm (AAA) and surgical repair of large AAAs by ultrasound are recommended for men ages 44 to 66 years who are current or former smokers.  Healthy men should no longer receive prostate-specific antigen (PSA) blood tests as part of routine cancer screening. Talk with your health  care provider about prostate cancer screening.  Testicular cancer screening is not recommended for adult males who have no symptoms. Screening includes self-exam, a health care provider exam, and other screening tests. Consult with your health care provider about any symptoms you have or any concerns you have about testicular cancer.  Use sunscreen. Apply sunscreen liberally and repeatedly throughout the day. You should seek shade when your shadow is shorter than you. Protect yourself by wearing long sleeves, pants, a wide-brimmed hat, and sunglasses year round, whenever you are outdoors.  Once a month, do a whole-body skin exam, using a mirror to look at the skin on your back. Tell your health care provider about new moles, moles that have irregular borders, moles that are larger than a pencil eraser, or moles that have changed in shape or color.  Stay current with required vaccines (immunizations).  Influenza vaccine. All adults should be immunized every year.  Tetanus, diphtheria, and acellular pertussis (Td, Tdap) vaccine. An adult who has not previously received Tdap or who does not know his vaccine status should receive 1 dose of Tdap. This initial dose should be followed by tetanus and diphtheria toxoids (Td) booster doses every 10 years. Adults with an unknown or incomplete history of completing a 3-dose immunization series with Td-containing vaccines should begin or complete a primary immunization series including a Tdap dose. Adults should receive a Td booster every 10 years.  Varicella vaccine. An adult without evidence of immunity to varicella should receive 2 doses or a second dose if he has previously received 1 dose.  Human papillomavirus (HPV) vaccine. Males aged 11-21 years who have not received the vaccine previously should receive the 3-dose series. Males aged 22-26 years may be immunized. Immunization is recommended through the age of 23 years for any male who has sex with males  and did not get any or all doses earlier. Immunization is recommended for any person with an immunocompromised condition through the age of 72 years if he did not get any or all doses earlier. During the 3-dose series, the second dose should be obtained 4-8 weeks after the first dose. The third dose should be obtained 24 weeks after the first dose and 16 weeks after the second dose.  Zoster vaccine. One dose is recommended for adults aged 23 years or older unless certain conditions are present.  Measles, mumps, and rubella (MMR) vaccine. Adults born before [birth year] generally are considered immune to measles and mumps. Adults born in [birth year]  or later should have 1 or more doses of MMR vaccine unless there is a contraindication to the vaccine or there is laboratory evidence of immunity to each of the three diseases. A routine second dose of MMR vaccine should be obtained at least 28 days after the first dose for students attending postsecondary schools, health care workers, or international travelers. People who received inactivated measles vaccine or an unknown type of measles vaccine during 1963-1967 should receive 2 doses of MMR vaccine. People who received inactivated mumps vaccine or an unknown type of mumps vaccine before 1979 and are at high risk for mumps infection should consider immunization with 2 doses of MMR vaccine. Unvaccinated health care workers born before [birth year] who lack laboratory evidence of measles, mumps, or rubella immunity or laboratory confirmation of disease should consider measles and mumps immunization with 2 doses of MMR vaccine or rubella immunization with 1 dose of MMR vaccine.  Pneumococcal 13-valent conjugate (PCV13) vaccine. When indicated, a person who is uncertain of his immunization history and has no record of immunization should receive the PCV13 vaccine. All adults 9 years of age and older should receive this vaccine. An adult aged 69 years or older who has certain medical  conditions and has not been previously immunized should receive 1 dose of PCV13 vaccine. This PCV13 should be followed with a dose of pneumococcal polysaccharide (PPSV23) vaccine. Adults who are at high risk for pneumococcal disease should obtain the PPSV23 vaccine at least 8 weeks after the dose of PCV13 vaccine. Adults older than 71 years of age who have normal immune system function should obtain the PPSV23 vaccine dose at least 1 year after the dose of PCV13 vaccine.  Pneumococcal polysaccharide (PPSV23) vaccine. When PCV13 is also indicated, PCV13 should be obtained first. All adults aged 79 years and older should be immunized. An adult younger than age 43 years who has certain medical conditions should be immunized. Any person who resides in a nursing home or long-term care facility should be immunized. An adult smoker should be immunized. People with an immunocompromised condition and certain other conditions should receive both PCV13 and PPSV23 vaccines. People with human immunodeficiency virus (HIV) infection should be immunized as soon as possible after diagnosis. Immunization during chemotherapy or radiation therapy should be avoided. Routine use of PPSV23 vaccine is not recommended for American Indians, [place] Natives, or people younger than 65 years unless there are medical conditions that require PPSV23 vaccine. When indicated, people who have unknown immunization and have no record of immunization should receive PPSV23 vaccine. One-time revaccination 5 years after the first dose of PPSV23 is recommended for people aged 19-64 years who have chronic kidney failure, nephrotic syndrome, asplenia, or immunocompromised conditions. People who received 1-2 doses of PPSV23 before age 70 years should receive another dose of PPSV23 vaccine at age 79 years or later if at least 5 years have passed since the previous dose. Doses of PPSV23 are not needed for people immunized with PPSV23 at or after age 55  years.  Meningococcal vaccine. Adults with asplenia or persistent complement component deficiencies should receive 2 doses of quadrivalent meningococcal conjugate (MenACWY-D) vaccine. The doses should be obtained at least 2 months apart. Microbiologists working with certain meningococcal bacteria, [employer] recruits, people at risk during an outbreak, and people who travel to or live in countries with a high rate of meningitis should be immunized. A first-year college student up through age 64 years who is living in a residence hall should receive a  dose if he did not receive a dose on or after his 16th birthday. Adults who have certain high-risk conditions should receive one or more doses of vaccine.  Hepatitis A vaccine. Adults who wish to be protected from this disease, have chronic liver disease, work with hepatitis A-infected animals, work in hepatitis A research labs, or travel to or work in countries with a high rate of hepatitis A should be immunized. Adults who were previously unvaccinated and who anticipate close contact with an international adoptee during the first 60 days after arrival in the [place] from a country with a high rate of hepatitis A should be immunized.  Hepatitis B vaccine. Adults should be immunized if they wish to be protected from this disease, are under age 34 years and have diabetes, have chronic liver disease, have had more than one sex partner in the past 6 months, may be exposed to blood or other infectious body fluids, are household contacts or sex partners of hepatitis B positive people, are clients or workers in certain care facilities, or travel to or work in countries with a high rate of hepatitis B.  Haemophilus influenzae type b (Hib) vaccine. A previously unvaccinated person with asplenia or sickle cell disease or having a scheduled splenectomy should receive 1 dose of Hib vaccine. Regardless of previous immunization, a recipient of a hematopoietic stem cell  transplant should receive a 3-dose series 6-12 months after his successful transplant. Hib vaccine is not recommended for adults with HIV infection. Preventive Service / Frequency Ages 77 to 55  Blood pressure check.** / Every 3-5 years.  Lipid and cholesterol check.** / Every 5 years beginning at age 66.  Hepatitis C blood test.** / For any individual with known risks for hepatitis C.  Skin self-exam. / Monthly.  Influenza vaccine. / Every year.  Tetanus, diphtheria, and acellular pertussis (Tdap, Td) vaccine.** / Consult your health care provider. 1 dose of Td every 10 years.  Varicella vaccine.** / Consult your health care provider.  HPV vaccine. / 3 doses over 6 months, if 45 or younger.  Measles, mumps, rubella (MMR) vaccine.** / You need at least 1 dose of MMR if you were born in 1957 or later. You may also need a second dose.  Pneumococcal 13-valent conjugate (PCV13) vaccine.** / Consult your health care provider.  Pneumococcal polysaccharide (PPSV23) vaccine.** / 1 to 2 doses if you smoke cigarettes or if you have certain conditions.  Meningococcal vaccine.** / 1 dose if you are age 81 to 79 years and a [employer] researcher living in a residence hall, or have one of several medical conditions. You may also need additional booster doses.  Hepatitis A vaccine.** / Consult your health care provider.  Hepatitis B vaccine.** / Consult your health care provider.  Haemophilus influenzae type b (Hib) vaccine.** / Consult your health care provider. Ages 6 to 58  Blood pressure check.** / Every year.  Lipid and cholesterol check.** / Every 5 years beginning at age 89.  Lung cancer screening. / Every year if you are aged 84-80 years and have a 30-pack-year history of smoking and currently smoke or have quit within the past 15 years. Yearly screening is stopped once you have quit smoking for at least 15 years or develop a health problem that would prevent you from having  lung cancer treatment.  Fecal occult blood test (FOBT) of stool. / Every year beginning at age 90 and continuing until age 73. You may not have to do  this test if you get a colonoscopy every 10 years.  Flexible sigmoidoscopy** or colonoscopy.** / Every 5 years for a flexible sigmoidoscopy or every 10 years for a colonoscopy beginning at age 50 and continuing until age 75.  Hepatitis C blood test.** / For all people born from 1945 through 1965 and any individual with known risks for hepatitis C.  Skin self-exam. / Monthly.  Influenza vaccine. / Every year.  Tetanus, diphtheria, and acellular pertussis (Tdap/Td) vaccine.** / Consult your health care provider. 1 dose of Td every 10 years.  Varicella vaccine.** / Consult your health care provider.  Zoster vaccine.** / 1 dose for adults aged 60 years or older.  Measles, mumps, rubella (MMR) vaccine.** / You need at least 1 dose of MMR if you were born in 1957 or later. You may also need a second dose.  Pneumococcal 13-valent conjugate (PCV13) vaccine.** / Consult your health care provider.  Pneumococcal polysaccharide (PPSV23) vaccine.** / 1 to 2 doses if you smoke cigarettes or if you have certain conditions.  Meningococcal vaccine.** / Consult your health care provider.  Hepatitis A vaccine.** / Consult your health care provider.  Hepatitis B vaccine.** / Consult your health care provider.  Haemophilus influenzae type b (Hib) vaccine.** / Consult your health care provider. Ages 65 and over  Blood pressure check.** / Every year.  Lipid and cholesterol check.**/ Every 5 years beginning at age 20.  Lung cancer screening. / Every year if you are aged 55-80 years and have a 30-pack-year history of smoking and currently smoke or have quit within the past 15 years. Yearly screening is stopped once you have quit smoking for at least 15 years or develop a health problem that would prevent you from having lung cancer treatment.  Fecal  occult blood test (FOBT) of stool. / Every year beginning at age 50 and continuing until age 75. You may not have to do this test if you get a colonoscopy every 10 years.  Flexible sigmoidoscopy** or colonoscopy.** / Every 5 years for a flexible sigmoidoscopy or every 10 years for a colonoscopy beginning at age 50 and continuing until age 75.  Hepatitis C blood test.** / For all people born from 1945 through 1965 and any individual with known risks for hepatitis C.  Abdominal aortic aneurysm (AAA) screening.** / A one-time screening for ages 65 to 75 years who are current or former smokers.  Skin self-exam. / Monthly.  Influenza vaccine. / Every year.  Tetanus, diphtheria, and acellular pertussis (Tdap/Td) vaccine.** / 1 dose of Td every 10 years.  Varicella vaccine.** / Consult your health care provider.  Zoster vaccine.** / 1 dose for adults aged 60 years or older.  Pneumococcal 13-valent conjugate (PCV13) vaccine.** / 1 dose for all adults aged 65 years and older.  Pneumococcal polysaccharide (PPSV23) vaccine.** / 1 dose for all adults aged 65 years and older.  Meningococcal vaccine.** / Consult your health care provider.  Hepatitis A vaccine.** / Consult your health care provider.  Hepatitis B vaccine.** / Consult your health care provider.  Haemophilus influenzae type b (Hib) vaccine.** / Consult your health care provider. **Family history and personal history of risk and conditions may change your health care provider's recommendations.   This information is not intended to replace advice given to you by your health care provider. Make sure you discuss any questions you have with your health care provider.   Document Released: 08/17/2001 Document Revised: 07/12/2014 Document Reviewed: 11/16/2010 Elsevier Interactive Patient Education 2016   [place].   There are natural ways to boost your testosterone:  1. Lose Weight If you're overweight, shedding the excess pounds  may increase your testosterone levels, according to multiple research. Overweight men are more likely to have low testosterone levels to begin with, so this is an important trick to increase your body's testosterone production when you need it most.   2. Strength Training    Strength training is also known to boost testosterone levels, provided you are doing so intensely enough. When strength training to boost testosterone, you'll want to increase the weight and lower your number of reps, and then focus on exercises that work a large number of muscles.  3. Optimize Your Vitamin D Levels Vitamin D, a steroid hormone, is essential for the healthy development of the nucleus of the sperm cell, and helps maintain semen quality and sperm count. Vitamin D also increases levels of testosterone, which may boost libido. In one study, overweight men who were given vitamin D supplements had a significant increase in testosterone levels after one year.  4. Reduce Stress When you're under a lot of stress, your body releases high levels of the stress hormone cortisol. This hormone actually blocks the effects of testosterone, presumably because, from a biological standpoint, testosterone-associated behaviors (mating, competing, aggression) may have lowered your chances of survival in an emergency (hence, the "fight or flight" response is dominant, courtesy of cortisol).  5. Limit or Eliminate Sugar from Your Diet Testosterone levels decrease after you eat sugar, which is likely because the sugar leads to a high insulin level, another factor leading to low testosterone.  6. Eat Healthy Fats By healthy, this means not only mon- and polyunsaturated fats, like that found in avocadoes and nuts, but also saturated, as these are essential for building testosterone. Research shows that a diet with less than 40 percent of energy as fat (and that mainly from animal sources, i.e. saturated) lead to a decrease in testosterone  levels.  It's important to understand that your body requires saturated fats from animal and vegetable sources (such as meat, dairy, certain oils, and tropical plants like coconut) for optimal functioning, and if you neglect this important food group in favor of sugar, grains and other starchy carbs, your health and weight are almost guaranteed to suffer. Examples of healthy fats you can eat more of to give your testosterone levels a boost include:  Olives and Olive oil  Coconuts and coconut oil Butter made from organic milk  Raw nuts, such as, almonds or pecans Eggs Avocados   Meats Palm oil Unheated organic nut oils   7. "Testosterone boosters" containing Vitamin D-3, Niacin, Vitamin B-6, Vitamin B-12, Magnesium, Zinc, Selenium, D-Aspartic Acid, Fenugreed Seed Extract, Oyst[place]hell, [place] Extract, [place] Ginseng may be helpful as well.

## 2015-12-08 NOTE — Assessment & Plan Note (Signed)
Colonoscopy - need last report from Dr. Medoff (May '13)   Here for medicare wellness/physical  Diet: heart healthy  Physical activity: not sedentary  Depression/mood screen: negative  Hearing: intact to whispered voice  Visual acuity: grossly normal, performs annual eye exam  ADLs: capable  Fall risk: none  Home safety: good  Cognitive evaluation: intact to orientation, naming, recall and repetition  EOL planning: adv directives, full code/ I agree  I have personally reviewed and have noted  1. The patient's medical, surgical and social history  2. Their use of alcohol, tobacco or illicit drugs  3. Their current medications and supplements  4. The patient's functional ability including ADL's, fall risks, home safety risks and hearing or visual impairment.  5. Diet and physical activities  6. Evidence for depression or mood disorders 7. The roster of all physicians providing medical care to patient - is listed in the Snapshot section of the chart and reviewed today.    Today patient counseled on age appropriate routine health concerns for screening and prevention, each reviewed and up to date or declined. Immunizations reviewed and up to date or declined. Labs ordered and reviewed. Risk factors for depression reviewed and negative. Hearing function and visual acuity are intact. ADLs screened and addressed as needed. Functional ability and level of safety reviewed and appropriate. Education, counseling and referrals performed based on assessed risks today. Patient provided with a copy of personalized plan for preventive services.       

## 2015-12-08 NOTE — Assessment & Plan Note (Signed)
Testosterone Rx  intolerant

## 2015-12-17 ENCOUNTER — Ambulatory Visit (INDEPENDENT_AMBULATORY_CARE_PROVIDER_SITE_OTHER): Payer: Medicare Other | Admitting: Cardiovascular Disease

## 2015-12-17 ENCOUNTER — Encounter: Payer: Self-pay | Admitting: Cardiovascular Disease

## 2015-12-17 VITALS — BP 150/78 | HR 66 | Ht 67.0 in | Wt 189.2 lb

## 2015-12-17 DIAGNOSIS — I2510 Atherosclerotic heart disease of native coronary artery without angina pectoris: Secondary | ICD-10-CM

## 2015-12-17 DIAGNOSIS — I739 Peripheral vascular disease, unspecified: Secondary | ICD-10-CM

## 2015-12-17 NOTE — Patient Instructions (Signed)
Medication Instructions:  Your physician recommends that you continue on your current medications as directed. Please refer to the Current Medication list given to you today.  Labwork: No new orders.   Testing/Procedures: Your physician has requested that you have an ankle brachial index (ABI) in August. During this test an ultrasound and blood pressure cuff are used to evaluate the arteries that supply the arms and legs with blood. Allow thirty minutes for this exam. There are no restrictions or special instructions.  Your physician has requested that you have an abdominal aorta duplex in August. During this test, an ultrasound is used to evaluate the aorta. Allow 30 minutes for this exam. Do not eat after midnight the day before and avoid carbonated beverages  Follow-Up: Your physician wants you to follow-up in: 1 YEAR with Dr Burt Knack.  You will receive a reminder letter in the mail two months in advance. If you don't receive a letter, please call our office to schedule the follow-up appointment.   Any Other Special Instructions Will Be Listed Below (If Applicable).     If you need a refill on your cardiac medications before your next appointment, please call your pharmacy.

## 2015-12-17 NOTE — Progress Notes (Signed)
Cardiology Office Note Date:  12/17/2015   ID:  Jaydon, Puskarich June 15, 1946, MRN [id number]  PCP:  Walker Kehr, MD  Cardiologist:  Sherren Mocha, MD    Chief Complaint  Patient presents with  . Coronary Artery Disease  . Essential Hypertension    History of Present Illness: DENARDO HIGGS is a 70 y.o. male who presents for follow-up evaluation.  The patient has a history of premature coronary artery disease with remote myocardial infarction at age 13. He underwent CABG many years ago at age 65. He's also followed for peripheral arterial disease with distal abdominal aortic stenosis. His ABIs have been normal. He's had minor nonobstructive carotid stenosis. His last nuclear stress test was in 2013 when he had complained of chest discomfort. This showed a small, mild basal inferior perfusion defect with reversibility, described as a "low risk study." His LVEF was 57%.   The patient has developed some problems with anxiety. He's being treated by Dr Alain [facility]. He reports no new cardiac symptoms. He denies chest pain or shortness of breath. He continues to have buttock claudication with moderate distance walking. States that when he walks regularly his symptoms improved, but he really has not been engaged in any regular exercise. He also has some calf discomfort with walking. Symptoms have not progressed and he denies rest pain.  Past Medical History  Diagnosis Date  . Myocardial infarct, old   . Hypertension   . ED (erectile dysfunction)   . Hyperglycemia     Borderline  . Obesity     Class I  . PVD (peripheral vascular disease) ([place])     ABI June '12 -0.9 bilateral.  . Frequent urination at night     nocturia 2-3  . GERD (gastroesophageal reflux disease)   . Hyperlipidemia     Atherogenic  . CAD (coronary artery disease)     premature  . DDD (degenerative disc disease)     lumbar spine  . ABDOMINAL AORTIC ANEURYSM 11/12/2008    has annual U/s    Past Surgical History    Procedure Laterality Date  . Coronary artery bypass graft    . Tonsillectomy      age 52  . Colonoscopy      one month ago    Current Outpatient Prescriptions  Medication Sig Dispense Refill  . ALPRAZolam (XANAX) 0.5 MG tablet Take 1 tablet (0.5 mg total) by mouth at bedtime as needed for anxiety. 90 tablet 3  . atorvastatin (LIPITOR) 80 MG tablet Take 1 tablet by mouth  daily 90 tablet 3  . b complex vitamins capsule Take 1 capsule by mouth daily.      . Cholecalciferol (VITAMIN D) 2000 UNIT CAPS Take 2,000 capsules by mouth daily.      . clopidogrel (PLAVIX) 75 MG tablet Take 1 tablet by mouth  every day 90 tablet 3  . losartan (COZAAR) 25 MG tablet Take 1 tablet by mouth  daily 90 tablet 3  . metoprolol succinate (TOPROL-XL) 50 MG 24 hr tablet Take 1 tablet by mouth  daily with or immediatley  following a meal 90 tablet 3  . niacin (NIASPAN) 500 MG CR tablet Take 2,000 mg by mouth at bedtime.      . nitroGLYCERIN (NITROSTAT) 0.4 MG SL tablet Place 1 tablet (0.4 mg total) under the tongue every 5 (five) minutes as needed for chest pain. 20 tablet 5  . nortriptyline (PAMELOR) 10 MG capsule Take 1 capsule (10 mg total) by  mouth at bedtime. 30 capsule 5  . pantoprazole (PROTONIX) 40 MG tablet Take 1 tablet by mouth  daily 90 tablet 3  . ZETIA 10 MG tablet Take 1 tablet by mouth  daily 90 tablet 3   No current facility-administered medications for this visit.    Allergies:   Review of patient's allergies indicates no known allergies.   Social History:  The patient  reports that he quit smoking about 46 years ago. His smoking use included Cigarettes. He has a 16 pack-year smoking history. He has never used smokeless tobacco. He reports that he drinks about 1.0 oz of alcohol per week. He reports that he does not use illicit drugs.   Family History:  The patient's  family history includes Cancer in his mother; Early death in his father; Heart disease in his father, mother, and paternal  grandfather; Hyperlipidemia in his mother; Prostate cancer in his brother.    ROS:  Please see the history of present illness.  Otherwise, review of systems is positive for anxiety.  All other systems are reviewed and negative.    PHYSICAL EXAM: VS:  BP 150/78 mmHg  Pulse 66  Ht 5\' 7"  (1.702 m)  Wt 189 lb 3.2 oz (85.821 kg)  BMI 29.63 kg/m2 , BMI Body mass index is 29.63 kg/(m^2). GEN: Well nourished, well developed, in no acute distress HEENT: normal Neck: no JVD, no masses. No carotid bruits Cardiac: RRR without murmur or gallop                Respiratory:  clear to auscultation bilaterally, normal work of breathing GI: soft, nontender, nondistended, + BS MS: no deformity or atrophy Ext: no pretibial edema, pedal pulses 2+= bilaterally Skin: warm and dry, no rash Neuro:  Strength and sensation are intact Psych: euthymic mood, full affect  EKG:  EKG is ordered today. The ekg ordered today shows Normal sinus rhythm 69 bpm with premature atrial contractions, age indeterminate inferior infarct.  Recent Labs: 12/08/2015: ALT 34; BUN 12; Creatinine, Ser 1.05; Hemoglobin 15.8; Platelets 167.0; Potassium 4.5; Sodium 140; TSH 0.99   Lipid Panel     Component Value Date/Time   CHOL 162 12/08/2015 0908   TRIG 145.0 12/08/2015 0908   HDL 43.60 12/08/2015 0908   CHOLHDL 4 12/08/2015 0908   VLDL 29.0 12/08/2015 0908   LDLCALC 89 12/08/2015 0908      Wt Readings from Last 3 Encounters:  12/17/15 189 lb 3.2 oz (85.821 kg)  12/08/15 188 lb (85.276 kg)  12/26/14 185 lb 12.8 oz (84.278 kg)    ASSESSMENT AND PLAN: 1.  CAD, native vessel, without symptoms of angina: The patient is stable and doing well on his current medical program. He is on antiplatelet monotherapy with Plavix. He was having a lot of bruising with aspirin. I think this is a good strategy for him. He is on aggressive risk reduction program. I have reviewed his most recent nuclear stress test and he appears to be  stable.  2. Peripheral arterial disease with intermittent claudication: The patient has distal abdominal aortic stenosis and bilateral iliac stenosis. Will update his noninvasive vascular studies in August when he is out to one year from his previous studies. We had a lengthy discussion today about the importance of a regular walking program. Will refer him for vascular consultation if his arterial occlusive disease is progressing.  3. Essential hypertension: Blood pressure has been controlled on his current medical program. He overslept today and is late for his  appointment. I think his blood pressure is only elevated because he rushed in and was 20 minutes late.  4. Hyperlipidemia: He's treated with high-dose atorvastatin and zetia.  Current medicines are reviewed with the patient today.  The patient does not have concerns regarding medicines.  Labs/ tests ordered today include:   Orders Placed This Encounter  Procedures  . EKG 12-Lead    Disposition:   FU one year  Signed, Sherren Mocha, MD  12/17/2015 5:28 PM    [address], [place], [place]  [address] Phone: [phone]; Fax: [phone]

## 2016-01-19 ENCOUNTER — Other Ambulatory Visit: Payer: Self-pay | Admitting: Cardiovascular Disease

## 2016-02-04 ENCOUNTER — Encounter: Payer: Self-pay | Admitting: Pulmonary Disease

## 2016-02-04 ENCOUNTER — Ambulatory Visit (INDEPENDENT_AMBULATORY_CARE_PROVIDER_SITE_OTHER): Payer: Medicare Other | Admitting: Pulmonary Disease

## 2016-02-04 VITALS — BP 118/80 | HR 70 | Ht 67.0 in | Wt 191.0 lb

## 2016-02-04 DIAGNOSIS — G4733 Obstructive sleep apnea (adult) (pediatric): Secondary | ICD-10-CM | POA: Diagnosis not present

## 2016-02-04 DIAGNOSIS — F411 Generalized anxiety disorder: Secondary | ICD-10-CM

## 2016-02-04 DIAGNOSIS — I2510 Atherosclerotic heart disease of native coronary artery without angina pectoris: Secondary | ICD-10-CM | POA: Diagnosis not present

## 2016-02-04 NOTE — Assessment & Plan Note (Signed)
Discussed treatment options including CPAP therapy and dental appliance- he feels that he would have similar problems with a dental appliance.  Best approach here would be to repeat a home sleep study-if degree of sleep disorder breathing is mild and perhaps we can get by with positional therapy alone. Is moderate or more, then he would need CPAP therapy with nasal pillows-since he would like to avoid a visit to the lab due to severe anxiety, we can perhaps trial of auto CPAP

## 2016-02-04 NOTE — Assessment & Plan Note (Signed)
Okay to take xanax on night  of study

## 2016-02-04 NOTE — Progress Notes (Signed)
   Subjective:    Patient ID: Joshua Brown, male    DOB: April 22, 1946, 70 y.o.   MRN: [id number]  HPI  70 year old man presents for FU of sleep disordered breathing.  PMH- Premature CAD, hyperlipidemia, Barrett's esophagus, CABG 1989  02/04/2016  Chief Complaint  Patient presents with  . Sleep Apnea    does not wear CPAP machine.  Wakes up 3 times nightly, snoring, witnessed apneas.       Last seen in 2015 when he was advised CPAP therapy but he did not follow through Due to severe anxiety. He feels like he is doing better on this front and is ready for sleep evaluation  He continues to have loud snoring and excessive nocturia. He also reports occasional gasping and choking episodes that wake him up from sleep. He takes Xanax as needed at bedtime if his anxiety severe and this seems to control his symptoms  Significant tests/ events  2011- PSG-191 lbs- AHI 17/h -predom with supine REM  Review of Systems neg for any significant sore throat, dysphagia, itching, sneezing, nasal congestion or excess/ purulent secretions, fever, chills, sweats, unintended wt loss, pleuritic or exertional cp, hempoptysis, orthopnea pnd or change in chronic leg swelling. Also denies presyncope, palpitations, heartburn, abdominal pain, nausea, vomiting, diarrhea or change in bowel or urinary habits, dysuria,hematuria, rash, arthralgias, visual complaints, headache, numbness weakness or ataxia.     Objective:   Physical Exam   Gen. Pleasant, obese,  ENT - no lesions, no post nasal drip Neck: No JVD, no thyromegaly, no carotid bruits Lungs: no use of accessory muscles, no dullness to percussion, decreased without rales or rhonchi  Cardiovascular: Rhythm regular, heart sounds  normal, no murmurs or gallops, no peripheral edema Musculoskeletal: No deformities, no cyanosis or clubbing , no tremors        Assessment & Plan:

## 2016-02-04 NOTE — Patient Instructions (Signed)
Home sleep study 

## 2016-02-16 DIAGNOSIS — G4733 Obstructive sleep apnea (adult) (pediatric): Secondary | ICD-10-CM | POA: Diagnosis not present

## 2016-02-17 ENCOUNTER — Other Ambulatory Visit (HOSPITAL_COMMUNITY): Payer: Medicare Other

## 2016-02-17 ENCOUNTER — Telehealth: Payer: Self-pay | Admitting: Cardiovascular Disease

## 2016-02-17 ENCOUNTER — Ambulatory Visit (HOSPITAL_COMMUNITY)
Admission: RE | Admit: 2016-02-17 | Discharge: 2016-02-17 | Disposition: A | Discharge status: Home / Self Care | Payer: Medicare Other | Source: Ambulatory Visit | Attending: Cardiovascular Disease | Admitting: Cardiovascular Disease

## 2016-02-17 DIAGNOSIS — I2510 Atherosclerotic heart disease of native coronary artery without angina pectoris: Secondary | ICD-10-CM | POA: Insufficient documentation

## 2016-02-17 DIAGNOSIS — I700 Atherosclerosis of aorta: Secondary | ICD-10-CM | POA: Diagnosis not present

## 2016-02-17 DIAGNOSIS — K219 Gastro-esophageal reflux disease without esophagitis: Secondary | ICD-10-CM | POA: Insufficient documentation

## 2016-02-17 DIAGNOSIS — I708 Atherosclerosis of other arteries: Secondary | ICD-10-CM | POA: Diagnosis not present

## 2016-02-17 DIAGNOSIS — E785 Hyperlipidemia, unspecified: Secondary | ICD-10-CM | POA: Diagnosis not present

## 2016-02-17 DIAGNOSIS — I10 Essential (primary) hypertension: Secondary | ICD-10-CM | POA: Insufficient documentation

## 2016-02-17 DIAGNOSIS — I739 Peripheral vascular disease, unspecified: Secondary | ICD-10-CM | POA: Insufficient documentation

## 2016-02-17 NOTE — Telephone Encounter (Signed)
Request for surgical clearance:  1. What type of surgery is being performed? Tooth extracted surgically on Thursday-02-19-16-need this asap   2. When is this surgery scheduled?02-19-16   3. Are there any medications that need to be held prior to surgery and how long?Can he stop Plavix? If so how many days does he need to stop prior to Thursday  4. Name of physician performing surgery? Dr Malachy Chamber   5. What is your office phone and fax number?[phone] and fax is [phone] do this asap

## 2016-02-18 NOTE — Telephone Encounter (Signed)
Fine to hold plavix 3-4 days if needed.

## 2016-02-18 NOTE — Telephone Encounter (Signed)
Follow Up:    Need clearance asap,scheduled for extraction tomorrow. She needs this now please.

## 2016-02-18 NOTE — Telephone Encounter (Signed)
Lattie Haw from Dr [facility] office calling to follow-up with Dr Burt Knack and RN, to see about pt being cleared from Plavix, prior to his scheduled oral surgery to have his left lower wisdom tooth extracted tomorrow.  Per Lattie Haw, the pt called their office on Monday stating that he had a "dangling tooth in the back of his mouth," and wanted to know if Dr Docia Barrier could remove this, this week, for the pt plans to go out of town all next week.  Per Lattie Haw, they had a cancellation on their schedule for this Thursday, and placed the pt in that spot.  Lattie Haw greatly apologizes for the short clearance notice, for this was strictly a work-in for the pt, to have this removed before he goes out of the state. Informed Lattie Haw that Dr Burt Knack and RN are both out of the office today, but I will most certainly route this message to them both, and if Dr Burt Knack responds to this message today, I will promptly follow-up with her shortly thereafter.  Informed Lattie Haw that if she hasn't heard back from our office today, consideration of rescheduling the pts extraction procedure, may need to be considered for pts safety.  Lattie Haw verbalized understanding and agrees with this plan.

## 2016-02-19 ENCOUNTER — Telehealth: Payer: Self-pay | Admitting: Pulmonary Disease

## 2016-02-19 DIAGNOSIS — G4733 Obstructive sleep apnea (adult) (pediatric): Secondary | ICD-10-CM | POA: Diagnosis not present

## 2016-02-19 NOTE — Telephone Encounter (Signed)
I spoke with Joshua Brown and made her aware that per Dr Burt Knack the pt is okay to hold plavix 3-4 days prior to extraction if needed.  But if Dr Docia Barrier is okay with doing one extraction while the pt is on plavix then it is not required that the pt stop this medication. I will fax this note to [facility] for Dr Docia Barrier to review.

## 2016-02-19 NOTE — Telephone Encounter (Signed)
Patient notified of Sleep Study results.  Order for CPAP placed.   Patient will be out of town until 03/08/16.  Patient will call our office to schedule follow up appointment for 6 weeks after CPAP has been set up.  Ok to see NP for follow up. Nothing further needed.

## 2016-02-19 NOTE — Telephone Encounter (Signed)
Per Dr. Elsworth Soho:  Sleep study done 02/16/16 shows AHI: 21.7/hr; lowest Sat 80%.  Needs Auto CPAP 5-15cm, nasal pillows, download x 4 weeks, follow up in 6 weeks (can see NP for follow up) --------------- Attempted to contact patient, left message to call back.

## 2016-02-20 ENCOUNTER — Other Ambulatory Visit: Payer: Self-pay | Admitting: *Deleted

## 2016-02-20 DIAGNOSIS — G4733 Obstructive sleep apnea (adult) (pediatric): Secondary | ICD-10-CM

## 2016-03-15 ENCOUNTER — Other Ambulatory Visit: Payer: Self-pay | Admitting: Cardiovascular Disease

## 2016-04-18 DIAGNOSIS — Z23 Encounter for immunization: Secondary | ICD-10-CM | POA: Diagnosis not present

## 2016-04-22 ENCOUNTER — Other Ambulatory Visit: Payer: Self-pay | Admitting: Cardiovascular Disease

## 2016-04-26 ENCOUNTER — Other Ambulatory Visit: Payer: Self-pay | Admitting: Cardiovascular Disease

## 2016-04-26 DIAGNOSIS — Z0100 Encounter for examination of eyes and vision without abnormal findings: Secondary | ICD-10-CM | POA: Diagnosis not present

## 2016-05-10 ENCOUNTER — Ambulatory Visit (INDEPENDENT_AMBULATORY_CARE_PROVIDER_SITE_OTHER): Payer: Medicare Other | Admitting: Pulmonary Disease

## 2016-05-10 ENCOUNTER — Encounter: Payer: Self-pay | Admitting: Pulmonary Disease

## 2016-05-10 DIAGNOSIS — E663 Overweight: Secondary | ICD-10-CM | POA: Diagnosis not present

## 2016-05-10 DIAGNOSIS — I2510 Atherosclerotic heart disease of native coronary artery without angina pectoris: Secondary | ICD-10-CM

## 2016-05-10 DIAGNOSIS — G4733 Obstructive sleep apnea (adult) (pediatric): Secondary | ICD-10-CM | POA: Diagnosis not present

## 2016-05-10 NOTE — Patient Instructions (Signed)
You are doing well on auto CPAP settings Change to fixed pressure 11 cm  Call us for any problems

## 2016-05-10 NOTE — Assessment & Plan Note (Signed)
You are doing well on auto CPAP settings Change to fixed pressure 11 cm  Weight loss encouraged, compliance with goal of at least 4-6 hrs every night is the expectation. Advised against medications with sedative side effects Cautioned against driving when sleepy - understanding that sleepiness will vary on a day to day basis

## 2016-05-10 NOTE — Progress Notes (Signed)
   Subjective:    Patient ID: Joshua Brown, male    DOB: August 26, 1945, 70 y.o.   MRN: [id number]  HPI  70 year old man presents for FU of sleep disordered breathing.  PMH- Premature CAD, hyperlipidemia, Barrett's esophagus, CABG 1989  05/10/2016  Chief Complaint  Patient presents with  . Follow-up    Pt. usues a CPAP, Pt. states he is doing well, with no complaints today DME: [facility]   He was started on Auto CPAP 5-15cm, nasal pillows  He has tolerated this very well with improvement in his daytime somnolence and fatigue. He seems to rest better at night No problems with mask or pressure-no nasal stuffiness  Download shows good control of events with residual AHI 3/hour with average pressure of 11 cm and minimal leak      his nocturia is also decreased Significant tests/ events  2011- PSG-191 lbs- AHI 17/h -predom with supine REM  02/2016 HST >  AHI: 21.7/hr; lowest Sat 80%.      Review of Systems Patient denies significant dyspnea,cough, hemoptysis,  chest pain, palpitations, pedal edema, orthopnea, paroxysmal nocturnal dyspnea, lightheadedness, nausea, vomiting, abdominal or  leg pains      Objective:   Physical Exam   Gen. Pleasant, obese, in no distress ENT - no lesions, no post nasal drip Neck: No JVD, no thyromegaly, no carotid bruits Lungs: no use of accessory muscles, no dullness to percussion, decreased without rales or rhonchi  Cardiovascular: Rhythm regular, heart sounds  normal, no murmurs or gallops, no peripheral edema Musculoskeletal: No deformities, no cyanosis or clubbing , no tremors        Assessment & Plan:

## 2016-05-10 NOTE — Assessment & Plan Note (Signed)
Weight loss encouraged 

## 2016-05-24 ENCOUNTER — Encounter: Payer: Self-pay | Admitting: Pulmonary Disease

## 2016-06-08 ENCOUNTER — Encounter: Payer: Self-pay | Admitting: Internal Medicine

## 2016-06-08 ENCOUNTER — Ambulatory Visit (INDEPENDENT_AMBULATORY_CARE_PROVIDER_SITE_OTHER): Payer: Medicare Other | Admitting: Internal Medicine

## 2016-06-08 DIAGNOSIS — I10 Essential (primary) hypertension: Secondary | ICD-10-CM | POA: Diagnosis not present

## 2016-06-08 DIAGNOSIS — R739 Hyperglycemia, unspecified: Secondary | ICD-10-CM | POA: Diagnosis not present

## 2016-06-08 DIAGNOSIS — I2583 Coronary atherosclerosis due to lipid rich plaque: Secondary | ICD-10-CM | POA: Diagnosis not present

## 2016-06-08 DIAGNOSIS — E785 Hyperlipidemia, unspecified: Secondary | ICD-10-CM | POA: Diagnosis not present

## 2016-06-08 DIAGNOSIS — I2510 Atherosclerotic heart disease of native coronary artery without angina pectoris: Secondary | ICD-10-CM | POA: Diagnosis not present

## 2016-06-08 DIAGNOSIS — E291 Testicular hypofunction: Secondary | ICD-10-CM

## 2016-06-08 NOTE — Assessment & Plan Note (Signed)
Toprol Losartan NAS diet

## 2016-06-08 NOTE — Progress Notes (Signed)
Subjective:  Patient ID: Joshua Brown, male    DOB: [birth year]  Age: 70 y.o. MRN: [id number]  CC: No chief complaint on file.   HPI Joshua Brown presents for OSA, anxiety, GERD, HTN f/u Better on CPAP  Outpatient Medications Prior to Visit  Medication Sig Dispense Refill  . ALPRAZolam (XANAX) 0.5 MG tablet Take 1 tablet (0.5 mg total) by mouth at bedtime as needed for anxiety. 90 tablet 3  . atorvastatin (LIPITOR) 80 MG tablet Take 1 tablet by mouth  daily 90 tablet 3  . b complex vitamins capsule Take 1 capsule by mouth daily.      . Cholecalciferol (VITAMIN D) 2000 UNIT CAPS Take 2,000 capsules by mouth daily.      . clopidogrel (PLAVIX) 75 MG tablet TAKE 1 TABLET BY MOUTH  EVERY DAY 90 tablet 2  . losartan (COZAAR) 25 MG tablet TAKE 1 TABLET BY MOUTH  DAILY 90 tablet 2  . metoprolol succinate (TOPROL-XL) 50 MG 24 hr tablet TAKE 1 TABLET BY MOUTH  DAILY WITH OR IMMEDIATELY  FOLLOWING A MEAL 90 tablet 2  . niacin (NIASPAN) 500 MG CR tablet Take 2,000 mg by mouth at bedtime.      . nitroGLYCERIN (NITROSTAT) 0.4 MG SL tablet Place 1 tablet (0.4 mg total) under the tongue every 5 (five) minutes as needed for chest pain. 20 tablet 5  . nortriptyline (PAMELOR) 10 MG capsule Take 1 capsule (10 mg total) by mouth at bedtime. 30 capsule 5  . pantoprazole (PROTONIX) 40 MG tablet Take 1 tablet by mouth  daily 90 tablet 3  . ZETIA 10 MG tablet TAKE 1 TABLET BY MOUTH  DAILY 90 tablet 2   No facility-administered medications prior to visit.     ROS Review of Systems  Constitutional: Negative for appetite change, fatigue and unexpected weight change.  HENT: Negative for congestion, nosebleeds, sneezing, sore throat and trouble swallowing.   Eyes: Negative for itching and visual disturbance.  Respiratory: Negative for cough.   Cardiovascular: Negative for chest pain, palpitations and leg swelling.  Gastrointestinal: Negative for abdominal distention, blood in stool, diarrhea and nausea.    Genitourinary: Negative for frequency and hematuria.  Musculoskeletal: Negative for back pain, gait problem, joint swelling and neck pain.  Skin: Negative for rash.  Neurological: Negative for dizziness, tremors, speech difficulty and weakness.  Psychiatric/Behavioral: Negative for agitation, dysphoric mood and sleep disturbance. The patient is nervous/anxious.     Objective:  BP 140/80   Pulse 70   Wt 192 lb (87.1 kg)   SpO2 97%   BMI 30.07 kg/m   BP Readings from Last 3 Encounters:  06/08/16 140/80  05/10/16 132/72  02/04/16 118/80    Wt Readings from Last 3 Encounters:  06/08/16 192 lb (87.1 kg)  05/10/16 192 lb 6.4 oz (87.3 kg)  02/04/16 191 lb (86.6 kg)    Physical Exam  Constitutional: He is oriented to person, place, and time. He appears well-developed. No distress.  NAD  HENT:  Mouth/Throat: Oropharynx is clear and moist.  Eyes: Conjunctivae are normal. Pupils are equal, round, and reactive to light.  Neck: Normal range of motion. No JVD present. No thyromegaly present.  Cardiovascular: Normal rate, regular rhythm, normal heart sounds and intact distal pulses.  Exam reveals no gallop and no friction rub.   No murmur heard. Pulmonary/Chest: Effort normal and breath sounds normal. No respiratory distress. He has no wheezes. He has no rales. He exhibits no tenderness.  Abdominal:  Soft. Bowel sounds are normal. He exhibits no distension and no mass. There is no tenderness. There is no rebound and no guarding.  Musculoskeletal: Normal range of motion. He exhibits no edema or tenderness.  Lymphadenopathy:    He has no cervical adenopathy.  Neurological: He is alert and oriented to person, place, and time. He has normal reflexes. No cranial nerve deficit. He exhibits normal muscle tone. He displays a negative Romberg sign. Coordination and gait normal.  Skin: Skin is warm and dry. No rash noted.  Psychiatric: He has a normal mood and affect. His behavior is normal.  Judgment and thought content normal.    Lab Results  Component Value Date   WBC 9.9 12/08/2015   HGB 15.8 12/08/2015   HCT 48.2 12/08/2015   PLT 167.0 12/08/2015   GLUCOSE 113 (H) 12/08/2015   CHOL 162 12/08/2015   TRIG 145.0 12/08/2015   HDL 43.60 12/08/2015   LDLCALC 89 12/08/2015   ALT 34 12/08/2015   AST 31 12/08/2015   NA 140 12/08/2015   K 4.5 12/08/2015   CL 104 12/08/2015   CREATININE 1.05 12/08/2015   BUN 12 12/08/2015   CO2 27 12/08/2015   TSH 0.99 12/08/2015   PSA 0.44 12/08/2015   INR 1.1 RATIO (H) 05/16/2008   HGBA1C 6.5 11/27/2014    No results found.  Assessment & Plan:   There are no diagnoses linked to this encounter. I am having Mr. Sirman maintain his niacin, Vitamin D, b complex vitamins, ALPRAZolam, nitroGLYCERIN, nortriptyline, atorvastatin, pantoprazole, metoprolol succinate, losartan, ZETIA, and clopidogrel.  No orders of the defined types were placed in this encounter.    Follow-up: No Follow-up on file.  Walker Kehr, MD

## 2016-06-08 NOTE — Assessment & Plan Note (Signed)
Labs

## 2016-06-08 NOTE — Assessment & Plan Note (Signed)
See diet 

## 2016-06-08 NOTE — Progress Notes (Signed)
Pre visit review using our clinic review tool, if applicable. No additional management support is needed unless otherwise documented below in the visit note. 

## 2016-06-08 NOTE — Assessment & Plan Note (Signed)
Lipitor, Plavix, Losartan, Toprol, Zetia Niacin NTG prn - never took it 

## 2016-06-08 NOTE — Assessment & Plan Note (Signed)
Zetia, Lipitor, Niacin 

## 2016-06-08 NOTE — Patient Instructions (Signed)

## 2016-07-06 DIAGNOSIS — G4733 Obstructive sleep apnea (adult) (pediatric): Secondary | ICD-10-CM | POA: Diagnosis not present

## 2016-07-13 ENCOUNTER — Other Ambulatory Visit: Payer: Self-pay | Admitting: *Deleted

## 2016-07-13 MED ORDER — METOPROLOL SUCCINATE ER 50 MG PO TB24: ORAL_TABLET | ORAL | 1 refills | Status: DC

## 2016-07-13 MED ORDER — EZETIMIBE 10 MG PO TABS: 10.0000 mg | ORAL_TABLET | Freq: Every day | ORAL | 1 refills | Status: DC

## 2016-07-13 MED ORDER — ATORVASTATIN CALCIUM 80 MG PO TABS: 80.0000 mg | ORAL_TABLET | Freq: Every day | ORAL | 1 refills | Status: DC

## 2016-07-13 MED ORDER — PANTOPRAZOLE SODIUM 40 MG PO TBEC: 40.0000 mg | DELAYED_RELEASE_TABLET | Freq: Every day | ORAL | 1 refills | Status: DC

## 2016-07-13 MED ORDER — CLOPIDOGREL BISULFATE 75 MG PO TABS: 75.0000 mg | ORAL_TABLET | Freq: Every day | ORAL | 1 refills | Status: DC

## 2016-07-13 MED ORDER — LOSARTAN POTASSIUM 25 MG PO TABS: 25.0000 mg | ORAL_TABLET | Freq: Every day | ORAL | 1 refills | Status: DC

## 2016-07-30 DIAGNOSIS — G4733 Obstructive sleep apnea (adult) (pediatric): Secondary | ICD-10-CM | POA: Diagnosis not present

## 2016-08-09 DIAGNOSIS — L089 Local infection of the skin and subcutaneous tissue, unspecified: Secondary | ICD-10-CM | POA: Diagnosis not present

## 2016-08-09 DIAGNOSIS — B353 Tinea pedis: Secondary | ICD-10-CM | POA: Diagnosis not present

## 2016-08-09 DIAGNOSIS — L409 Psoriasis, unspecified: Secondary | ICD-10-CM | POA: Diagnosis not present

## 2016-08-09 DIAGNOSIS — L570 Actinic keratosis: Secondary | ICD-10-CM | POA: Diagnosis not present

## 2016-08-30 DIAGNOSIS — G4733 Obstructive sleep apnea (adult) (pediatric): Secondary | ICD-10-CM | POA: Diagnosis not present

## 2016-09-07 DIAGNOSIS — L821 Other seborrheic keratosis: Secondary | ICD-10-CM | POA: Diagnosis not present

## 2016-09-07 DIAGNOSIS — B354 Tinea corporis: Secondary | ICD-10-CM | POA: Diagnosis not present

## 2016-12-10 ENCOUNTER — Other Ambulatory Visit (INDEPENDENT_AMBULATORY_CARE_PROVIDER_SITE_OTHER): Payer: PPO

## 2016-12-10 ENCOUNTER — Ambulatory Visit (INDEPENDENT_AMBULATORY_CARE_PROVIDER_SITE_OTHER): Payer: PPO | Admitting: Internal Medicine

## 2016-12-10 ENCOUNTER — Encounter: Payer: Self-pay | Admitting: Internal Medicine

## 2016-12-10 VITALS — BP 120/76 | HR 67 | Temp 98.6°F | Ht 67.0 in | Wt 192.0 lb

## 2016-12-10 DIAGNOSIS — R7309 Other abnormal glucose: Secondary | ICD-10-CM

## 2016-12-10 DIAGNOSIS — E291 Testicular hypofunction: Secondary | ICD-10-CM

## 2016-12-10 DIAGNOSIS — Z0000 Encounter for general adult medical examination without abnormal findings: Secondary | ICD-10-CM

## 2016-12-10 DIAGNOSIS — L570 Actinic keratosis: Secondary | ICD-10-CM

## 2016-12-10 DIAGNOSIS — I2583 Coronary atherosclerosis due to lipid rich plaque: Secondary | ICD-10-CM

## 2016-12-10 DIAGNOSIS — I2510 Atherosclerotic heart disease of native coronary artery without angina pectoris: Secondary | ICD-10-CM | POA: Diagnosis not present

## 2016-12-10 DIAGNOSIS — E785 Hyperlipidemia, unspecified: Secondary | ICD-10-CM | POA: Diagnosis not present

## 2016-12-10 DIAGNOSIS — I10 Essential (primary) hypertension: Secondary | ICD-10-CM | POA: Diagnosis not present

## 2016-12-10 DIAGNOSIS — Z0001 Encounter for general adult medical examination with abnormal findings: Secondary | ICD-10-CM

## 2016-12-10 DIAGNOSIS — N320 Bladder-neck obstruction: Secondary | ICD-10-CM

## 2016-12-10 LAB — TESTOSTERONE: Testosterone: 188.65 ng/dL — ABNORMAL LOW (ref 300.00–890.00)

## 2016-12-10 LAB — HEPATIC FUNCTION PANEL
ALT: 26 U/L (ref 0–53)
AST: 25 U/L (ref 0–37)
Albumin: 4.6 g/dL (ref 3.5–5.2)
Alkaline Phosphatase: 74 U/L (ref 39–117)
BILIRUBIN DIRECT: 0.1 mg/dL (ref 0.0–0.3)
TOTAL PROTEIN: 7 g/dL (ref 6.0–8.3)
Total Bilirubin: 0.5 mg/dL (ref 0.2–1.2)

## 2016-12-10 LAB — TSH: TSH: 1.53 u[IU]/mL (ref 0.35–4.50)

## 2016-12-10 LAB — CBC WITH DIFFERENTIAL/PLATELET
BASOS ABS: 0 10*3/uL (ref 0.0–0.1)
Basophils Relative: 0.4 % (ref 0.0–3.0)
EOS ABS: 0.2 10*3/uL (ref 0.0–0.7)
Eosinophils Relative: 2.4 % (ref 0.0–5.0)
HEMATOCRIT: 46.3 % (ref 39.0–52.0)
Hemoglobin: 15.6 g/dL (ref 13.0–17.0)
LYMPHS PCT: 28.4 % (ref 12.0–46.0)
Lymphs Abs: 2.6 10*3/uL (ref 0.7–4.0)
MCHC: 33.6 g/dL (ref 30.0–36.0)
MCV: 87 fl (ref 78.0–100.0)
MONOS PCT: 6 % (ref 3.0–12.0)
Monocytes Absolute: 0.5 10*3/uL (ref 0.1–1.0)
Neutro Abs: 5.7 10*3/uL (ref 1.4–7.7)
Neutrophils Relative %: 62.8 % (ref 43.0–77.0)
PLATELETS: 138 10*3/uL — AB (ref 150.0–400.0)
RBC: 5.32 Mil/uL (ref 4.22–5.81)
RDW: 14.2 % (ref 11.5–15.5)
WBC: 9.1 10*3/uL (ref 4.0–10.5)

## 2016-12-10 LAB — URINALYSIS
BILIRUBIN URINE: NEGATIVE
Hgb urine dipstick: NEGATIVE
KETONES UR: NEGATIVE
Leukocytes, UA: NEGATIVE
Nitrite: NEGATIVE
PH: 6.5 (ref 5.0–8.0)
Specific Gravity, Urine: 1.01 (ref 1.000–1.030)
TOTAL PROTEIN, URINE-UPE24: NEGATIVE
URINE GLUCOSE: NEGATIVE
Urobilinogen, UA: 0.2 (ref 0.0–1.0)

## 2016-12-10 LAB — BASIC METABOLIC PANEL
BUN: 17 mg/dL (ref 6–23)
CHLORIDE: 103 meq/L (ref 96–112)
CO2: 31 meq/L (ref 19–32)
Calcium: 9.5 mg/dL (ref 8.4–10.5)
Creatinine, Ser: 1.13 mg/dL (ref 0.40–1.50)
GFR: 67.77 mL/min (ref >60.00)
Glucose, Bld: 132 mg/dL — ABNORMAL HIGH (ref 70–99)
POTASSIUM: 4.8 meq/L (ref 3.5–5.1)
Sodium: 138 mEq/L (ref 135–145)

## 2016-12-10 LAB — LIPID PANEL
CHOL/HDL RATIO: 3
CHOLESTEROL: 128 mg/dL (ref 0–200)
HDL: 37.6 mg/dL — ABNORMAL LOW (ref >39.00)
LDL CALC: 73 mg/dL (ref 0–99)
NonHDL: 90.49
Triglycerides: 86 mg/dL (ref 0.0–149.0)
VLDL: 17.2 mg/dL (ref 0.0–40.0)

## 2016-12-10 LAB — PSA: PSA: 0.49 ng/mL (ref 0.10–4.00)

## 2016-12-10 LAB — HEMOGLOBIN A1C: Hgb A1c MFr Bld: 6.7 % — ABNORMAL HIGH (ref 4.6–6.5)

## 2016-12-10 MED ORDER — ZOSTER VAC RECOMB ADJUVANTED 50 MCG/0.5ML IM SUSR: 0.5000 mL | Freq: Once | INTRAMUSCULAR | 1 refills | Status: AC

## 2016-12-10 NOTE — Assessment & Plan Note (Signed)
PSA

## 2016-12-10 NOTE — Patient Instructions (Addendum)
Postprocedure instructions :     Keep the wounds clean. You can wash them with liquid soap and water. Pat dry with gauze or a Kleenex tissue  Before applying antibiotic ointment and a Band-Aid.   You need to report immediately  if  any signs of infection develop.    There are natural ways to boost your testosterone:  1. Lose Weight If you're overweight, shedding the excess pounds may increase your testosterone levels, according to multiple research. Overweight men are more likely to have low testosterone levels to begin with, so this is an important trick to increase your body's testosterone production when you need it most.   2. Strength Training    Strength training is also known to boost testosterone levels, provided you are doing so intensely enough. When strength training to boost testosterone, you'll want to increase the weight and lower your number of reps, and then focus on exercises that work a large number of muscles.  3. Optimize Your Vitamin D Levels Vitamin D, a steroid hormone, is essential for the healthy development of the nucleus of the sperm cell, and helps maintain semen quality and sperm count. Vitamin D also increases levels of testosterone, which may boost libido. In one study, overweight men who were given vitamin D supplements had a significant increase in testosterone levels after one year.  4. Reduce Stress When you're under a lot of stress, your body releases high levels of the stress hormone cortisol. This hormone actually blocks the effects of testosterone, presumably because, from a biological standpoint, testosterone-associated behaviors (mating, competing, aggression) may have lowered your chances of survival in an emergency (hence, the "fight or flight" response is dominant, courtesy of cortisol).  5. Limit or Eliminate Sugar from Your Diet Testosterone levels decrease after you eat sugar, which is likely because the sugar leads to a high insulin level,  another factor leading to low testosterone.  6. Eat Healthy Fats By healthy, this means not only mon- and polyunsaturated fats, like that found in avocadoes and nuts, but also saturated, as these are essential for building testosterone. Research shows that a diet with less than 40 percent of energy as fat (and that mainly from animal sources, i.e. saturated) lead to a decrease in testosterone levels.  It's important to understand that your body requires saturated fats from animal and vegetable sources (such as meat, dairy, certain oils, and tropical plants like coconut) for optimal functioning, and if you neglect this important food group in favor of sugar, grains and other starchy carbs, your health and weight are almost guaranteed to suffer. Examples of healthy fats you can eat more of to give your testosterone levels a boost include:  Olives and Olive oil  Coconuts and coconut oil Butter made from organic milk  Raw nuts, such as, almonds or pecans Eggs Avocados   Meats Palm oil Unheated organic nut oils   7. "Testosterone boosters" containing Vitamin D-3, Niacin, Vitamin B-6, Vitamin B-12, Magnesium, Zinc, Selenium, D-Aspartic Acid, Fenugreed Seed Extract, Oystershell, [place] Extract, [place] Ginseng may be helpful as well.   Health Maintenance, Male A healthy lifestyle and preventive care is important for your health and wellness. Ask your health care provider about what schedule of regular examinations is right for you. What should I know about weight and diet? Eat a Healthy Diet  Eat plenty of vegetables, fruits, whole grains, low-fat dairy products, and lean protein.  Do not eat a lot of foods high in solid fats, added sugars, or  salt.  Maintain a Healthy Weight Regular exercise can help you achieve or maintain a healthy weight. You should:  Do at least 150 minutes of exercise each week. The exercise should increase your heart rate and make you sweat (moderate-intensity  exercise).  Do strength-training exercises at least twice a week.  Watch Your Levels of Cholesterol and Blood Lipids  Have your blood tested for lipids and cholesterol every 5 years starting at 72 years of age. If you are at high risk for heart disease, you should start having your blood tested when you are 72 years old. You may need to have your cholesterol levels checked more often if: ? Your lipid or cholesterol levels are high. ? You are older than 72 years of age. ? You are at high risk for heart disease.  What should I know about cancer screening? Many types of cancers can be detected early and may often be prevented. Lung Cancer  You should be screened every year for lung cancer if: ? You are a current smoker who has smoked for at least 30 years. ? You are a former smoker who has quit within the past 15 years.  Talk to your health care provider about your screening options, when you should start screening, and how often you should be screened.  Colorectal Cancer  Routine colorectal cancer screening usually begins at 72 years of age and should be repeated every 5-10 years until you are 72 years old. You may need to be screened more often if early forms of precancerous polyps or small growths are found. Your health care provider may recommend screening at an earlier age if you have risk factors for colon cancer.  Your health care provider may recommend using home test kits to check for hidden blood in the stool.  A small camera at the end of a tube can be used to examine your colon (sigmoidoscopy or colonoscopy). This checks for the earliest forms of colorectal cancer.  Prostate and Testicular Cancer  Depending on your age and overall health, your health care provider may do certain tests to screen for prostate and testicular cancer.  Talk to your health care provider about any symptoms or concerns you have about testicular or prostate cancer.  Skin Cancer  Check your skin  from head to toe regularly.  Tell your health care provider about any new moles or changes in moles, especially if: ? There is a change in a mole's size, shape, or color. ? You have a mole that is larger than a pencil eraser.  Always use sunscreen. Apply sunscreen liberally and repeat throughout the day.  Protect yourself by wearing long sleeves, pants, a wide-brimmed hat, and sunglasses when outside.  What should I know about heart disease, diabetes, and high blood pressure?  If you are 95-37 years of age, have your blood pressure checked every 3-5 years. If you are 79 years of age or older, have your blood pressure checked every year. You should have your blood pressure measured twice-once when you are at a hospital or clinic, and once when you are not at a hospital or clinic. Record the average of the two measurements. To check your blood pressure when you are not at a hospital or clinic, you can use: ? An automated blood pressure machine at a pharmacy. ? A home blood pressure monitor.  Talk to your health care provider about your target blood pressure.  If you are between 63-2 years old, ask your health care  provider if you should take aspirin to prevent heart disease.  Have regular diabetes screenings by checking your fasting blood sugar level. ? If you are at a normal weight and have a low risk for diabetes, have this test once every three years after the age of 88. ? If you are overweight and have a high risk for diabetes, consider being tested at a younger age or more often.  A one-time screening for abdominal aortic aneurysm (AAA) by ultrasound is recommended for men aged 76-75 years who are current or former smokers. What should I know about preventing infection? Hepatitis B If you have a higher risk for hepatitis B, you should be screened for this virus. Talk with your health care provider to find out if you are at risk for hepatitis B infection. Hepatitis C Blood testing is  recommended for:  Everyone born from [birth year] through 1966.  Anyone with known risk factors for hepatitis C.  Sexually Transmitted Diseases (STDs)  You should be screened each year for STDs including gonorrhea and chlamydia if: ? You are sexually active and are younger than 72 years of age. ? You are older than 72 years of age and your health care provider tells you that you are at risk for this type of infection. ? Your sexual activity has changed since you were last screened and you are at an increased risk for chlamydia or gonorrhea. Ask your health care provider if you are at risk.  Talk with your health care provider about whether you are at high risk of being infected with HIV. Your health care provider may recommend a prescription medicine to help prevent HIV infection.  What else can I do?  Schedule regular health, dental, and eye exams.  Stay current with your vaccines (immunizations).  Do not use any tobacco products, such as cigarettes, chewing tobacco, and e-cigarettes. If you need help quitting, ask your health care provider.  Limit alcohol intake to no more than 2 drinks per day. One drink equals 12 ounces of beer, 5 ounces of wine, or 1 ounces of hard liquor.  Do not use street drugs.  Do not share needles.  Ask your health care provider for help if you need support or information about quitting drugs.  Tell your health care provider if you often feel depressed.  Tell your health care provider if you have ever been abused or do not feel safe at home. This information is not intended to replace advice given to you by your health care provider. Make sure you discuss any questions you have with your health care provider. Document Released: 12/18/2007 Document Revised: 02/18/2016 Document Reviewed: 03/25/2015 Elsevier Interactive Patient Education  [employer].

## 2016-12-10 NOTE — Assessment & Plan Note (Signed)
  Here for medicare wellness/physical  Diet: heart healthy  Physical activity: not sedentary  Depression/mood screen: negative  Hearing: intact to whispered voice  Visual acuity: grossly normal w/glasses, performs annual eye exam  ADLs: capable  Fall risk: none  Home safety: good  Cognitive evaluation: intact to orientation, naming, recall and repetition  EOL planning: adv directives, full code/ I agree  I have personally reviewed and have noted  1. The patient's medical, surgical and social history  2. Their use of alcohol, tobacco or illicit drugs  3. Their current medications and supplements  4. The patient's functional ability including ADL's, fall risks, home safety risks and hearing or visual impairment.  5. Diet and physical activities  6. Evidence for depression or mood disorders 7. The roster of all physicians providing medical care to patient - is listed in the Snapshot section of the chart and reviewed today.    Today patient counseled on age appropriate routine health concerns for screening and prevention, each reviewed and up to date or declined. Immunizations reviewed and up to date or declined. Labs ordered and reviewed. Risk factors for depression reviewed and negative. Hearing function and visual acuity are intact. ADLs screened and addressed as needed. Functional ability and level of safety reviewed and appropriate. Education, counseling and referrals performed based on assessed risks today. Patient provided with a copy of personalized plan for preventive services.   Colon w/Dr Medoff

## 2016-12-10 NOTE — Assessment & Plan Note (Signed)
Zetia, Lipitor, Niacin 

## 2016-12-10 NOTE — Progress Notes (Signed)
Subjective:  Patient ID: Joshua Brown, male    DOB: Nov 28, 1945  Age: 71 y.o. MRN: [id number]  CC: No chief complaint on file.   HPI VINCEN BEJAR presents for a well exam   Outpatient Medications Prior to Visit  Medication Sig Dispense Refill  . ALPRAZolam (XANAX) 0.5 MG tablet Take 1 tablet (0.5 mg total) by mouth at bedtime as needed for anxiety. 90 tablet 3  . atorvastatin (LIPITOR) 80 MG tablet Take 1 tablet (80 mg total) by mouth daily. 90 tablet 1  . b complex vitamins capsule Take 1 capsule by mouth daily.      . Cholecalciferol (VITAMIN D) 2000 UNIT CAPS Take 2,000 capsules by mouth daily.      . clopidogrel (PLAVIX) 75 MG tablet Take 1 tablet (75 mg total) by mouth daily. 90 tablet 1  . ezetimibe (ZETIA) 10 MG tablet Take 1 tablet (10 mg total) by mouth daily. 90 tablet 1  . losartan (COZAAR) 25 MG tablet Take 1 tablet (25 mg total) by mouth daily. 90 tablet 1  . metoprolol succinate (TOPROL-XL) 50 MG 24 hr tablet TAKE 1 TABLET BY MOUTH  DAILY WITH OR IMMEDIATELY  FOLLOWING A MEAL 90 tablet 1  . niacin (NIASPAN) 500 MG CR tablet Take 2,000 mg by mouth at bedtime.      . nitroGLYCERIN (NITROSTAT) 0.4 MG SL tablet Place 1 tablet (0.4 mg total) under the tongue every 5 (five) minutes as needed for chest pain. 20 tablet 5  . pantoprazole (PROTONIX) 40 MG tablet Take 1 tablet (40 mg total) by mouth daily. 90 tablet 1  . nortriptyline (PAMELOR) 10 MG capsule Take 1 capsule (10 mg total) by mouth at bedtime. 30 capsule 5   No facility-administered medications prior to visit.     ROS Review of Systems  Constitutional: Negative for appetite change, fatigue and unexpected weight change.  HENT: Negative for congestion, nosebleeds, sneezing, sore throat and trouble swallowing.   Eyes: Negative for itching and visual disturbance.  Respiratory: Negative for cough.   Cardiovascular: Negative for chest pain, palpitations and leg swelling.  Gastrointestinal: Negative for abdominal  distention, blood in stool, diarrhea and nausea.  Genitourinary: Negative for frequency and hematuria.  Musculoskeletal: Negative for back pain, gait problem, joint swelling and neck pain.  Skin: Negative for rash.  Neurological: Negative for dizziness, tremors, speech difficulty and weakness.  Psychiatric/Behavioral: Negative for agitation, dysphoric mood, sleep disturbance and suicidal ideas. The patient is not nervous/anxious.     Objective:  BP 120/76 (BP Location: Left Arm, Patient Position: Sitting, Cuff Size: Large)   Pulse 67   Temp 98.6 F (37 C) (Oral)   Ht 5\' 7"  (1.702 m)   Wt 192 lb (87.1 kg)   SpO2 99%   BMI 30.07 kg/m   BP Readings from Last 3 Encounters:  12/10/16 120/76  06/08/16 140/80  05/10/16 132/72    Wt Readings from Last 3 Encounters:  12/10/16 192 lb (87.1 kg)  06/08/16 192 lb (87.1 kg)  05/10/16 192 lb 6.4 oz (87.3 kg)    Physical Exam  Constitutional: He is oriented to person, place, and time. He appears well-developed. No distress.  NAD  HENT:  Mouth/Throat: Oropharynx is clear and moist.  Eyes: Conjunctivae are normal. Pupils are equal, round, and reactive to light.  Neck: Normal range of motion. No JVD present. No thyromegaly present.  Cardiovascular: Normal rate, regular rhythm, normal heart sounds and intact distal pulses.  Exam reveals no gallop  and no friction rub.   No murmur heard. Pulmonary/Chest: Effort normal and breath sounds normal. No respiratory distress. He has no wheezes. He has no rales. He exhibits no tenderness.  Abdominal: Soft. Bowel sounds are normal. He exhibits no distension and no mass. There is no tenderness. There is no rebound and no guarding.  Musculoskeletal: Normal range of motion. He exhibits no edema or tenderness.  Lymphadenopathy:    He has no cervical adenopathy.  Neurological: He is alert and oriented to person, place, and time. He has normal reflexes. No cranial nerve deficit. He exhibits normal muscle  tone. He displays a negative Romberg sign. Coordination and gait normal.  Skin: Skin is warm and dry. No rash noted.  Psychiatric: He has a normal mood and affect. His behavior is normal. Judgment and thought content normal.   AK on scalp   Procedure Note :     Procedure : Cryosurgery   Indication:  Actinic keratosis(es)   Risks including unsuccessful procedure , bleeding, infection, bruising, scar, a need for a repeat  procedure and others were explained to the patient in detail as well as the benefits. Informed consent was obtained verbally.    1 lesion(s)  on scalp   was/were treated with liquid nitrogen on a Q-tip in a usual fasion . Band-Aid was applied and antibiotic ointment was given for a later use.   Tolerated well. Complications none.   Postprocedure instructions :     Keep the wounds clean. You can wash them with liquid soap and water. Pat dry with gauze or a Kleenex tissue  Before applying antibiotic ointment and a Band-Aid.   You need to report immediately  if  any signs of infection develop.    Lab Results  Component Value Date   WBC 9.9 12/08/2015   HGB 15.8 12/08/2015   HCT 48.2 12/08/2015   PLT 167.0 12/08/2015   GLUCOSE 113 (H) 12/08/2015   CHOL 162 12/08/2015   TRIG 145.0 12/08/2015   HDL 43.60 12/08/2015   LDLCALC 89 12/08/2015   ALT 34 12/08/2015   AST 31 12/08/2015   NA 140 12/08/2015   K 4.5 12/08/2015   CL 104 12/08/2015   CREATININE 1.05 12/08/2015   BUN 12 12/08/2015   CO2 27 12/08/2015   TSH 0.99 12/08/2015   PSA 0.44 12/08/2015   INR 1.1 RATIO (H) 05/16/2008   HGBA1C 6.5 11/27/2014    No results found.  Assessment & Plan:   There are no diagnoses linked to this encounter. I have discontinued Mr. Willcox nortriptyline. I am also having him maintain his niacin, Vitamin D, b complex vitamins, ALPRAZolam, nitroGLYCERIN, losartan, atorvastatin, pantoprazole, ezetimibe, metoprolol succinate, and clopidogrel.  No orders of the defined  types were placed in this encounter.    Follow-up: No Follow-up on file.  Walker Kehr, MD

## 2016-12-10 NOTE — Assessment & Plan Note (Signed)
Losartan, Toprol

## 2016-12-10 NOTE — Assessment & Plan Note (Signed)
See cryo 

## 2016-12-10 NOTE — Assessment & Plan Note (Signed)
Lipitor, Plavix, Losartan, Toprol, Zetia Niacin NTG prn - never took it

## 2016-12-10 NOTE — Assessment & Plan Note (Signed)
Testosterone

## 2016-12-15 ENCOUNTER — Other Ambulatory Visit: Payer: Self-pay | Admitting: Cardiovascular Disease

## 2017-01-21 ENCOUNTER — Encounter: Payer: Self-pay | Admitting: *Deleted

## 2017-01-25 ENCOUNTER — Other Ambulatory Visit: Payer: Self-pay | Admitting: Cardiovascular Disease

## 2017-02-07 ENCOUNTER — Ambulatory Visit (INDEPENDENT_AMBULATORY_CARE_PROVIDER_SITE_OTHER): Payer: PPO | Admitting: Cardiovascular Disease

## 2017-02-07 ENCOUNTER — Encounter: Payer: Self-pay | Admitting: Cardiovascular Disease

## 2017-02-07 VITALS — BP 110/70 | HR 69 | Ht 66.0 in | Wt 189.8 lb

## 2017-02-07 DIAGNOSIS — I700 Atherosclerosis of aorta: Secondary | ICD-10-CM

## 2017-02-07 DIAGNOSIS — I739 Peripheral vascular disease, unspecified: Secondary | ICD-10-CM | POA: Diagnosis not present

## 2017-02-07 DIAGNOSIS — I2510 Atherosclerotic heart disease of native coronary artery without angina pectoris: Secondary | ICD-10-CM | POA: Diagnosis not present

## 2017-02-07 MED ORDER — NITROGLYCERIN 0.4 MG SL SUBL: 0.4000 mg | SUBLINGUAL_TABLET | SUBLINGUAL | 5 refills | Status: DC | PRN

## 2017-02-07 NOTE — Progress Notes (Signed)
Cardiology Office Note Date:  02/07/2017   ID:  Joshua Brown, Joshua Brown 30, 1947, MRN [id number]  PCP:  Cassandria Anger, MD  Cardiologist:  Sherren Mocha, MD    Chief Complaint  Patient presents with  . Follow-up     History of Present Illness: Joshua Brown is a 71 y.o. male who presents for follow-up evaluation.  The patient has a history of premature coronary artery disease with remote myocardial infarction at age 23. He underwent CABG many years ago at age 48. He's also followed for peripheral arterial disease with distal abdominal aortic stenosis. His ABIs have been normal. He's had minor nonobstructive carotid stenosis. His last nuclear stress test was in 2013 when he had complained of chest discomfort. This showed a small, mild basal inferior perfusion defect with reversibility, described as a "low risk study." His LVEF was 57%.   The patient is here alone today. He is doing well without specific cardiovascular complaints. He's had no calf claudication symptoms. He complains of some pain in his left heel with walking but otherwise no lower extremity symptoms. He denies chest pain, chest pressure, or shortness of breath. He has not been engaged in a formal exercise program for in daily walking.  Past Medical History:  Diagnosis Date  . ABDOMINAL AORTIC ANEURYSM 11/12/2008   has annual U/s  . CAD (coronary artery disease)    premature  . DDD (degenerative disc disease)    lumbar spine  . ED (erectile dysfunction)   . Frequent urination at night    nocturia 2-3  . GERD (gastroesophageal reflux disease)   . Hyperglycemia    Borderline  . Hyperlipidemia    Atherogenic  . Hypertension   . Myocardial infarct, old   . Obesity    Class I  . PVD (peripheral vascular disease) ([place])    ABI June '12 -0.9 bilateral.    Past Surgical History:  Procedure Laterality Date  . colonoscopy     one month ago  . CORONARY ARTERY BYPASS GRAFT    . TONSILLECTOMY     age 51     Current Outpatient Prescriptions  Medication Sig Dispense Refill  . ALPRAZolam (XANAX) 0.5 MG tablet Take 1 tablet (0.5 mg total) by mouth at bedtime as needed for anxiety. 90 tablet 3  . atorvastatin (LIPITOR) 80 MG tablet Take 1 tablet (80 mg total) by mouth daily. 90 tablet 1  . b complex vitamins capsule Take 1 capsule by mouth daily.      . Cholecalciferol (VITAMIN D) 2000 UNIT CAPS Take 2,000 Units by mouth daily.     . clopidogrel (PLAVIX) 75 MG tablet TAKE 1 TABLET BY MOUTH EVERY DAY 90 tablet 0  . ezetimibe (ZETIA) 10 MG tablet TAKE 1 TABLET BY MOUTH EVERY DAY 90 tablet 0  . losartan (COZAAR) 25 MG tablet TAKE 1 TABLET BY MOUTH EVERY DAY 90 tablet 0  . metoprolol succinate (TOPROL-XL) 50 MG 24 hr tablet TAKE 1 TABLET BY MOUTH EVERY DAY WITH OR immediately following A MEAL 90 tablet 0  . niacin (NIASPAN) 500 MG CR tablet Take 2,000 mg by mouth at bedtime.      . nitroGLYCERIN (NITROSTAT) 0.4 MG SL tablet Place 1 tablet (0.4 mg total) under the tongue every 5 (five) minutes as needed for chest pain. 25 tablet 5  . pantoprazole (PROTONIX) 40 MG tablet TAKE 1 TABLET BY MOUTH EVERY DAY 90 tablet 0   No current facility-administered medications for this visit.  Allergies:   Patient has no known allergies.   Social History:  The patient  reports that he quit smoking about 47 years ago. His smoking use included Cigarettes. He has a 16.00 pack-year smoking history. He has never used smokeless tobacco. He reports that he drinks about 1.0 oz of alcohol per week . He reports that he does not use drugs.   Family History:  The patient's  family history includes CAD in his father and paternal grandfather; Cancer in his mother; Congestive Heart Failure in his mother; Early death in his father; Heart attack in his father and paternal grandfather; Heart disease in his father and paternal grandfather; Hyperlipidemia in his mother; Prostate cancer in his brother.    ROS:  Please see the history  of present illness.  All other systems are reviewed and negative.    PHYSICAL EXAM: VS:  BP 110/70   Pulse 69   Ht 5\' 6"  (1.676 m)   Wt 189 lb 12.8 oz (86.1 kg)   BMI 30.63 kg/m  , BMI Body mass index is 30.63 kg/m. GEN: Well nourished, well developed, in no acute distress  HEENT: normal  Neck: no JVD, no masses. No carotid bruits Cardiac: RRR without murmur or gallop                Respiratory:  clear to auscultation bilaterally, normal work of breathing GI: soft, nontender, nondistended, + BS MS: no deformity or atrophy  Ext: no pretibial edema, pedal pulses 2+= bilaterally Skin: warm and dry, no rash Neuro:  Strength and sensation are intact Psych: euthymic mood, full affect  EKG:  EKG is ordered today. The ekg ordered today shows NSr 69 bpm, ST-T changes consider inferolateral ischemia (probably nonspecific changes). No change from 12/17/2015 tracing.  Recent Labs: 12/10/2016: ALT 26; BUN 17; Creatinine, Ser 1.13; Hemoglobin 15.6; Platelets 138.0; Potassium 4.8; Sodium 138; TSH 1.53   Lipid Panel     Component Value Date/Time   CHOL 128 12/10/2016 0917   TRIG 86.0 12/10/2016 0917   HDL 37.60 (L) 12/10/2016 0917   CHOLHDL 3 12/10/2016 0917   VLDL 17.2 12/10/2016 0917   LDLCALC 73 12/10/2016 0917      Wt Readings from Last 3 Encounters:  02/07/17 189 lb 12.8 oz (86.1 kg)  12/10/16 192 lb (87.1 kg)  06/08/16 192 lb (87.1 kg)     Cardiac Studies Reviewed: Arterial doppler studies from Aug 2017 reviewed as outlined below.   ASSESSMENT AND PLAN: 1.  Coronary artery disease, native vessel, without symptoms of angina: The patient appears stable on his current medical program. Medications are reviewed and will be continued. This includes antiplatelet therapy, lipid-lowering therapy, I beta blocker, and an ARB.  2. Peripheral arterial disease with intermittent claudication: The patient has been followed for distal abdominal aortic stenosis and bilateral iliac stenosis. The  patient's last Doppler studies are reviewed which demonstrates a distal aortic velocity of 257 cm/s with moderate plaquing and severely elevated iliac velocities just over 3 m/s bilaterally. ABIs are normal bilaterally greater than 0.9.  3. Hypertension: Blood pressure is well controlled on current medications.  4. Hyperlipidemia: Most recent lipids reviewed with total cholesterol 128, LDL cholesterol 73, HDL 38, and triglycerides 86. We discussed initiation of a daily walking program for exercise as well as dietary changes. He understands the positive impact that weight loss and regular exercise would have on his glycemic control, blood pressure, lipids, and testosterone levels.  Current medicines are reviewed with the patient  today.  The patient does not have concerns regarding medicines.  Labs/ tests ordered today include:   Orders Placed This Encounter  Procedures  . EKG 12-Lead    Disposition:   FU one year  Signed, Sherren Mocha, MD  02/07/2017 1:34 PM    [facility] [place], [place], [place]  [address] Phone: [phone]; Fax: [phone]

## 2017-02-07 NOTE — Patient Instructions (Signed)
Medication Instructions:  Your physician recommends that you continue on your current medications as directed. Please refer to the Current Medication list given to you today.  Labwork: No new orders.   Testing/Procedures: Your physician has requested that you have an ankle brachial index (ABI) in 1 YEAR. During this test an ultrasound and blood pressure cuff are used to evaluate the arteries that supply the arms and legs with blood. Allow thirty minutes for this exam. There are no restrictions or special instructions.  Your physician has requested that you have a lower extremity arterial duplex in 1 YEAR. This test is an ultrasound of the arteries in the legs. It looks at arterial blood flow in the legs. Allow one hour for Lower Arterial scans. There are no restrictions or special instructions  Your physician has requested that you have an aorto-iliac duplex in 1 YEAR. During this test, an ultrasound is used to evaluate the aorta and iliac arteries. Do not eat after midnight the day before and avoid carbonated beverages  Follow-Up: Your physician wants you to follow-up in: 1 YEAR with Dr Burt Knack. You will receive a reminder letter in the mail two months in advance. If you don't receive a letter, please call our office to schedule the follow-up appointment.   Any Other Special Instructions Will Be Listed Below (If Applicable).     If you need a refill on your cardiac medications before your next appointment, please call your pharmacy.

## 2017-03-12 ENCOUNTER — Other Ambulatory Visit: Payer: Self-pay | Admitting: Cardiovascular Disease

## 2017-06-14 ENCOUNTER — Ambulatory Visit: Payer: PPO | Admitting: Internal Medicine

## 2017-06-24 ENCOUNTER — Other Ambulatory Visit: Payer: Self-pay | Admitting: Cardiovascular Disease

## 2017-07-18 ENCOUNTER — Ambulatory Visit (INDEPENDENT_AMBULATORY_CARE_PROVIDER_SITE_OTHER): Payer: PPO | Admitting: Internal Medicine

## 2017-07-18 ENCOUNTER — Other Ambulatory Visit (INDEPENDENT_AMBULATORY_CARE_PROVIDER_SITE_OTHER): Payer: PPO

## 2017-07-18 ENCOUNTER — Encounter: Payer: Self-pay | Admitting: Internal Medicine

## 2017-07-18 VITALS — BP 114/66 | HR 64 | Temp 97.7°F | Ht 66.0 in | Wt 189.0 lb

## 2017-07-18 DIAGNOSIS — R7309 Other abnormal glucose: Secondary | ICD-10-CM | POA: Diagnosis not present

## 2017-07-18 DIAGNOSIS — F411 Generalized anxiety disorder: Secondary | ICD-10-CM

## 2017-07-18 DIAGNOSIS — N320 Bladder-neck obstruction: Secondary | ICD-10-CM

## 2017-07-18 DIAGNOSIS — G4733 Obstructive sleep apnea (adult) (pediatric): Secondary | ICD-10-CM | POA: Diagnosis not present

## 2017-07-18 DIAGNOSIS — I10 Essential (primary) hypertension: Secondary | ICD-10-CM | POA: Diagnosis not present

## 2017-07-18 DIAGNOSIS — I739 Peripheral vascular disease, unspecified: Secondary | ICD-10-CM

## 2017-07-18 DIAGNOSIS — N529 Male erectile dysfunction, unspecified: Secondary | ICD-10-CM | POA: Insufficient documentation

## 2017-07-18 LAB — HEPATIC FUNCTION PANEL
ALT: 30 U/L (ref 0–53)
AST: 30 U/L (ref 0–37)
Albumin: 4.5 g/dL (ref 3.5–5.2)
Alkaline Phosphatase: 67 U/L (ref 39–117)
BILIRUBIN TOTAL: 0.7 mg/dL (ref 0.2–1.2)
Bilirubin, Direct: 0.1 mg/dL (ref 0.0–0.3)
Total Protein: 7.2 g/dL (ref 6.0–8.3)

## 2017-07-18 LAB — BASIC METABOLIC PANEL
BUN: 14 mg/dL (ref 6–23)
CHLORIDE: 104 meq/L (ref 96–112)
CO2: 28 mEq/L (ref 19–32)
Calcium: 9.5 mg/dL (ref 8.4–10.5)
Creatinine, Ser: 1 mg/dL (ref 0.40–1.50)
GFR: 77.9 mL/min (ref >60.00)
Glucose, Bld: 138 mg/dL — ABNORMAL HIGH (ref 70–99)
Potassium: 4.4 mEq/L (ref 3.5–5.1)
SODIUM: 140 meq/L (ref 135–145)

## 2017-07-18 LAB — HEMOGLOBIN A1C: HEMOGLOBIN A1C: 6.7 % — AB (ref 4.6–6.5)

## 2017-07-18 MED ORDER — SILDENAFIL CITRATE 100 MG PO TABS: 100.0000 mg | ORAL_TABLET | ORAL | 5 refills | Status: DC | PRN

## 2017-07-18 NOTE — Assessment & Plan Note (Signed)
Discussed.

## 2017-07-18 NOTE — Assessment & Plan Note (Signed)
  Losartan, Toprol

## 2017-07-18 NOTE — Patient Instructions (Signed)
Xarelto low dose option - the pt will discuss w/Dr Burt Knack

## 2017-07-18 NOTE — Assessment & Plan Note (Signed)
Viagra prn 

## 2017-07-18 NOTE — Progress Notes (Signed)
Subjective:  Patient ID: Joshua Brown, male    DOB: 04-13-1946  Age: 72 y.o. MRN: [id number]  CC: No chief complaint on file.   HPI Joshua Brown presents for dyslipidemia, anxiety, HTN f/u. PAD f/u  Outpatient Medications Prior to Visit  Medication Sig Dispense Refill  . ALPRAZolam (XANAX) 0.5 MG tablet Take 1 tablet (0.5 mg total) by mouth at bedtime as needed for anxiety. 90 tablet 3  . atorvastatin (LIPITOR) 80 MG tablet Take 1 tablet (80 mg total) by mouth daily. 90 tablet 3  . b complex vitamins capsule Take 1 capsule by mouth daily.      . Cholecalciferol (VITAMIN D) 2000 UNIT CAPS Take 2,000 Units by mouth daily.     . clopidogrel (PLAVIX) 75 MG tablet TAKE 1 TABLET BY MOUTH EVERY DAY 90 tablet 2  . ezetimibe (ZETIA) 10 MG tablet TAKE 1 TABLET BY MOUTH EVERY DAY 90 tablet 2  . losartan (COZAAR) 25 MG tablet TAKE 1 TABLET BY MOUTH EVERY DAY 90 tablet 2  . metoprolol succinate (TOPROL-XL) 50 MG 24 hr tablet TAKE 1 TABLET BY MOUTH EVERY DAY WITH OR immediately following A MEAL 90 tablet 2  . niacin (NIASPAN) 500 MG CR tablet Take 2,000 mg by mouth at bedtime.      . nitroGLYCERIN (NITROSTAT) 0.4 MG SL tablet Place 1 tablet (0.4 mg total) under the tongue every 5 (five) minutes as needed for chest pain. 25 tablet 5  . pantoprazole (PROTONIX) 40 MG tablet TAKE 1 TABLET BY MOUTH EVERY DAY 90 tablet 2   No facility-administered medications prior to visit.     ROS Review of Systems  Constitutional: Negative for appetite change, fatigue and unexpected weight change.  HENT: Negative for congestion, nosebleeds, sneezing, sore throat and trouble swallowing.   Eyes: Negative for itching and visual disturbance.  Respiratory: Negative for cough.   Cardiovascular: Negative for chest pain, palpitations and leg swelling.  Gastrointestinal: Negative for abdominal distention, blood in stool, diarrhea and nausea.  Genitourinary: Negative for frequency and hematuria.  Musculoskeletal:  Negative for back pain, gait problem, joint swelling and neck pain.  Skin: Negative for rash.  Neurological: Negative for dizziness, tremors, speech difficulty and weakness.  Psychiatric/Behavioral: Negative for agitation, dysphoric mood and sleep disturbance. The patient is not nervous/anxious.     Objective:  BP 114/66 (BP Location: Left Arm, Patient Position: Sitting, Cuff Size: Large)   Pulse 64   Temp 97.7 F (36.5 C) (Oral)   Ht 5\' 6"  (1.676 m)   Wt 189 lb (85.7 kg)   SpO2 98%   BMI 30.51 kg/m   BP Readings from Last 3 Encounters:  07/18/17 114/66  02/07/17 110/70  12/10/16 120/76    Wt Readings from Last 3 Encounters:  07/18/17 189 lb (85.7 kg)  02/07/17 189 lb 12.8 oz (86.1 kg)  12/10/16 192 lb (87.1 kg)    Physical Exam  Constitutional: He is oriented to person, place, and time. He appears well-developed. No distress.  NAD  HENT:  Mouth/Throat: Oropharynx is clear and moist.  Eyes: Conjunctivae are normal. Pupils are equal, round, and reactive to light.  Neck: Normal range of motion. No JVD present. No thyromegaly present.  Cardiovascular: Normal rate, regular rhythm, normal heart sounds and intact distal pulses. Exam reveals no gallop and no friction rub.  No murmur heard. Pulmonary/Chest: Effort normal and breath sounds normal. No respiratory distress. He has no wheezes. He has no rales. He exhibits no tenderness.  Abdominal: Soft. Bowel sounds are normal. He exhibits no distension and no mass. There is no tenderness. There is no rebound and no guarding.  Musculoskeletal: Normal range of motion. He exhibits no edema or tenderness.  Lymphadenopathy:    He has no cervical adenopathy.  Neurological: He is alert and oriented to person, place, and time. He has normal reflexes. No cranial nerve deficit. He exhibits normal muscle tone. He displays a negative Romberg sign. Coordination and gait normal.  Skin: Skin is warm and dry. No rash noted.  Psychiatric: He has a  normal mood and affect. His behavior is normal. Judgment and thought content normal.    Lab Results  Component Value Date   WBC 9.1 12/10/2016   HGB 15.6 12/10/2016   HCT 46.3 12/10/2016   PLT 138.0 (L) 12/10/2016   GLUCOSE 132 (H) 12/10/2016   CHOL 128 12/10/2016   TRIG 86.0 12/10/2016   HDL 37.60 (L) 12/10/2016   LDLCALC 73 12/10/2016   ALT 26 12/10/2016   AST 25 12/10/2016   NA 138 12/10/2016   K 4.8 12/10/2016   CL 103 12/10/2016   CREATININE 1.13 12/10/2016   BUN 17 12/10/2016   CO2 31 12/10/2016   TSH 1.53 12/10/2016   PSA 0.49 12/10/2016   INR 1.1 RATIO (H) 05/16/2008   HGBA1C 6.7 (H) 12/10/2016    No results found.  Assessment & Plan:   There are no diagnoses linked to this encounter. I am having Alben Deeds maintain his niacin, Vitamin D, b complex vitamins, ALPRAZolam, nitroGLYCERIN, atorvastatin, clopidogrel, ezetimibe, pantoprazole, losartan, and metoprolol succinate.  No orders of the defined types were placed in this encounter.    Follow-up: No Follow-up on file.  Walker Kehr, MD

## 2017-07-18 NOTE — Assessment & Plan Note (Signed)
Xanax prn 

## 2017-07-18 NOTE — Assessment & Plan Note (Signed)
CPAP.  

## 2017-07-18 NOTE — Assessment & Plan Note (Signed)
Xarelto low dose option - the pt will discuss w/Dr Burt Knack

## 2017-11-09 ENCOUNTER — Encounter: Payer: Self-pay | Admitting: Pulmonary Disease

## 2017-11-09 ENCOUNTER — Ambulatory Visit: Payer: PPO | Admitting: Pulmonary Disease

## 2017-11-09 DIAGNOSIS — G4733 Obstructive sleep apnea (adult) (pediatric): Secondary | ICD-10-CM

## 2017-11-09 NOTE — Assessment & Plan Note (Signed)
He has lost more than 15% of his body weight and we should repeat home sleep study to see if underlying OSA has improved significantly to the point where he can come off his CPAP machine.  His mask leak is likely related to pillows being worn out and encouraged him to use new supplies Otherwise his compliance is great and CPAP is certainly helped improve his daytime somnolence and fatigue

## 2017-11-09 NOTE — Progress Notes (Signed)
   Subjective:    Patient ID: Joshua Brown, male    DOB: 1946-03-24, 72 y.o.   MRN: [id number]  HPI   72 year old for follow-up of OSA  2-year follow-up since he was set up with CPAP, he is adjusted well, this is helped improve his daytime somnolence and fatigue.  He has been active with the exam. He has lost 24 pounds with a protein diet to his current weight of 168. No problems with mask or pressure, he is settled down with nasal pillows.  No problems with dryness of nasal passages are mild.  Download was reviewed which shows average pressure of 10 cm on auto settings with mild leak and good control of events and excellent compliance about 8 hours every night  He has been using the same supplies for 2 years Continues to have nocturia  Significant tests/ events reviewed  2011- PSG-191 lbs- AHI 17/h -predom with supine REM  02/2016 HST >  AHI: 21.7/hr; lowest Sat 80%.    Review of Systems Patient denies significant dyspnea,cough, hemoptysis,  chest pain, palpitations, pedal edema, orthopnea, paroxysmal nocturnal dyspnea, lightheadedness, nausea, vomiting, abdominal or  leg pains      Objective:   Physical Exam  Gen. Pleasant, well-nourished, in no distress ENT - no thrush,long uvula no post nasal drip Neck: No JVD, no thyromegaly, no carotid bruits Lungs: no use of accessory muscles, no dullness to percussion, clear without rales or rhonchi  Cardiovascular: Rhythm regular, heart sounds  normal, no murmurs or gallops, no peripheral edema Musculoskeletal: No deformities, no cyanosis or clubbing        Assessment & Plan:

## 2017-11-09 NOTE — Patient Instructions (Signed)
Home sleep study to reassess due to weight loss Use new prongs

## 2017-12-09 ENCOUNTER — Telehealth: Payer: Self-pay | Admitting: Internal Medicine

## 2017-12-09 DIAGNOSIS — Z0000 Encounter for general adult medical examination without abnormal findings: Secondary | ICD-10-CM

## 2017-12-09 DIAGNOSIS — E785 Hyperlipidemia, unspecified: Secondary | ICD-10-CM

## 2017-12-09 DIAGNOSIS — E291 Testicular hypofunction: Secondary | ICD-10-CM

## 2017-12-09 DIAGNOSIS — N320 Bladder-neck obstruction: Secondary | ICD-10-CM

## 2017-12-09 DIAGNOSIS — I10 Essential (primary) hypertension: Secondary | ICD-10-CM

## 2017-12-09 DIAGNOSIS — R739 Hyperglycemia, unspecified: Secondary | ICD-10-CM

## 2017-12-09 NOTE — Telephone Encounter (Signed)
Copied from [place] [phone]. Topic: General - Other >> Dec 09, 2017 12:48 PM Neva Seat wrote: Pt needing CPE lab orders before his 3 pm CPE w/ Dr. Alain Marion on July 17 appt.  Pt wants to come on July 15 for labs.

## 2017-12-12 ENCOUNTER — Encounter: Payer: PPO | Admitting: Internal Medicine

## 2017-12-12 NOTE — Telephone Encounter (Signed)
Orders entered

## 2017-12-14 ENCOUNTER — Other Ambulatory Visit: Payer: Self-pay | Admitting: Cardiovascular Disease

## 2017-12-14 DIAGNOSIS — G4733 Obstructive sleep apnea (adult) (pediatric): Secondary | ICD-10-CM | POA: Diagnosis not present

## 2017-12-14 NOTE — Telephone Encounter (Signed)
It is indicated on rx request this was filled today for #90. Any refills authorized will be placed on file.   Patient will have enough medication to last until September but is due for an appointment in August.   Outpatient Medication Detail    Disp Refills Start End   metoprolol succinate (TOPROL-XL) 50 MG 24 hr tablet 90 tablet 2 06/24/2017    Sig: TAKE 1 TABLET BY MOUTH EVERY DAY WITH OR immediately following A MEAL   Sent to pharmacy as: metoprolol succinate (TOPROL-XL) 50 MG 24 hr tablet   E-Prescribing Status: Receipt confirmed by pharmacy (06/24/2017 11:37 AM EST)   Pharmacy   [facility], [place], [place] - [place]

## 2017-12-16 ENCOUNTER — Telehealth: Payer: Self-pay | Admitting: Pulmonary Disease

## 2017-12-16 DIAGNOSIS — G4733 Obstructive sleep apnea (adult) (pediatric): Secondary | ICD-10-CM | POA: Diagnosis not present

## 2017-12-16 NOTE — Telephone Encounter (Signed)
Per RA, HST showed moderate OSA with 18 events per hour. Events increase when sleeping in the supine position, 40 events on average. RA recommends continuing CPAP therapy.

## 2017-12-16 NOTE — Telephone Encounter (Signed)
Pt is aware of results and voiced his understanding. Nothing further is needed.  

## 2017-12-20 ENCOUNTER — Other Ambulatory Visit: Payer: Self-pay | Admitting: *Deleted

## 2017-12-20 DIAGNOSIS — G4733 Obstructive sleep apnea (adult) (pediatric): Secondary | ICD-10-CM

## 2018-01-16 ENCOUNTER — Other Ambulatory Visit (INDEPENDENT_AMBULATORY_CARE_PROVIDER_SITE_OTHER): Payer: PPO

## 2018-01-16 DIAGNOSIS — E291 Testicular hypofunction: Secondary | ICD-10-CM

## 2018-01-16 DIAGNOSIS — E785 Hyperlipidemia, unspecified: Secondary | ICD-10-CM

## 2018-01-16 DIAGNOSIS — Z0000 Encounter for general adult medical examination without abnormal findings: Secondary | ICD-10-CM | POA: Diagnosis not present

## 2018-01-16 DIAGNOSIS — N320 Bladder-neck obstruction: Secondary | ICD-10-CM

## 2018-01-16 DIAGNOSIS — I10 Essential (primary) hypertension: Secondary | ICD-10-CM

## 2018-01-16 DIAGNOSIS — R739 Hyperglycemia, unspecified: Secondary | ICD-10-CM

## 2018-01-16 LAB — CBC WITH DIFFERENTIAL/PLATELET
BASOS PCT: 0.6 % (ref 0.0–3.0)
Basophils Absolute: 0 10*3/uL (ref 0.0–0.1)
Eosinophils Absolute: 0.2 10*3/uL (ref 0.0–0.7)
Eosinophils Relative: 3.3 % (ref 0.0–5.0)
HCT: 43 % (ref 39.0–52.0)
HEMOGLOBIN: 14.4 g/dL (ref 13.0–17.0)
Lymphocytes Relative: 32.6 % (ref 12.0–46.0)
Lymphs Abs: 2.2 10*3/uL (ref 0.7–4.0)
MCHC: 33.5 g/dL (ref 30.0–36.0)
MCV: 89.2 fl (ref 78.0–100.0)
MONO ABS: 0.5 10*3/uL (ref 0.1–1.0)
Monocytes Relative: 7.6 % (ref 3.0–12.0)
NEUTROS ABS: 3.8 10*3/uL (ref 1.4–7.7)
Neutrophils Relative %: 55.9 % (ref 43.0–77.0)
PLATELETS: 122 10*3/uL — AB (ref 150.0–400.0)
RBC: 4.82 Mil/uL (ref 4.22–5.81)
RDW: 14.5 % (ref 11.5–15.5)
WBC: 6.8 10*3/uL (ref 4.0–10.5)

## 2018-01-16 LAB — URINALYSIS
Bilirubin Urine: NEGATIVE
Hgb urine dipstick: NEGATIVE
Ketones, ur: NEGATIVE
LEUKOCYTES UA: NEGATIVE
NITRITE: NEGATIVE
SPECIFIC GRAVITY, URINE: 1.015 (ref 1.000–1.030)
Total Protein, Urine: NEGATIVE
UROBILINOGEN UA: 0.2 (ref 0.0–1.0)
Urine Glucose: NEGATIVE
pH: 6 (ref 5.0–8.0)

## 2018-01-16 LAB — BASIC METABOLIC PANEL
BUN: 16 mg/dL (ref 6–23)
CO2: 28 mEq/L (ref 19–32)
Calcium: 9.3 mg/dL (ref 8.4–10.5)
Chloride: 103 mEq/L (ref 96–112)
Creatinine, Ser: 1.11 mg/dL (ref 0.40–1.50)
GFR: 68.97 mL/min (ref >60.00)
Glucose, Bld: 134 mg/dL — ABNORMAL HIGH (ref 70–99)
POTASSIUM: 4.7 meq/L (ref 3.5–5.1)
SODIUM: 139 meq/L (ref 135–145)

## 2018-01-16 LAB — LIPID PANEL
CHOLESTEROL: 108 mg/dL (ref 0–200)
HDL: 40.2 mg/dL (ref >39.00)
LDL Cholesterol: 56 mg/dL (ref 0–99)
NonHDL: 67.46
TRIGLYCERIDES: 59 mg/dL (ref 0.0–149.0)
Total CHOL/HDL Ratio: 3
VLDL: 11.8 mg/dL (ref 0.0–40.0)

## 2018-01-16 LAB — HEPATIC FUNCTION PANEL
ALBUMIN: 4.4 g/dL (ref 3.5–5.2)
ALK PHOS: 72 U/L (ref 39–117)
ALT: 26 U/L (ref 0–53)
AST: 27 U/L (ref 0–37)
Bilirubin, Direct: 0.2 mg/dL (ref 0.0–0.3)
TOTAL PROTEIN: 6.5 g/dL (ref 6.0–8.3)
Total Bilirubin: 0.7 mg/dL (ref 0.2–1.2)

## 2018-01-16 LAB — TESTOSTERONE: Testosterone: 184.31 ng/dL — ABNORMAL LOW (ref 300.00–890.00)

## 2018-01-16 LAB — TSH: TSH: 1.46 u[IU]/mL (ref 0.35–4.50)

## 2018-01-16 LAB — HEMOGLOBIN A1C: Hgb A1c MFr Bld: 6.4 % (ref 4.6–6.5)

## 2018-01-16 LAB — PSA: PSA: 0.37 ng/mL (ref 0.10–4.00)

## 2018-01-18 ENCOUNTER — Ambulatory Visit (INDEPENDENT_AMBULATORY_CARE_PROVIDER_SITE_OTHER): Payer: PPO | Admitting: Internal Medicine

## 2018-01-18 ENCOUNTER — Encounter: Payer: Self-pay | Admitting: Internal Medicine

## 2018-01-18 VITALS — BP 116/68 | HR 64 | Temp 98.4°F | Ht 66.0 in | Wt 167.0 lb

## 2018-01-18 DIAGNOSIS — E663 Overweight: Secondary | ICD-10-CM

## 2018-01-18 DIAGNOSIS — E785 Hyperlipidemia, unspecified: Secondary | ICD-10-CM

## 2018-01-18 DIAGNOSIS — Z0000 Encounter for general adult medical examination without abnormal findings: Secondary | ICD-10-CM | POA: Diagnosis not present

## 2018-01-18 DIAGNOSIS — H1131 Conjunctival hemorrhage, right eye: Secondary | ICD-10-CM | POA: Insufficient documentation

## 2018-01-18 DIAGNOSIS — R739 Hyperglycemia, unspecified: Secondary | ICD-10-CM

## 2018-01-18 DIAGNOSIS — D696 Thrombocytopenia, unspecified: Secondary | ICD-10-CM

## 2018-01-18 DIAGNOSIS — F411 Generalized anxiety disorder: Secondary | ICD-10-CM

## 2018-01-18 MED ORDER — ZOSTER VAC RECOMB ADJUVANTED 50 MCG/0.5ML IM SUSR: 0.5000 mL | Freq: Once | INTRAMUSCULAR | 1 refills | Status: AC

## 2018-01-18 MED ORDER — SILDENAFIL CITRATE 100 MG PO TABS: 100.0000 mg | ORAL_TABLET | ORAL | 5 refills | Status: DC | PRN

## 2018-01-18 NOTE — Assessment & Plan Note (Signed)
Will watch PLT 122K

## 2018-01-18 NOTE — Assessment & Plan Note (Signed)
On alprazolam low dose prn  Potential benefits of a long term benzodiazepines  use as well as potential risks  and complications were explained to the patient and were aknowledged.

## 2018-01-18 NOTE — Progress Notes (Signed)
Subjective:  Patient ID: Joshua Brown, male    DOB: 1945-07-22  Age: 72 y.o. MRN: [id number]  CC: No chief complaint on file.   HPI Joshua Brown presents for [facility] well Lost 25 lbs on diet Lost wt on diet C/o ED  Outpatient Medications Prior to Visit  Medication Sig Dispense Refill  . ALPRAZolam (XANAX) 0.5 MG tablet Take 1 tablet (0.5 mg total) by mouth at bedtime as needed for anxiety. 90 tablet 3  . atorvastatin (LIPITOR) 80 MG tablet Take 1 tablet (80 mg total) by mouth daily. 90 tablet 3  . b complex vitamins capsule Take 1 capsule by mouth daily.      . Cholecalciferol (VITAMIN D) 2000 UNIT CAPS Take 2,000 Units by mouth daily.     . clopidogrel (PLAVIX) 75 MG tablet TAKE 1 TABLET BY MOUTH EVERY DAY 90 tablet 2  . ezetimibe (ZETIA) 10 MG tablet TAKE 1 TABLET BY MOUTH EVERY DAY 90 tablet 2  . losartan (COZAAR) 25 MG tablet TAKE 1 TABLET BY MOUTH EVERY DAY 90 tablet 2  . metoprolol succinate (TOPROL-XL) 50 MG 24 hr tablet Take 1 tablet (50 mg total) by mouth daily. With or immediately following a meal. Please make appt with Dr. Burt Knack for August. 1st attempt 90 tablet 0  . niacin (NIASPAN) 500 MG CR tablet Take 2,000 mg by mouth at bedtime.      . nitroGLYCERIN (NITROSTAT) 0.4 MG SL tablet Place 1 tablet (0.4 mg total) under the tongue every 5 (five) minutes as needed for chest pain. 25 tablet 5  . pantoprazole (PROTONIX) 40 MG tablet TAKE 1 TABLET BY MOUTH EVERY DAY 90 tablet 2  . sildenafil (VIAGRA) 100 MG tablet Take 1 tablet (100 mg total) by mouth as needed for erectile dysfunction. 12 tablet 5   No facility-administered medications prior to visit.     ROS: Review of Systems  Constitutional: Negative for appetite change, fatigue and unexpected weight change.  HENT: Negative for congestion, nosebleeds, sneezing, sore throat and trouble swallowing.   Eyes: Negative for itching and visual disturbance.  Respiratory: Negative for cough.   Cardiovascular: Negative for chest  pain, palpitations and leg swelling.  Gastrointestinal: Negative for abdominal distention, blood in stool, diarrhea and nausea.  Genitourinary: Negative for frequency and hematuria.  Musculoskeletal: Negative for back pain, gait problem, joint swelling and neck pain.  Skin: Negative for rash.  Neurological: Negative for dizziness, tremors, speech difficulty and weakness.  Psychiatric/Behavioral: Negative for agitation, dysphoric mood, sleep disturbance and suicidal ideas. The patient is not nervous/anxious.     Objective:  BP 116/68 (BP Location: Left Arm, Patient Position: Sitting, Cuff Size: Large)   Pulse 64   Temp 98.4 F (36.9 C) (Oral)   Ht 5\' 6"  (1.676 m)   Wt 167 lb (75.8 kg)   SpO2 97%   BMI 26.95 kg/m   BP Readings from Last 3 Encounters:  01/18/18 116/68  11/09/17 118/70  07/18/17 114/66    Wt Readings from Last 3 Encounters:  01/18/18 167 lb (75.8 kg)  11/09/17 168 lb (76.2 kg)  07/18/17 189 lb (85.7 kg)    Physical Exam  Constitutional: He is oriented to person, place, and time. He appears well-developed. No distress.  NAD  HENT:  Mouth/Throat: Oropharynx is clear and moist.  Eyes: Pupils are equal, round, and reactive to light. Conjunctivae are normal.  Neck: Normal range of motion. No JVD present. No thyromegaly present.  Cardiovascular: Normal rate, regular rhythm,  normal heart sounds and intact distal pulses. Exam reveals no gallop and no friction rub.  No murmur heard. Pulmonary/Chest: Effort normal and breath sounds normal. No respiratory distress. He has no wheezes. He has no rales. He exhibits no tenderness.  Abdominal: Soft. Bowel sounds are normal. He exhibits no distension and no mass. There is no tenderness. There is no rebound and no guarding.  Musculoskeletal: Normal range of motion. He exhibits no edema or tenderness.  Lymphadenopathy:    He has no cervical adenopathy.  Neurological: He is alert and oriented to person, place, and time. He has  normal reflexes. No cranial nerve deficit. He exhibits normal muscle tone. He displays a negative Romberg sign. Coordination and gait normal.  Skin: Skin is warm and dry. No rash noted.  Psychiatric: He has a normal mood and affect. His behavior is normal. Judgment and thought content normal.  R eye subconj hemorrhage [facility] - Dr Denna Haggard Rectal - per Dr Earlean Shawl   Lab Results  Component Value Date   WBC 6.8 01/16/2018   HGB 14.4 01/16/2018   HCT 43.0 01/16/2018   PLT 122.0 (L) 01/16/2018   GLUCOSE 134 (H) 01/16/2018   CHOL 108 01/16/2018   TRIG 59.0 01/16/2018   HDL 40.20 01/16/2018   LDLCALC 56 01/16/2018   ALT 26 01/16/2018   AST 27 01/16/2018   NA 139 01/16/2018   K 4.7 01/16/2018   CL 103 01/16/2018   CREATININE 1.11 01/16/2018   BUN 16 01/16/2018   CO2 28 01/16/2018   TSH 1.46 01/16/2018   PSA 0.37 01/16/2018   INR 1.1 RATIO (H) 05/16/2008   HGBA1C 6.4 01/16/2018    No results found.  Assessment & Plan:   There are no diagnoses linked to this encounter.   No orders of the defined types were placed in this encounter.    Follow-up: No follow-ups on file.  Walker Kehr, MD

## 2018-01-18 NOTE — Assessment & Plan Note (Signed)
Better Lost wt

## 2018-01-18 NOTE — Assessment & Plan Note (Signed)
Lost wt on a low carb diet

## 2018-01-18 NOTE — Assessment & Plan Note (Signed)
  Here for medicare wellness/physical  Diet: heart healthy  Physical activity: not sedentary  Depression/mood screen: negative  Hearing: intact to whispered voice  Visual acuity: grossly normal w/glasses, performs annual eye exam  ADLs: capable  Fall risk: low to none  Home safety: good  Cognitive evaluation: intact to orientation, naming, recall and repetition  EOL planning: adv directives, full code/ I agree  I have personally reviewed and have noted  1. The patient's medical, surgical and social history  2. Their use of alcohol, tobacco or illicit drugs  3. Their current medications and supplements  4. The patient's functional ability including ADL's, fall risks, home safety risks and hearing or visual impairment.  5. Diet and physical activities  6. Evidence for depression or mood disorders 7. The roster of all physicians providing medical care to patient - is listed in the Snapshot section of the chart and reviewed today.    Today patient counseled on age appropriate routine health concerns for screening and prevention, each reviewed and up to date or declined. Immunizations reviewed and up to date or declined. Labs ordered and reviewed. Risk factors for depression reviewed and negative. Hearing function and visual acuity are intact. ADLs screened and addressed as needed. Functional ability and level of safety reviewed and appropriate. Education, counseling and referrals performed based on assessed risks today. Patient provided with a copy of personalized plan for preventive services.    Colonoscopy and EGD is due 2019 -  last report from Dr. Earlean Shawl (May '13)

## 2018-01-18 NOTE — Assessment & Plan Note (Signed)
Zetia, Lipitor, Niacin 

## 2018-01-31 ENCOUNTER — Other Ambulatory Visit: Payer: Self-pay | Admitting: Pulmonary Disease

## 2018-01-31 ENCOUNTER — Encounter: Payer: Self-pay | Admitting: Pulmonary Disease

## 2018-01-31 DIAGNOSIS — E119 Type 2 diabetes mellitus without complications: Secondary | ICD-10-CM | POA: Diagnosis not present

## 2018-01-31 DIAGNOSIS — G4733 Obstructive sleep apnea (adult) (pediatric): Secondary | ICD-10-CM

## 2018-01-31 LAB — HM DIABETES EYE EXAM

## 2018-02-03 ENCOUNTER — Telehealth: Payer: Self-pay

## 2018-02-03 NOTE — Telephone Encounter (Signed)
   Primary Cardiologist: Sherren Mocha, MD  Chart reviewed as part of pre-operative protocol coverage. Pt has history of premature CAD with remote MI age 72, CABG age 65, peripheral arterial disease with distal abdominal aortic stenosis and iliac stenosis, nonobstructive carotid disease, HLD, HTN. Last seen 02/2017. Will route to Dr. Burt Knack for input on holding Plavix as requested (pt is not on ASA). Dr. Burt Knack - - Please route response to [facility] PREOP (the pre-op pool). Thank you.  After Dr. Burt Knack replies, pt will require phone call as it has been almost a year since being seen. He has f/u scheduled 05/2018.  Charlie Pitter, PA-C 02/03/2018, 2:30 PM

## 2018-02-03 NOTE — Telephone Encounter (Signed)
   Primary Cardiologist: Sherren Mocha, MD  Chart reviewed as part of pre-operative protocol coverage. Notes attached below, reviewed by Dr. Burt Knack who states, "Pt well known to me. As long as his symptoms are unchanged, he is at low risk of holding plavix x 5 days for endoscopy and he can proceed as planned." I spoke with patient who affirms he's been doing great from a cardiac standpoint. He has been actively working out and has lost 23lb as a result. No angina, dyspnea, or new cardiovascular events.  Given past medical history and time since last visit, based on ACC/AHA guidelines, Joshua Brown would be at acceptable risk for the planned procedure without further cardiovascular testing.   I will route this recommendation to the requesting party via Epic fax function and remove from pre-op pool.  Please call with questions.  Charlie Pitter, PA-C 02/03/2018, 4:16 PM

## 2018-02-03 NOTE — Telephone Encounter (Signed)
Pt well known to me. As long as his symptoms are unchanged, he is at low risk of holding plavix x 5 days for endoscopy and he can proceed as planned. thanks

## 2018-02-03 NOTE — Telephone Encounter (Signed)
   [facility] Pre-operative Risk Assessment    Request for surgical clearance:  1. What type of surgery is being performed? Colonoscopy and Endoscopy   2. When is this surgery scheduled?  02/21/18   3. What type of clearance is required (medical clearance vs. Pharmacy clearance to hold med vs. Both)? Med  4. Are there any medications that need to be held prior to surgery and how long?Plavix   5. Practice name and name of physician performing surgery? Angela Nevin, [facility]   6. What is your office phone number [phone]    7.   What is your office fax number [phone]  8.   Anesthesia type (None, local, MAC, general) ?  MAC   Joshua Brown 02/03/2018, 12:30 PM  _________________________________________________________________   (provider comments below)

## 2018-02-07 ENCOUNTER — Encounter (HOSPITAL_COMMUNITY): Payer: PPO

## 2018-02-07 ENCOUNTER — Ambulatory Visit (HOSPITAL_COMMUNITY)
Admission: RE | Admit: 2018-02-07 | Discharge: 2018-02-07 | Disposition: A | Discharge status: Home / Self Care | Payer: PPO | Source: Ambulatory Visit | Attending: Cardiology | Admitting: Cardiology

## 2018-02-07 DIAGNOSIS — I739 Peripheral vascular disease, unspecified: Secondary | ICD-10-CM | POA: Insufficient documentation

## 2018-02-07 DIAGNOSIS — I700 Atherosclerosis of aorta: Secondary | ICD-10-CM | POA: Insufficient documentation

## 2018-02-07 DIAGNOSIS — I2510 Atherosclerotic heart disease of native coronary artery without angina pectoris: Secondary | ICD-10-CM

## 2018-02-10 ENCOUNTER — Telehealth: Payer: Self-pay

## 2018-02-10 NOTE — Telephone Encounter (Signed)
Notes recorded by Frederik Schmidt, RN on 02/10/2018 at 8:24 AM EDT Informed patient of results/recommendations. He verbalized understanding. ------

## 2018-02-10 NOTE — Telephone Encounter (Signed)
-----   Message from Nelva Bush, MD sent at 02/09/2018  9:39 PM EDT ----- Please let Mr. Schnitzer know that his vascular studies suggest some narrowing of the aorta and common iliac arteries, with mildly decreased blood flow to the left leg (slightly worse than in 2017).  He should continue his current medications; Dr. Burt Knack will follow-up with him to discuss need for additional testing/intervention.c

## 2018-02-14 DIAGNOSIS — G4733 Obstructive sleep apnea (adult) (pediatric): Secondary | ICD-10-CM | POA: Diagnosis not present

## 2018-02-16 ENCOUNTER — Encounter: Payer: Self-pay | Admitting: Internal Medicine

## 2018-02-17 ENCOUNTER — Telehealth: Payer: Self-pay | Admitting: Cardiovascular Disease

## 2018-02-17 NOTE — Telephone Encounter (Signed)
New Message    Patient returning call that he received. He is unclear as to what its in reference to. Please call.

## 2018-02-17 NOTE — Telephone Encounter (Signed)
Reviewed results with patient.  He understands to call if he experiences leg pain or problems walking. He says now he feels great - he is exercising and his symptoms are subsiding. He was grateful for call.

## 2018-02-17 NOTE — Telephone Encounter (Signed)
-----   Message from Sherren Mocha, MD sent at 02/15/2018 11:02 PM EDT ----- Study reviewed. Pt with known aorto-iliac stenosis. As long as his symptoms are stable ok to FU as scheduled in November and continue current medical therapy. If progressive leg pain or walking impairment I'm happy to see him back sooner to further evaluate.

## 2018-02-20 DIAGNOSIS — Z86010 Personal history of colonic polyps: Secondary | ICD-10-CM | POA: Insufficient documentation

## 2018-02-20 DIAGNOSIS — K2270 Barrett's esophagus without dysplasia: Secondary | ICD-10-CM | POA: Insufficient documentation

## 2018-02-21 ENCOUNTER — Encounter: Payer: Self-pay | Admitting: Internal Medicine

## 2018-02-21 DIAGNOSIS — K2270 Barrett's esophagus without dysplasia: Secondary | ICD-10-CM | POA: Diagnosis not present

## 2018-02-21 DIAGNOSIS — K648 Other hemorrhoids: Secondary | ICD-10-CM | POA: Diagnosis not present

## 2018-02-21 DIAGNOSIS — K219 Gastro-esophageal reflux disease without esophagitis: Secondary | ICD-10-CM | POA: Diagnosis not present

## 2018-02-21 DIAGNOSIS — K209 Esophagitis, unspecified: Secondary | ICD-10-CM | POA: Diagnosis not present

## 2018-02-21 DIAGNOSIS — Z86010 Personal history of colonic polyps: Secondary | ICD-10-CM | POA: Diagnosis not present

## 2018-02-21 DIAGNOSIS — Z1211 Encounter for screening for malignant neoplasm of colon: Secondary | ICD-10-CM | POA: Diagnosis not present

## 2018-02-21 DIAGNOSIS — D124 Benign neoplasm of descending colon: Secondary | ICD-10-CM | POA: Diagnosis not present

## 2018-02-21 DIAGNOSIS — K635 Polyp of colon: Secondary | ICD-10-CM | POA: Diagnosis not present

## 2018-02-21 DIAGNOSIS — K5140 Inflammatory polyps of colon without complications: Secondary | ICD-10-CM | POA: Diagnosis not present

## 2018-02-21 LAB — HM COLONOSCOPY

## 2018-02-24 ENCOUNTER — Encounter: Payer: Self-pay | Admitting: Internal Medicine

## 2018-03-05 ENCOUNTER — Encounter

## 2018-03-08 ENCOUNTER — Other Ambulatory Visit: Payer: Self-pay | Admitting: Dermatology

## 2018-03-08 DIAGNOSIS — L821 Other seborrheic keratosis: Secondary | ICD-10-CM | POA: Diagnosis not present

## 2018-03-08 DIAGNOSIS — L219 Seborrheic dermatitis, unspecified: Secondary | ICD-10-CM | POA: Diagnosis not present

## 2018-03-08 DIAGNOSIS — D229 Melanocytic nevi, unspecified: Secondary | ICD-10-CM | POA: Diagnosis not present

## 2018-03-08 DIAGNOSIS — D485 Neoplasm of uncertain behavior of skin: Secondary | ICD-10-CM | POA: Diagnosis not present

## 2018-03-08 DIAGNOSIS — D225 Melanocytic nevi of trunk: Secondary | ICD-10-CM | POA: Diagnosis not present

## 2018-03-08 HISTORY — DX: Melanocytic nevi, unspecified: D22.9

## 2018-03-13 ENCOUNTER — Other Ambulatory Visit: Payer: Self-pay | Admitting: Cardiovascular Disease

## 2018-04-06 DIAGNOSIS — Z0110 Encounter for examination of ears and hearing without abnormal findings: Secondary | ICD-10-CM

## 2018-04-07 ENCOUNTER — Telehealth: Payer: Self-pay | Admitting: Internal Medicine

## 2018-04-07 NOTE — Telephone Encounter (Signed)
LMTCB

## 2018-04-07 NOTE — Telephone Encounter (Signed)
Copied from [place] [id number]. Topic: Quick Communication - See Telephone Encounter >> Apr 07, 2018 11:42 AM Blase Mess A wrote: CRM for notification. See Telephone encounter for: 04/07/18.Joshua Brown is calling from [facility] [employer] center this is no NPI for Dr Alain Marion on the referral.  Joshua Brown is also has health insurance questions.  Please advise -[phone]

## 2018-04-10 ENCOUNTER — Ambulatory Visit (INDEPENDENT_AMBULATORY_CARE_PROVIDER_SITE_OTHER): Payer: PPO | Admitting: *Deleted

## 2018-04-10 DIAGNOSIS — Z23 Encounter for immunization: Secondary | ICD-10-CM

## 2018-04-10 NOTE — Addendum Note (Signed)
Addended by: Karren Cobble on: 04/10/2018 03:07 PM   Modules accepted: Orders

## 2018-04-10 NOTE — Telephone Encounter (Signed)
Michelene Heady returned call stating that she forgot to request prior auth, med list and copy of medical card faxed over.   Fax# [phone]

## 2018-04-10 NOTE — Telephone Encounter (Signed)
faxed

## 2018-05-05 ENCOUNTER — Encounter: Payer: Self-pay | Admitting: Cardiovascular Disease

## 2018-05-05 ENCOUNTER — Ambulatory Visit: Payer: PPO | Admitting: Cardiovascular Disease

## 2018-05-05 VITALS — BP 142/78 | HR 56 | Ht 66.0 in | Wt 168.8 lb

## 2018-05-05 DIAGNOSIS — I10 Essential (primary) hypertension: Secondary | ICD-10-CM

## 2018-05-05 DIAGNOSIS — E782 Mixed hyperlipidemia: Secondary | ICD-10-CM

## 2018-05-05 DIAGNOSIS — I2510 Atherosclerotic heart disease of native coronary artery without angina pectoris: Secondary | ICD-10-CM

## 2018-05-05 DIAGNOSIS — I739 Peripheral vascular disease, unspecified: Secondary | ICD-10-CM | POA: Diagnosis not present

## 2018-05-05 DIAGNOSIS — I700 Atherosclerosis of aorta: Secondary | ICD-10-CM

## 2018-05-05 NOTE — Progress Notes (Signed)
Cardiology Office Note:    Date:  05/05/2018   ID:  Piers, Baade [birth year], MRN [id number]  PCP:  Cassandria Anger, MD  Cardiologist:  Sherren Mocha, MD  Electrophysiologist:  None   Referring MD: Cassandria Anger, MD   Chief Complaint  Patient presents with  . Coronary Artery Disease    History of Present Illness:    Joshua Brown is a 72 y.o. male with a hx of coronary artery disease with remote MI at age 25 followed by CABG at age 30.  The patient is also followed for peripheral arterial disease with distal aortic and iliac stenosis.  The patient is here alone today.  He is feeling extremely well.  He's exercising regularly and following a carbohydrate restricted diet. He's lost over 20# in the last year and is feeling well.  Today, he denies symptoms of palpitations, chest pain, shortness of breath, orthopnea, PND, lower extremity edema, dizziness, or syncope.  He has no symptoms of calf, thigh, or buttock claudication.   Past Medical History:  Diagnosis Date  . ABDOMINAL AORTIC ANEURYSM 11/12/2008   has annual U/s  . CAD (coronary artery disease)    premature  . DDD (degenerative disc disease)    lumbar spine  . ED (erectile dysfunction)   . Frequent urination at night    nocturia 2-3  . GERD (gastroesophageal reflux disease)   . Hyperglycemia    Borderline  . Hyperlipidemia    Atherogenic  . Hypertension   . Myocardial infarct, old   . Obesity    Class I  . PVD (peripheral vascular disease) ([place])    ABI June '12 -0.9 bilateral.    Past Surgical History:  Procedure Laterality Date  . colonoscopy     one month ago  . CORONARY ARTERY BYPASS GRAFT    . TONSILLECTOMY     age 61    Current Medications: Current Meds  Medication Sig  . ALPRAZolam (XANAX) 0.5 MG tablet Take 1 tablet (0.5 mg total) by mouth at bedtime as needed for anxiety.  Marland Kitchen atorvastatin (LIPITOR) 80 MG tablet TAKE 1 TABLET BY MOUTH daily  . b complex vitamins capsule Take 1  capsule by mouth daily.    . Cholecalciferol (VITAMIN D) 2000 UNIT CAPS Take 2,000 Units by mouth daily.   . clopidogrel (PLAVIX) 75 MG tablet TAKE 1 TABLET BY MOUTH EVERY DAY  . ezetimibe (ZETIA) 10 MG tablet TAKE 1 TABLET BY MOUTH EVERY DAY  . losartan (COZAAR) 25 MG tablet TAKE 1 TABLET BY MOUTH EVERY DAY  . metoprolol succinate (TOPROL-XL) 50 MG 24 hr tablet Take 1 tablet (50 mg total) by mouth daily. With or immediately following a meal. Please make appt with Dr. Burt Knack for August. 1st attempt  . niacin (NIASPAN) 500 MG CR tablet Take 2,000 mg by mouth at bedtime.    . nitroGLYCERIN (NITROSTAT) 0.4 MG SL tablet Place 1 tablet (0.4 mg total) under the tongue every 5 (five) minutes as needed for chest pain.  . pantoprazole (PROTONIX) 40 MG tablet TAKE 1 TABLET BY MOUTH EVERY DAY  . sildenafil (VIAGRA) 100 MG tablet Take 1 tablet (100 mg total) by mouth as needed for erectile dysfunction.  . triamcinolone cream (KENALOG) 0.1 % Apply 1 application topically as needed.     Allergies:   Patient has no known allergies.   Social History   Socioeconomic History  . Marital status: Married    Spouse name: Not on file  .  Number of children: 3  . Years of education: 54  . Highest education level: Not on file  Occupational History  . 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 Financial resource strain: Not on file  . Food insecurity:    Worry: Not on file    Inability: Not on file  . Transportation needs:    Medical: Not on file    Non-medical: Not on file  Tobacco Use  . Smoking status: Former Smoker    Packs/day: 1.00    Years: 16.00    Pack years: 16.00    Types: Cigarettes    Last attempt to quit: 07/05/1969    Years since quitting: 48.8  . Smokeless tobacco: Never Used  Substance and Sexual Activity  . Alcohol use: Yes    Alcohol/week: 2.0 standard drinks    Types: 2 drink(s) per week    Comment: socially   . Drug use: No  . Sexual activity: Yes    Partners: Female  Lifestyle  . Physical activity:    Days per week: Not on file    Minutes per session: Not on file  . Stress: Not on file  Relationships  . Social connections:    Talks on phone: Not on file    Gets together: Not on file    Attends religious service: Not on file    Active member of club or organization: Not on file    Attends meetings of clubs or organizations: Not on file    Relationship status: Not on file  Other Topics Concern  . Not on file  Social History Narrative   HSG, [employer] 1 year. Married 1977. 1 son - 34; 2 daughters - 47, 60; 3  Grandchildren. 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 Marriage in good health. ACP - discussed. Provided packet May '13.     Family History: The patient's family history includes CAD in his father and paternal grandfather; Cancer in his mother; Congestive Heart Failure in his mother; Early death in his father; Heart attack in his father and paternal grandfather; Heart disease in his father and paternal grandfather; Hyperlipidemia in his mother; Prostate cancer in his brother.  ROS:   Please see the history of present illness.    All other systems reviewed and are negative.  EKGs/Labs/Other Studies Reviewed:    The following studies were reviewed today: ABI Mar 08, 2018: Final Interpretation: Right: Resting right ankle-brachial index is within normal range. No evidence of significant right lower extremity arterial disease. The right toe-brachial index is normal.  Left: Resting left ankle-brachial index indicates mild left lower extremity arterial disease. The left toe-brachial index is abnormal.  Abdominal Aorta Findings: +-------------+-------+----------+----------+--------+--------+-------------+ Location   AP (cm)Trans (cm)PSV (cm/s)WaveformThrombusComments    +-------------+-------+----------+----------+--------+--------+-------------+ Proximal   1.90   1.90   120    biphasic            +-------------+-------+----------+----------+--------+--------+-------------+ Mid     1.10  1.10   369    biphasicPresent >50% stenosis +-------------+-------+----------+----------+--------+--------+-------------+ Distal    1.10  1.20   291    biphasicPresent >50% stenosis +-------------+-------+----------+----------+--------+--------+-------------+ RT CIA Prox 0.8  0.8    272    biphasic    >50% stenosis +-------------+-------+----------+----------+--------+--------+-------------+ RT CIA Mid           154    biphasic            +-------------+-------+----------+----------+--------+--------+-------------+ RT CIA  Distal         137    biphasic            +-------------+-------+----------+----------+--------+--------+-------------+ RT EIA Prox 0.8  0.8    139    biphasic            +-------------+-------+----------+----------+--------+--------+-------------+ RT EIA Mid           130    biphasic            +-------------+-------+----------+----------+--------+--------+-------------+ RT EIA Distal         118    biphasic            +-------------+-------+----------+----------+--------+--------+-------------+ LT CIA Prox 0.8  0.8    262    biphasic    >50% stenosis +-------------+-------+----------+----------+--------+--------+-------------+ LT CIA Mid           194    biphasic            +-------------+-------+----------+----------+--------+--------+-------------+ LT CIA Distal         145    biphasic            +-------------+-------+----------+----------+--------+--------+-------------+ LT EIA Prox 0.9  0.9    131    biphasic             +-------------+-------+----------+----------+--------+--------+-------------+ LT EIA Mid           118    biphasic            +-------------+-------+----------+----------+--------+--------+-------------+ LT EIA Distal         121    biphasic            +-------------+-------+----------+----------+--------+--------+-------------+ Visualization of the Left EIA Proximal artery was limited. IVC/Iliac Findings: +--------+------+--------+-------+     PatentThrombusComment +--------+------+--------+-------+ IVC Proxpatent         +--------+------+--------+-------+   Final Interpretation: Abdominal Aorta: No evidence of an abdominal aortic aneurysm was visualized. The largest aortic measurement is 1.9 cm. Stenosis: +--------------------+-------------+ Location      Stenosis    +--------------------+-------------+ Mid Aorta      >50% stenosis +--------------------+-------------+ Distal Aorta    >50% stenosis +--------------------+-------------+ Right Common Iliac >50% stenosis +--------------------+-------------+ Left Common Iliac  >50% stenosis +--------------------+-------------+ Right External Iliac<50% stenosis +--------------------+-------------+ Left External Iliac <50% stenosis +--------------------+-------------+  EKG:  EKG is ordered today.  The ekg ordered today demonstrates sinus bradycardia 56 bpm, otherwise normal EKG.  Recent Labs: 01/16/2018: ALT 26; BUN 16; Creatinine, Ser 1.11; Hemoglobin 14.4; Platelets 122.0; Potassium 4.7; Sodium 139; TSH 1.46  Recent Lipid Panel    Component Value Date/Time   CHOL 108 01/16/2018 0808   TRIG 59.0 01/16/2018 0808   HDL 40.20 01/16/2018 0808   CHOLHDL 3 01/16/2018 0808   VLDL 11.8 01/16/2018 0808   LDLCALC 56 01/16/2018 0808    Physical Exam:    VS:  BP (!) 142/78   Pulse (!) 56   Ht 5\' 6"  (1.676 m)    Wt 168 lb 12.8 oz (76.6 kg)   SpO2 99%   BMI 27.25 kg/m     Wt Readings from Last 3 Encounters:  05/05/18 168 lb 12.8 oz (76.6 kg)  01/18/18 167 lb (75.8 kg)  11/09/17 168 lb (76.2 kg)    GEN:  Well nourished, well developed in no acute distress HEENT: Normal NECK: No JVD; No carotid bruits LYMPHATICS: No lymphadenopathy CARDIAC: RRR, no murmurs, rubs, gallops RESPIRATORY:  Clear to auscultation without rales, wheezing or rhonchi  ABDOMEN: Soft, non-tender, non-distended MUSCULOSKELETAL:  No edema; No deformity  SKIN: Warm and dry NEUROLOGIC:  Alert and  oriented x 3 PSYCHIATRIC:  Normal affect   ASSESSMENT:    1. Coronary artery disease involving native coronary artery of native heart without angina pectoris   2. PAD (peripheral artery disease) ([place])   3. Atherosclerosis of aorta ([place])   4. Mixed hyperlipidemia   5. Essential hypertension    PLAN:    In order of problems listed above:  1. The patient is stable without symptoms of angina.  His medical program is reviewed and includes clopidogrel for antiplatelet therapy, high intensity statin drug, and a beta-blocker.  No changes are made today. 2. The patient has improved exercise capacity with his new exercise regime.  He is doing very well and should continue on medical therapy.  I did review his ABIs and abdominal ultrasound study demonstrating some increase in velocities in his distal aorta and left iliac artery with a borderline ABI of 0.92 on the left.  He will continue medical therapy and lifestyle modification. 3. As above on an high intensity statin drug and clopidogrel. 4. Lipids are at goal on a combination of atorvastatin and ezetimibe with an LDL cholesterol of 56 mg/dL. 5. Continue metoprolol and losartan.  For follow-up I will see the patient back in 1 year with an aortoiliac Doppler study and ABIs prior to his office visit.   Medication Adjustments/Labs and Tests Ordered: Current medicines are reviewed at  length with the patient today.  Concerns regarding medicines are outlined above.  Orders Placed This Encounter  Procedures  . EKG 12-Lead   No orders of the defined types were placed in this encounter.   Patient Instructions  Medication Instructions:  Your provider recommends that you continue on your current medications as directed. Please refer to the Current Medication list given to you today.    Labwork: None  Testing/Procedures: You will be called to arrange your ultrasounds prior to your appointment next year.  Follow-Up: Your provider wants you to follow-up in: 1 year with Dr. Burt Knack. You will receive a reminder letter in the mail two months in advance. If you don't receive a letter, please call our office to schedule the follow-up appointment.    Any Other Special Instructions Will Be Listed Below (If Applicable).     If you need a refill on your cardiac medications before your next appointment, please call your pharmacy.      Signed, Sherren Mocha, MD  05/05/2018 1:52 PM    [facility]

## 2018-05-05 NOTE — Patient Instructions (Signed)
Medication Instructions:  Your provider recommends that you continue on your current medications as directed. Please refer to the Current Medication list given to you today.    Labwork: None  Testing/Procedures: You will be called to arrange your ultrasounds prior to your appointment next year.  Follow-Up: Your provider wants you to follow-up in: 1 year with Dr. Burt Knack. You will receive a reminder letter in the mail two months in advance. If you don't receive a letter, please call our office to schedule the follow-up appointment.    Any Other Special Instructions Will Be Listed Below (If Applicable).     If you need a refill on your cardiac medications before your next appointment, please call your pharmacy.

## 2018-05-08 ENCOUNTER — Other Ambulatory Visit: Payer: Self-pay | Admitting: Internal Medicine

## 2018-05-08 MED ORDER — ALPRAZOLAM 0.5 MG PO TABS: 0.5000 mg | ORAL_TABLET | Freq: Every evening | ORAL | 3 refills | Status: DC | PRN

## 2018-05-24 DIAGNOSIS — G4733 Obstructive sleep apnea (adult) (pediatric): Secondary | ICD-10-CM | POA: Diagnosis not present

## 2018-05-30 DIAGNOSIS — H903 Sensorineural hearing loss, bilateral: Secondary | ICD-10-CM | POA: Diagnosis not present

## 2018-06-08 ENCOUNTER — Other Ambulatory Visit: Payer: Self-pay | Admitting: Cardiovascular Disease

## 2018-06-08 MED ORDER — LOSARTAN POTASSIUM 25 MG PO TABS: 25.0000 mg | ORAL_TABLET | Freq: Every day | ORAL | 3 refills | Status: DC

## 2018-06-08 MED ORDER — METOPROLOL SUCCINATE ER 50 MG PO TB24: 50.0000 mg | ORAL_TABLET | Freq: Every day | ORAL | 3 refills | Status: DC

## 2018-06-08 MED ORDER — EZETIMIBE 10 MG PO TABS: 10.0000 mg | ORAL_TABLET | Freq: Every day | ORAL | 3 refills | Status: DC

## 2018-06-08 NOTE — Addendum Note (Signed)
Addended by: Derl Barrow on: 06/08/2018 12:24 PM   Modules accepted: Orders

## 2018-06-16 ENCOUNTER — Other Ambulatory Visit: Payer: Self-pay | Admitting: *Deleted

## 2018-06-16 MED ORDER — ATORVASTATIN CALCIUM 80 MG PO TABS: 80.0000 mg | ORAL_TABLET | Freq: Every day | ORAL | 3 refills | Status: DC

## 2018-06-16 MED ORDER — CLOPIDOGREL BISULFATE 75 MG PO TABS: 75.0000 mg | ORAL_TABLET | Freq: Every day | ORAL | 3 refills | Status: DC

## 2018-06-16 MED ORDER — PANTOPRAZOLE SODIUM 40 MG PO TBEC: 40.0000 mg | DELAYED_RELEASE_TABLET | Freq: Every day | ORAL | 3 refills | Status: DC

## 2018-07-25 ENCOUNTER — Ambulatory Visit (INDEPENDENT_AMBULATORY_CARE_PROVIDER_SITE_OTHER): Payer: PPO | Admitting: Internal Medicine

## 2018-07-25 ENCOUNTER — Encounter: Payer: Self-pay | Admitting: Internal Medicine

## 2018-07-25 DIAGNOSIS — I2510 Atherosclerotic heart disease of native coronary artery without angina pectoris: Secondary | ICD-10-CM

## 2018-07-25 DIAGNOSIS — N529 Male erectile dysfunction, unspecified: Secondary | ICD-10-CM

## 2018-07-25 DIAGNOSIS — H6122 Impacted cerumen, left ear: Secondary | ICD-10-CM | POA: Diagnosis not present

## 2018-07-25 DIAGNOSIS — I10 Essential (primary) hypertension: Secondary | ICD-10-CM | POA: Diagnosis not present

## 2018-07-25 DIAGNOSIS — E785 Hyperlipidemia, unspecified: Secondary | ICD-10-CM | POA: Diagnosis not present

## 2018-07-25 DIAGNOSIS — H6120 Impacted cerumen, unspecified ear: Secondary | ICD-10-CM | POA: Insufficient documentation

## 2018-07-25 DIAGNOSIS — I2583 Coronary atherosclerosis due to lipid rich plaque: Secondary | ICD-10-CM

## 2018-07-25 NOTE — Assessment & Plan Note (Signed)
No angina F/u w/Dr Burt Knack

## 2018-07-25 NOTE — Patient Instructions (Addendum)
If you have medicare related insurance (such as traditional Medicare, Blue Cross Medicare, United HealthCare Medicare, or similar), Please make an appointment at the scheduling desk with Jill, the Wellness Health Coach, for your Wellness visit in this office, which is a benefit with your insurance.  

## 2018-07-25 NOTE — Assessment & Plan Note (Signed)
See procedure 

## 2018-07-25 NOTE — Assessment & Plan Note (Signed)
Zetia, Lipitor, Niacin 

## 2018-07-25 NOTE — Assessment & Plan Note (Signed)
Losartan, Toprol

## 2018-07-25 NOTE — Progress Notes (Signed)
Subjective:  Patient ID: Joshua Brown, male    DOB: [birth year]  Age: 73 y.o. MRN: [id number]  CC: No chief complaint on file.   HPI Joshua Brown presents for anxiety, dyslipidemia, CAD f/u On CPAP  Outpatient Medications Prior to Visit  Medication Sig Dispense Refill  . ALPRAZolam (XANAX) 0.5 MG tablet Take 1 tablet (0.5 mg total) by mouth at bedtime as needed for anxiety. 90 tablet 3  . atorvastatin (LIPITOR) 80 MG tablet Take 1 tablet (80 mg total) by mouth daily. 90 tablet 3  . b complex vitamins capsule Take 1 capsule by mouth daily.      . Cholecalciferol (VITAMIN D) 2000 UNIT CAPS Take 2,000 Units by mouth daily.     . clopidogrel (PLAVIX) 75 MG tablet Take 1 tablet (75 mg total) by mouth daily. 90 tablet 3  . ezetimibe (ZETIA) 10 MG tablet Take 1 tablet (10 mg total) by mouth daily. 90 tablet 3  . losartan (COZAAR) 25 MG tablet Take 1 tablet (25 mg total) by mouth daily. 90 tablet 3  . metoprolol succinate (TOPROL-XL) 50 MG 24 hr tablet Take 1 tablet (50 mg total) by mouth daily. With or immediately following a meal. 90 tablet 3  . niacin (NIASPAN) 500 MG CR tablet Take 2,000 mg by mouth at bedtime.      . nitroGLYCERIN (NITROSTAT) 0.4 MG SL tablet Place 1 tablet (0.4 mg total) under the tongue every 5 (five) minutes as needed for chest pain. 25 tablet 5  . pantoprazole (PROTONIX) 40 MG tablet Take 1 tablet (40 mg total) by mouth daily. 90 tablet 3  . sildenafil (VIAGRA) 100 MG tablet Take 1 tablet (100 mg total) by mouth as needed for erectile dysfunction. 12 tablet 5  . triamcinolone cream (KENALOG) 0.1 % Apply 1 application topically as needed.     No facility-administered medications prior to visit.     ROS: Review of Systems  Constitutional: Negative for appetite change, fatigue and unexpected weight change.  HENT: Negative for congestion, nosebleeds, sneezing, sore throat and trouble swallowing.   Eyes: Negative for itching and visual disturbance.  Respiratory:  Negative for cough.   Cardiovascular: Negative for chest pain, palpitations and leg swelling.  Gastrointestinal: Negative for abdominal distention, blood in stool, diarrhea and nausea.  Genitourinary: Negative for frequency and hematuria.  Musculoskeletal: Negative for back pain, gait problem, joint swelling and neck pain.  Skin: Negative for rash.  Neurological: Negative for dizziness, tremors, speech difficulty and weakness.  Psychiatric/Behavioral: Negative for agitation, dysphoric mood, sleep disturbance and suicidal ideas. The patient is not nervous/anxious.     Objective:  BP 122/82 (BP Location: Left Arm, Patient Position: Sitting, Cuff Size: Normal)   Pulse 68   Temp 98.2 F (36.8 C) (Oral)   Ht 5\' 6"  (1.676 m)   Wt 168 lb (76.2 kg)   SpO2 99%   BMI 27.12 kg/m   BP Readings from Last 3 Encounters:  07/25/18 122/82  05/05/18 (!) 142/78  01/18/18 116/68    Wt Readings from Last 3 Encounters:  07/25/18 168 lb (76.2 kg)  05/05/18 168 lb 12.8 oz (76.6 kg)  01/18/18 167 lb (75.8 kg)    Physical Exam Constitutional:      General: He is not in acute distress.    Appearance: He is well-developed.     Comments: NAD  Eyes:     Conjunctiva/sclera: Conjunctivae normal.     Pupils: Pupils are equal, round, and reactive to light.  Neck:     Musculoskeletal: Normal range of motion.     Thyroid: No thyromegaly.     Vascular: No JVD.  Cardiovascular:     Rate and Rhythm: Normal rate and regular rhythm.     Heart sounds: Normal heart sounds. No murmur. No friction rub. No gallop.   Pulmonary:     Effort: Pulmonary effort is normal. No respiratory distress.     Breath sounds: Normal breath sounds. No wheezing or rales.  Chest:     Chest wall: No tenderness.  Abdominal:     General: Bowel sounds are normal. There is no distension.     Palpations: Abdomen is soft. There is no mass.     Tenderness: There is no abdominal tenderness. There is no guarding or rebound.    Musculoskeletal: Normal range of motion.        General: No tenderness.  Lymphadenopathy:     Cervical: No cervical adenopathy.  Skin:    General: Skin is warm and dry.     Findings: No rash.  Neurological:     Mental Status: He is alert and oriented to person, place, and time.     Cranial Nerves: No cranial nerve deficit.     Motor: No abnormal muscle tone.     Coordination: Coordination normal.     Gait: Gait normal.     Deep Tendon Reflexes: Reflexes are normal and symmetric.  Psychiatric:        Behavior: Behavior normal.        Thought Content: Thought content normal.        Judgment: Judgment normal.   wax L ear   Procedure Note :     Procedure :  Ear irrigation  left ear    Indication:  Cerumen impaction left ear   Risks, including pain, dizziness, eardrum perforation, bleeding, infection and others as well as benefits were explained to the patient in detail. Verbal consent was obtained and the patient agreed to proceed.    We used "The Elephant Ear Irrigation Device" filled with lukewarm water for irrigation. A large amount wax was recovered from [place]. Procedure has also required manual wax removal/instrumentation with an ear wax curette and ear forceps on the  left ear   Tolerated well. Complications: None.   Postprocedure instructions :  Call if problems.    Lab Results  Component Value Date   WBC 6.8 01/16/2018   HGB 14.4 01/16/2018   HCT 43.0 01/16/2018   PLT 122.0 (L) 01/16/2018   GLUCOSE 134 (H) 01/16/2018   CHOL 108 01/16/2018   TRIG 59.0 01/16/2018   HDL 40.20 01/16/2018   LDLCALC 56 01/16/2018   ALT 26 01/16/2018   AST 27 01/16/2018   NA 139 01/16/2018   K 4.7 01/16/2018   CL 103 01/16/2018   CREATININE 1.11 01/16/2018   BUN 16 01/16/2018   CO2 28 01/16/2018   TSH 1.46 01/16/2018   PSA 0.37 01/16/2018   INR 1.1 RATIO (H) 05/16/2008   HGBA1C 6.4 01/16/2018    Vas [place] Abi With/wo Tbi  Result Date: 02/08/2018 LOWER EXTREMITY DOPPLER STUDY  Indications: Peripheral artery disease, and Known aorta-iliac disease; follow-up              PAD. High Risk Factors: Hypertension, past history of smoking, coronary artery                    disease.  Comparison Study: Previous ABI's performed in 02/2016 were 0.99 on  the right and                   0.97 on the left. Performing Technologist: Chesley Noon RVT  Examination Guidelines: A complete evaluation includes at mininum, Doppler waveform signals and systolic blood pressure reading at the level of bilateral brachial, anterior tibial, and posterior tibial arteries, when vessel segments are accessible. Bilateral testing is considered an integral part of a complete examination. Photoelectric Plethysmograph (PPG) waveforms and toe systolic pressure readings are included as required and additional duplex testing as needed. Limited examinations for reoccurring indications may be performed as noted.  ABI Findings: +---------+------------------+-----+--------+--------+ Right    Rt Pressure (mmHg)IndexWaveformComment  +---------+------------------+-----+--------+--------+ Brachial 122                                     +---------+------------------+-----+--------+--------+ ATA      110               0.84 biphasic         +---------+------------------+-----+--------+--------+ PTA      124               0.95 biphasic         +---------+------------------+-----+--------+--------+ PERO     116               0.89 biphasic         +---------+------------------+-----+--------+--------+ Great Toe97                0.74 Abnormal         +---------+------------------+-----+--------+--------+ +---------+------------------+-----+----------------+-------+ Left     Lt Pressure (mmHg)IndexWaveform        Comment +---------+------------------+-----+----------------+-------+ Brachial 131                                            +---------+------------------+-----+----------------+-------+  ATA      120               0.92 biphasic                +---------+------------------+-----+----------------+-------+ PTA      116               0.89 nearly triphasic        +---------+------------------+-----+----------------+-------+ PERO     117               0.89 biphasic                +---------+------------------+-----+----------------+-------+ Great Toe79                0.60 Abnormal                +---------+------------------+-----+----------------+-------+ +-------+-----------+-----------+------------+------------+ ABI/TBIToday's ABIToday's TBIPrevious ABIPrevious TBI +-------+-----------+-----------+------------+------------+ Right  0.95       0.74       0.99        0.70         +-------+-----------+-----------+------------+------------+ Left   0.92       0.60       0.97        0.74         +-------+-----------+-----------+------------+------------+ Right ABIs appear essentially unchanged compared to prior study on 02/10/16. Left ABIs appear mildly decreased compared to prior study on 02/10/16.  Final Interpretation: Right: Resting right ankle-brachial index is within normal range. No evidence  of significant right lower extremity arterial disease. The right toe-brachial index is normal. Left: Resting left ankle-brachial index indicates mild left lower extremity arterial disease. The left toe-brachial index is abnormal.  *See table(s) above for measurements and observations. See aorta-iliac duplex report.  Electronically signed by Larae Grooms MD on 02/08/2018 at 7:24:41 AM.    Final    Vas US Aorta/ivc/iliacs  Result Date: 02/08/2018 ABDOMINAL AORTA STUDY Indications: Aorta/iliac stenosis Risk Factors: Hypertension, past history of smoking, coronary artery disease. Other Factors: Patient states that his legs have been feeling better, he is                working out often and does not need to stop to rest as often as                he did previously.   Comparison Study: Previous visceral duplex performed in 02/2016 showed >50%                   stenosis in the mid/dsital aorta, right common iliac artery                   and left common iliac artery. Performing Technologist: Chesley Noon RVT  Examination Guidelines: A complete evaluation includes B-mode imaging, spectral Doppler, color Doppler, and power Doppler as needed of all accessible portions of each vessel. Bilateral testing is considered an integral part of a complete examination. Limited examinations for reoccurring indications may be performed as noted.  Abdominal Aorta Findings: +-------------+-------+----------+----------+--------+--------+-------------+ Location     AP (cm)Trans (cm)PSV (cm/s)WaveformThrombusComments      +-------------+-------+----------+----------+--------+--------+-------------+ Proximal     1.90   1.90      120       biphasic                      +-------------+-------+----------+----------+--------+--------+-------------+ Mid          1.10   1.10      369       biphasicPresent >50% stenosis +-------------+-------+----------+----------+--------+--------+-------------+ Distal       1.10   1.20      291       biphasicPresent >50% stenosis +-------------+-------+----------+----------+--------+--------+-------------+ RT CIA Prox  0.8    0.8       272       biphasic        >50% stenosis +-------------+-------+----------+----------+--------+--------+-------------+ RT CIA Mid                    154       biphasic                      +-------------+-------+----------+----------+--------+--------+-------------+ RT CIA Distal                 137       biphasic                      +-------------+-------+----------+----------+--------+--------+-------------+ RT EIA Prox  0.8    0.8       139       biphasic                      +-------------+-------+----------+----------+--------+--------+-------------+ RT EIA Mid                     130       biphasic                      +-------------+-------+----------+----------+--------+--------+-------------+  RT EIA Distal                 118       biphasic                      +-------------+-------+----------+----------+--------+--------+-------------+ LT CIA Prox  0.8    0.8       262       biphasic        >50% stenosis +-------------+-------+----------+----------+--------+--------+-------------+ LT CIA Mid                    194       biphasic                      +-------------+-------+----------+----------+--------+--------+-------------+ LT CIA Distal                 145       biphasic                      +-------------+-------+----------+----------+--------+--------+-------------+ LT EIA Prox  0.9    0.9       131       biphasic                      +-------------+-------+----------+----------+--------+--------+-------------+ LT EIA Mid                    118       biphasic                      +-------------+-------+----------+----------+--------+--------+-------------+ LT EIA Distal                 121       biphasic                      +-------------+-------+----------+----------+--------+--------+-------------+  Visualization of the Left EIA Proximal artery was limited. IVC/Iliac Findings: +--------+------+--------+-------+         PatentThrombusComment +--------+------+--------+-------+ IVC Proxpatent                +--------+------+--------+-------+  Final Interpretation: Abdominal Aorta: No evidence of an abdominal aortic aneurysm was visualized. The largest aortic measurement is 1.9 cm. Stenosis: +--------------------+-------------+ Location            Stenosis      +--------------------+-------------+ Mid Aorta           >50% stenosis +--------------------+-------------+ Distal Aorta        >50% stenosis +--------------------+-------------+ Right Common Iliac  >50% stenosis  +--------------------+-------------+ Left Common Iliac   >50% stenosis +--------------------+-------------+ Right External Iliac<50% stenosis +--------------------+-------------+ Left External Iliac <50% stenosis +--------------------+-------------+  *See table(s) above for measurements and observations. See ABI report.  Electronically signed by Larae Grooms MD on 02/08/2018 at 7:27:36 AM.    Final     Assessment & Plan:   There are no diagnoses linked to this encounter.   No orders of the defined types were placed in this encounter.    Follow-up: No follow-ups on file.  Walker Kehr, MD

## 2018-07-25 NOTE — Assessment & Plan Note (Signed)
Viagra - not using

## 2018-08-02 ENCOUNTER — Other Ambulatory Visit (INDEPENDENT_AMBULATORY_CARE_PROVIDER_SITE_OTHER): Payer: PPO

## 2018-08-02 ENCOUNTER — Other Ambulatory Visit: Payer: Self-pay | Admitting: Internal Medicine

## 2018-08-02 DIAGNOSIS — N320 Bladder-neck obstruction: Secondary | ICD-10-CM

## 2018-08-02 DIAGNOSIS — E785 Hyperlipidemia, unspecified: Secondary | ICD-10-CM

## 2018-08-02 DIAGNOSIS — I2510 Atherosclerotic heart disease of native coronary artery without angina pectoris: Secondary | ICD-10-CM

## 2018-08-02 DIAGNOSIS — I10 Essential (primary) hypertension: Secondary | ICD-10-CM | POA: Diagnosis not present

## 2018-08-02 DIAGNOSIS — I2583 Coronary atherosclerosis due to lipid rich plaque: Secondary | ICD-10-CM

## 2018-08-02 DIAGNOSIS — R739 Hyperglycemia, unspecified: Secondary | ICD-10-CM | POA: Diagnosis not present

## 2018-08-02 LAB — BASIC METABOLIC PANEL
BUN: 23 mg/dL (ref 6–23)
CHLORIDE: 104 meq/L (ref 96–112)
CO2: 28 meq/L (ref 19–32)
CREATININE: 0.99 mg/dL (ref 0.40–1.50)
Calcium: 9.7 mg/dL (ref 8.4–10.5)
GFR: 73.94 mL/min (ref >60.00)
Glucose, Bld: 122 mg/dL — ABNORMAL HIGH (ref 70–99)
Potassium: 4.5 mEq/L (ref 3.5–5.1)
Sodium: 141 mEq/L (ref 135–145)

## 2018-08-02 LAB — HEPATIC FUNCTION PANEL
ALBUMIN: 4.5 g/dL (ref 3.5–5.2)
ALK PHOS: 81 U/L (ref 39–117)
ALT: 24 U/L (ref 0–53)
AST: 25 U/L (ref 0–37)
Bilirubin, Direct: 0.1 mg/dL (ref 0.0–0.3)
Total Bilirubin: 0.5 mg/dL (ref 0.2–1.2)
Total Protein: 6.8 g/dL (ref 6.0–8.3)

## 2018-08-02 LAB — LIPID PANEL
CHOLESTEROL: 109 mg/dL (ref 0–200)
HDL: 41.6 mg/dL (ref >39.00)
LDL CALC: 57 mg/dL (ref 0–99)
NonHDL: 67.41
TRIGLYCERIDES: 52 mg/dL (ref 0.0–149.0)
Total CHOL/HDL Ratio: 3
VLDL: 10.4 mg/dL (ref 0.0–40.0)

## 2018-08-02 LAB — URINALYSIS
Bilirubin Urine: NEGATIVE
HGB URINE DIPSTICK: NEGATIVE
KETONES UR: NEGATIVE
Leukocytes, UA: NEGATIVE
Nitrite: NEGATIVE
Specific Gravity, Urine: 1.02 (ref 1.000–1.030)
TOTAL PROTEIN, URINE-UPE24: NEGATIVE
URINE GLUCOSE: NEGATIVE
UROBILINOGEN UA: 0.2 (ref 0.0–1.0)
pH: 6 (ref 5.0–8.0)

## 2018-08-02 LAB — CBC WITH DIFFERENTIAL/PLATELET
BASOS PCT: 0.4 % (ref 0.0–3.0)
Basophils Absolute: 0 10*3/uL (ref 0.0–0.1)
EOS ABS: 0.2 10*3/uL (ref 0.0–0.7)
Eosinophils Relative: 3.3 % (ref 0.0–5.0)
HCT: 43.8 % (ref 39.0–52.0)
Hemoglobin: 14.7 g/dL (ref 13.0–17.0)
LYMPHS ABS: 2.2 10*3/uL (ref 0.7–4.0)
Lymphocytes Relative: 31.6 % (ref 12.0–46.0)
MCHC: 33.5 g/dL (ref 30.0–36.0)
MCV: 89.4 fl (ref 78.0–100.0)
MONO ABS: 0.6 10*3/uL (ref 0.1–1.0)
Monocytes Relative: 7.9 % (ref 3.0–12.0)
NEUTROS ABS: 4 10*3/uL (ref 1.4–7.7)
Neutrophils Relative %: 56.8 % (ref 43.0–77.0)
PLATELETS: 122 10*3/uL — AB (ref 150.0–400.0)
RBC: 4.9 Mil/uL (ref 4.22–5.81)
RDW: 14.1 % (ref 11.5–15.5)
WBC: 7 10*3/uL (ref 4.0–10.5)

## 2018-08-02 LAB — HEMOGLOBIN A1C: Hgb A1c MFr Bld: 6.2 % (ref 4.6–6.5)

## 2018-08-02 LAB — PSA: PSA: 0.57 ng/mL (ref 0.10–4.00)

## 2018-08-02 LAB — TSH: TSH: 1.76 u[IU]/mL (ref 0.35–4.50)

## 2018-09-14 ENCOUNTER — Telehealth: Payer: Self-pay

## 2018-09-14 MED ORDER — OSELTAMIVIR PHOSPHATE 75 MG PO CAPS: 75.0000 mg | ORAL_CAPSULE | Freq: Every day | ORAL | 0 refills | Status: DC

## 2018-09-14 NOTE — Telephone Encounter (Signed)
Okay.  Prescription emailed.  Thanks

## 2018-09-14 NOTE — Telephone Encounter (Signed)
Copied from [place] [id number]. Topic: General - Other >> Sep 14, 2018 12:34 PM Leward Quan A wrote: Reason for CRM: Patient called to say that his granddaughter was dxed with Type B flu on today asking for Tamiflu to be sent to the pharmacy please [id number] - [place], [place] [address] [phone] (Phone) [phone] (Fax)  Please call patient to inform when done at Ph# [phone]

## 2018-11-10 ENCOUNTER — Ambulatory Visit: Payer: PPO | Admitting: Adult Health

## 2019-01-15 ENCOUNTER — Ambulatory Visit: Payer: PPO | Admitting: Adult Health

## 2019-01-24 ENCOUNTER — Encounter: Payer: PPO | Admitting: Internal Medicine

## 2019-02-14 ENCOUNTER — Other Ambulatory Visit: Payer: Self-pay

## 2019-02-14 DIAGNOSIS — I739 Peripheral vascular disease, unspecified: Secondary | ICD-10-CM

## 2019-04-09 ENCOUNTER — Other Ambulatory Visit: Payer: Self-pay

## 2019-04-09 ENCOUNTER — Ambulatory Visit (INDEPENDENT_AMBULATORY_CARE_PROVIDER_SITE_OTHER): Payer: PPO

## 2019-04-09 DIAGNOSIS — Z23 Encounter for immunization: Secondary | ICD-10-CM

## 2019-05-03 ENCOUNTER — Ambulatory Visit (INDEPENDENT_AMBULATORY_CARE_PROVIDER_SITE_OTHER): Payer: PPO | Admitting: Internal Medicine

## 2019-05-03 ENCOUNTER — Encounter: Payer: Self-pay | Admitting: Internal Medicine

## 2019-05-03 DIAGNOSIS — N320 Bladder-neck obstruction: Secondary | ICD-10-CM | POA: Diagnosis not present

## 2019-05-03 MED ORDER — TAMSULOSIN HCL 0.4 MG PO CAPS: 0.4000 mg | ORAL_CAPSULE | Freq: Every day | ORAL | 11 refills | Status: DC

## 2019-05-03 NOTE — Assessment & Plan Note (Signed)
Worse Start Flomax

## 2019-05-03 NOTE — Progress Notes (Signed)
Virtual Visit via Video Note  I connected with Joshua Brown on 05/03/19 at  3:00 PM EDT by a video enabled telemedicine application and verified that I am speaking with the correct person using two identifiers.   I discussed the limitations of evaluation and management by telemedicine and the availability of in person appointments. The patient expressed understanding and agreed to proceed.  History of Present Illness: C/o urinary frequency - worse Waking up at night 3-4 times In the day - going every hour. No dribbling, pain, blood.  There has been no runny nose, cough, chest pain, shortness of breath, abdominal pain, diarrhea, constipation, arthralgias, skin rashes.   Observations/Objective: The patient appears to be in no acute distress, looks well.  Assessment and Plan:  See my Assessment and Plan. Follow Up Instructions:    I discussed the assessment and treatment plan with the patient. The patient was provided an opportunity to ask questions and all were answered. The patient agreed with the plan and demonstrated an understanding of the instructions.   The patient was advised to call back or seek an in-person evaluation if the symptoms worsen or if the condition fails to improve as anticipated.  I provided face-to-face time during this encounter. We were at different locations.   Walker Kehr, MD

## 2019-05-21 ENCOUNTER — Ambulatory Visit (HOSPITAL_BASED_OUTPATIENT_CLINIC_OR_DEPARTMENT_OTHER)
Admission: RE | Admit: 2019-05-21 | Discharge: 2019-05-21 | Disposition: A | Discharge status: Home / Self Care | Payer: PPO | Source: Ambulatory Visit | Attending: Cardiovascular Disease | Admitting: Cardiovascular Disease

## 2019-05-21 ENCOUNTER — Other Ambulatory Visit (HOSPITAL_COMMUNITY): Payer: Self-pay | Admitting: Cardiovascular Disease

## 2019-05-21 ENCOUNTER — Other Ambulatory Visit: Payer: Self-pay

## 2019-05-21 ENCOUNTER — Ambulatory Visit (HOSPITAL_COMMUNITY)
Admission: RE | Admit: 2019-05-21 | Discharge: 2019-05-21 | Disposition: A | Discharge status: Home / Self Care | Payer: PPO | Source: Ambulatory Visit | Attending: Cardiovascular Disease | Admitting: Cardiovascular Disease

## 2019-05-21 DIAGNOSIS — I739 Peripheral vascular disease, unspecified: Secondary | ICD-10-CM | POA: Diagnosis not present

## 2019-05-25 ENCOUNTER — Other Ambulatory Visit: Payer: Self-pay

## 2019-05-28 ENCOUNTER — Encounter: Payer: Self-pay | Admitting: Cardiovascular Disease

## 2019-05-28 ENCOUNTER — Ambulatory Visit: Payer: PPO | Admitting: Cardiovascular Disease

## 2019-05-28 ENCOUNTER — Other Ambulatory Visit: Payer: Self-pay

## 2019-05-28 VITALS — BP 120/80 | HR 66 | Ht 66.75 in | Wt 171.0 lb

## 2019-05-28 DIAGNOSIS — I2510 Atherosclerotic heart disease of native coronary artery without angina pectoris: Secondary | ICD-10-CM

## 2019-05-28 DIAGNOSIS — I700 Atherosclerosis of aorta: Secondary | ICD-10-CM

## 2019-05-28 DIAGNOSIS — I10 Essential (primary) hypertension: Secondary | ICD-10-CM

## 2019-05-28 DIAGNOSIS — E782 Mixed hyperlipidemia: Secondary | ICD-10-CM | POA: Diagnosis not present

## 2019-05-28 MED ORDER — NITROGLYCERIN 0.4 MG SL SUBL: 0.4000 mg | SUBLINGUAL_TABLET | SUBLINGUAL | 5 refills | Status: DC | PRN

## 2019-05-28 NOTE — Progress Notes (Signed)
Cardiology Office Note:    Date:  05/28/2019   ID:  Wille, Fordham 1945-08-06, MRN [id number]  PCP:  Cassandria Anger, MD  Cardiologist:  Sherren Mocha, MD  Electrophysiologist:  None   Referring MD: Cassandria Anger, MD   Chief Complaint  Patient presents with  . Coronary Artery Disease    History of Present Illness:    Joshua Brown is a 73 y.o. male with a hx of coronary artery disease with remote MI at age 53.  The patient underwent multivessel CABG at age 41.  He is followed for peripheral arterial disease with intermittent claudication secondary to distal aortic and bilateral iliac stenoses.  The patient is here alone today.  He has been doing fairly well since his office visit last year.  He has gained a few pounds during the COVID-19 pandemic because he has not been going to the gym on a regular basis.  His weight is still down significantly compared to a few years ago when he was 20 pounds heavier.  He has mild symptoms of leg fatigue and discomfort with walking.  He stops to rest for a few minutes after about one quarter of a mile and then is able to start again.  He walks up and down stairs regularly without significant limitation.  He denies chest pain, heart palpitations, or shortness of breath.  He denies edema, orthopnea, PND, lightheadedness, or syncope.  Past Medical History:  Diagnosis Date  . ABDOMINAL AORTIC ANEURYSM 11/12/2008   has annual U/s  . CAD (coronary artery disease)    premature  . DDD (degenerative disc disease)    lumbar spine  . ED (erectile dysfunction)   . Frequent urination at night    nocturia 2-3  . GERD (gastroesophageal reflux disease)   . Hyperglycemia    Borderline  . Hyperlipidemia    Atherogenic  . Hypertension   . Myocardial infarct, old   . Obesity    Class I  . PVD (peripheral vascular disease) ([place])    ABI June '12 -0.9 bilateral.    Past Surgical History:  Procedure Laterality Date  . colonoscopy     one  month ago  . CORONARY ARTERY BYPASS GRAFT    . TONSILLECTOMY     age 55    Current Medications: Current Meds  Medication Sig  . ALPRAZolam (XANAX) 0.5 MG tablet Take 1 tablet (0.5 mg total) by mouth at bedtime as needed for anxiety.  Marland Kitchen atorvastatin (LIPITOR) 80 MG tablet Take 1 tablet (80 mg total) by mouth daily.  Marland Kitchen b complex vitamins capsule Take 1 capsule by mouth daily.    . Cholecalciferol (VITAMIN D) 2000 UNIT CAPS Take 2,000 Units by mouth daily.   . clopidogrel (PLAVIX) 75 MG tablet Take 1 tablet (75 mg total) by mouth daily.  Marland Kitchen ezetimibe (ZETIA) 10 MG tablet Take 1 tablet (10 mg total) by mouth daily.  Marland Kitchen losartan (COZAAR) 25 MG tablet Take 1 tablet (25 mg total) by mouth daily.  . metoprolol succinate (TOPROL-XL) 50 MG 24 hr tablet Take 1 tablet (50 mg total) by mouth daily. With or immediately following a meal.  . niacin (NIASPAN) 500 MG CR tablet Take 2,000 mg by mouth at bedtime.    . nitroGLYCERIN (NITROSTAT) 0.4 MG SL tablet Place 1 tablet (0.4 mg total) under the tongue every 5 (five) minutes as needed for chest pain.  . pantoprazole (PROTONIX) 40 MG tablet Take 1 tablet (40 mg total) by  mouth daily.  . sildenafil (VIAGRA) 100 MG tablet Take 1 tablet (100 mg total) by mouth as needed for erectile dysfunction.  . tamsulosin (FLOMAX) 0.4 MG CAPS capsule Take 1 capsule (0.4 mg total) by mouth daily after supper.  . triamcinolone cream (KENALOG) 0.1 % Apply 1 application topically as needed.  . [DISCONTINUED] nitroGLYCERIN (NITROSTAT) 0.4 MG SL tablet Place 1 tablet (0.4 mg total) under the tongue every 5 (five) minutes as needed for chest pain.     Allergies:   Patient has no known allergies.   Social History   Socioeconomic History  . Marital status: Married    Spouse name: Not on file  . Number of children: 3  . Years of education: 34  . Highest education level: Not on file  Occupational History  . 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 Financial resource strain: Not on file  . Food insecurity    Worry: Not on file    Inability: Not on file  . Transportation needs    Medical: Not on file    Non-medical: Not on file  Tobacco Use  . Smoking status: Former Smoker    Packs/day: 1.00    Years: 16.00    Pack years: 16.00    Types: Cigarettes    Quit date: 07/05/1969    Years since quitting: 49.9  . Smokeless tobacco: Never Used  Substance and Sexual Activity  . Alcohol use: Yes    Alcohol/week: 2.0 standard drinks    Types: 2 drink(s) per week    Comment: socially  . Drug use: No  . Sexual activity: Yes    Partners: Female  Lifestyle  . Physical activity    Days per week: Not on file    Minutes per session: Not on file  . Stress: Not on file  Relationships  . Social Herbalist on phone: Not on file    Gets together: Not on file    Attends religious service: Not on file    Active member of club or organization: Not on file    Attends meetings of clubs or organizations: Not on file    Relationship status: Not on file  Other Topics Concern  . Not on file  Social History Narrative   HSG, [employer] 1 year. Married 1977. 1 son - 27; 2 daughters - 44, 21; 3  Grandchildren. 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 Marriage in good health. ACP - discussed. Provided packet May '13.     Family History: The patient's family history includes CAD in his father and paternal grandfather; Cancer in his mother; Congestive Heart Failure in his mother; Early death in his father; Heart attack in his father and paternal grandfather; Heart disease in his father and paternal grandfather; Hyperlipidemia in his mother; Prostate cancer in his brother.  ROS:   Please see the history of present illness.    All other systems reviewed and are negative.  EKGs/Labs/Other Studies Reviewed:    The following studies were reviewed today: Aorto-iliac Doppler: Summary:  Abdominal Aorta: The largest aortic measurement is 2.0 cm.  Stenosis: +------------------+-------------+-------------------------------------+ Location          Stenosis     Comments                              +------------------+-------------+-------------------------------------+  Prox Aorta        >50% stenosisVelocity increase; essentially stable +------------------+-------------+-------------------------------------+ Mid Aorta         >50% stenosisVelocity increase; essentially stable +------------------+-------------+-------------------------------------+ Distal Aorta      >50% stenosisVelocity increase; essentially stable +------------------+-------------+-------------------------------------+ Right Common Iliac>50% stenosisVelocity increase; essentially stable +------------------+-------------+-------------------------------------+ Left Common Iliac >50% stenosisVelocity increase; essentially stable +------------------+-------------+-------------------------------------+  IVC/Iliac: Patent IVC.   *See table(s) above for measurements and observations. See ABI report. Suggest follow up study in 12 months.   ABI's: ABI Findings: +---------+------------------+-----+-----------+--------+ Right    Rt Pressure (mmHg)IndexWaveform   Comment  +---------+------------------+-----+-----------+--------+ Brachial 143                                        +---------+------------------+-----+-----------+--------+ ATA      117               0.82 biphasic            +---------+------------------+-----+-----------+--------+ PTA      142               0.99 multiphasic         +---------+------------------+-----+-----------+--------+ PERO     115               0.80 multiphasic         +---------+------------------+-----+-----------+--------+ Great Toe67                0.47 Abnormal             +---------+------------------+-----+-----------+--------+  +---------+------------------+-----+--------+-------+ Left     Lt Pressure (mmHg)IndexWaveformComment +---------+------------------+-----+--------+-------+ Brachial 139                                    +---------+------------------+-----+--------+-------+ ATA      135               0.94 biphasic        +---------+------------------+-----+--------+-------+ PTA      141               0.99 biphasic        +---------+------------------+-----+--------+-------+ PERO     130               0.91 biphasic        +---------+------------------+-----+--------+-------+ Great Toe88                0.62 Abnormal        +---------+------------------+-----+--------+-------+  +-------+-----------+-----------+------------+------------+ ABI/TBIToday's ABIToday's TBIPrevious ABIPrevious TBI +-------+-----------+-----------+------------+------------+ Right  .99        .47        .95         .74          +-------+-----------+-----------+------------+------------+ Left   .99        .62        .92         .60           Right ABIs and left TBIs appear essentially unchanged compared to prior study on 02/07/2018. Left ABIs appear increased compared to prior study on 02/07/2018. Right TBIs appear decreased compared to prior study on 02/07/2018.   Summary: Right: Resting right ankle-brachial index is within normal range. No evidence of significant right lower extremity arterial disease. The right toe-brachial index is abnormal.  Left: Resting left ankle-brachial index is within normal range. No evidence  of significant left lower extremity arterial disease. The left toe-brachial index is abnormal.   *See table(s) above for measurements and observations. See Aortoiliac duplex report. Suggest follow up study in 12 months.  EKG:  EKG is ordered today.  The ekg ordered today demonstrates normal sinus  rhythm 66 bpm, minimal voltage criteria for LVH may be normal variant, T wave abnormality consider inferior ischemia.  Recent Labs: 08/02/2018: ALT 24; BUN 23; Creatinine, Ser 0.99; Hemoglobin 14.7; Platelets 122.0; Potassium 4.5; Sodium 141; TSH 1.76  Recent Lipid Panel    Component Value Date/Time   CHOL 109 08/02/2018 0748   TRIG 52.0 08/02/2018 0748   HDL 41.60 08/02/2018 0748   CHOLHDL 3 08/02/2018 0748   VLDL 10.4 08/02/2018 0748   LDLCALC 57 08/02/2018 0748    Physical Exam:    VS:  BP 120/80   Pulse 66   Ht 5' 6.75" (1.695 m)   Wt 171 lb (77.6 kg)   SpO2 99%   BMI 26.98 kg/m     Wt Readings from Last 3 Encounters:  05/28/19 171 lb (77.6 kg)  07/25/18 168 lb (76.2 kg)  05/05/18 168 lb 12.8 oz (76.6 kg)     GEN:  Well nourished, well developed in no acute distress HEENT: Normal NECK: No JVD; No carotid bruits LYMPHATICS: No lymphadenopathy CARDIAC: RRR, no murmurs, rubs, gallops RESPIRATORY:  Clear to auscultation without rales, wheezing or rhonchi  ABDOMEN: Soft, non-tender, non-distended MUSCULOSKELETAL:  No edema; No deformity  SKIN: Warm and dry NEUROLOGIC:  Alert and oriented x 3 PSYCHIATRIC:  Normal affect   ASSESSMENT:    1. Coronary artery disease involving native coronary artery of native heart without angina pectoris   2. Atherosclerosis of aorta ([place])   3. Mixed hyperlipidemia   4. Essential hypertension    PLAN:    In order of problems listed above:  1. The patient is stable without any symptoms of angina after remote CABG.  He is treated with a high intensity statin drug, clopidogrel, and a beta-blocker. 2. Most recent aortoiliac Doppler and ABIs reviewed with essentially stable findings.  He denies any progressive symptoms and will continue with his walking program.  We discussed details of the walking program today. 3. Lipids have been at goal on a combination of atorvastatin and ezetimibe.  He is also treated with niacin. 4. Blood pressure  is well controlled on losartan and metoprolol.   Medication Adjustments/Labs and Tests Ordered: Current medicines are reviewed at length with the patient today.  Concerns regarding medicines are outlined above.  Orders Placed This Encounter  Procedures  . EKG 12-Lead   Meds ordered this encounter  Medications  . nitroGLYCERIN (NITROSTAT) 0.4 MG SL tablet    Sig: Place 1 tablet (0.4 mg total) under the tongue every 5 (five) minutes as needed for chest pain.    Dispense:  25 tablet    Refill:  5    Patient Instructions  Medication Instructions:  Your provider recommends that you continue on your current medications as directed. Please refer to the Current Medication list given to you today.   *If you need a refill on your cardiac medications before your next appointment, please call your pharmacy*  Testing/Procedures: You will have your ultrasounds scheduled prior to your appointment next year.  Follow-Up: At [facility], you and your health needs are our priority.  As part of our continuing mission to provide you with exceptional heart care, we have created designated Provider Care Teams.  These Care Teams include your primary Cardiologist (physician) and Advanced Practice Providers (APPs -  Physician Assistants and Nurse Practitioners) who all work together to provide you with the care you need, when you need it. Your next appointment:   12 month(s) The format for your next appointment:   In Person Provider:   Sherren Mocha, MD     Signed, Sherren Mocha, MD  05/28/2019 9:08 AM    [place]

## 2019-05-28 NOTE — Patient Instructions (Signed)
Medication Instructions:  Your provider recommends that you continue on your current medications as directed. Please refer to the Current Medication list given to you today.   *If you need a refill on your cardiac medications before your next appointment, please call your pharmacy*  Testing/Procedures: You will have your ultrasounds scheduled prior to your appointment next year.  Follow-Up: At [facility], you and your health needs are our priority.  As part of our continuing mission to provide you with exceptional heart care, we have created designated Provider Care Teams.  These Care Teams include your primary Cardiologist (physician) and Advanced Practice Providers (APPs -  Physician Assistants and Nurse Practitioners) who all work together to provide you with the care you need, when you need it. Your next appointment:   12 month(s) The format for your next appointment:   In Person Provider:   Sherren Mocha, MD

## 2019-05-29 ENCOUNTER — Telehealth: Payer: Self-pay | Admitting: Cardiovascular Disease

## 2019-05-29 NOTE — Telephone Encounter (Signed)
Spoke with Ysidro Evert, pharmacist with [facility].  Gave verbal order for NTG 25 tablets as a "90 day supply" with refills for patient's insurance. He was grateful for call back.

## 2019-05-29 NOTE — Telephone Encounter (Signed)
Jarrett Soho from [facility] is calling regarding the Patient with a question on how many bottles of nitroGLYCERIN (NITROSTAT) 0.4 MG SL tablet to send patient for a 90 day supply. Please advise.

## 2019-06-14 ENCOUNTER — Other Ambulatory Visit: Payer: Self-pay | Admitting: Cardiovascular Disease

## 2019-06-21 NOTE — Progress Notes (Addendum)
Subjective:   Joshua Brown is a 73 y.o. male who presents for Medicare Annual/Subsequent preventive examination.  I connected with patient by a telephone and verified that I am speaking with the correct person using two identifiers. Patient stated full name and DOB. Patient gave permission to continue with telephonic visit. Patient's location was at home and Nurse's location was at [place] office. Participants during this visit included patient and nurse.  Review of Systems:   Cardiac Risk Factors include: advanced age (>30men, >29 women);dyslipidemia;hypertension;male gender Sleep patterns: feels rested on waking, gets up 3-4 times nightly to void and sleeps 7 hours nightly.    Home Safety/Smoke Alarms: Feels safe in home. Smoke alarms in place.  Living environment; residence and Firearm Safety: 1-story house/ trailer.  Seat Belt Safety/Bike Helmet: Wears seat belt.    Objective:    Vitals: There were no vitals taken for this visit.  There is no height or weight on file to calculate BMI.  Advanced Directives 06/22/2019  Does Patient Have a Medical Advance Directive? Yes  Type of Paramedic of [place];Living will  Copy of [place] in Chart? No - copy requested    Tobacco Social History   Tobacco Use  Smoking Status Former Smoker  . Packs/day: 1.00  . Years: 16.00  . Pack years: 16.00  . Types: Cigarettes  . Quit date: 07/05/1969  . Years since quitting: 49.9  Smokeless Tobacco Never Used     Counseling given: Not Answered   Past Medical History:  Diagnosis Date  . ABDOMINAL AORTIC ANEURYSM 11/12/2008   has annual U/s  . CAD (coronary artery disease)    premature  . DDD (degenerative disc disease)   . ED (erectile dysfunction)   . Frequent urination at night    nocturia 2-3  . GERD (gastroesophageal reflux disease)   . Hyperglycemia    Borderline  . Hyperlipidemia    Atherogenic  . Hypertension   . Myocardial  infarct, old   . Obesity    Class I  . PVD (peripheral vascular disease) ([place])    ABI June '12 -0.9 bilateral.   Past Surgical History:  Procedure Laterality Date  . colonoscopy     one month ago  . CORONARY ARTERY BYPASS GRAFT    . TONSILLECTOMY     age 89   Family History  Problem Relation Age of Onset  . Cancer Mother        Jaw Cancer  . Hyperlipidemia Mother   . Congestive Heart Failure Mother   . Early death Father   . Heart disease Father   . CAD Father   . Heart attack Father   . Heart disease Paternal Grandfather   . CAD Paternal Grandfather   . Heart attack Paternal Grandfather   . Prostate cancer Brother    Social History   Socioeconomic History  . Marital status: Married    Spouse name: Not on file  . Number of children: 3  . Years of education: 7  . Highest education level: Not on file  Occupational History  . Occupation: REAL Lexicographer: SELF EMPLOYED    Comment: 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  Tobacco Use  . Smoking status: Former Smoker    Packs/day: 1.00    Years: 16.00    Pack years: 16.00    Types: Cigarettes    Quit date: 07/05/1969    Years since quitting: 49.9  . Smokeless tobacco:  Never Used  Substance and Sexual Activity  . Alcohol use: Yes    Alcohol/week: 2.0 standard drinks    Types: 2 Standard drinks or equivalent per week    Comment: socially  . Drug use: No  . Sexual activity: Yes    Partners: Female  Other Topics Concern  . Not on file  Social History Narrative   HSG, [employer] 1 year. Married 1977. 1 son - 76; 2 daughters - 42, 49; 3  Grandchildren. 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 Marriage in good health. ACP - discussed. Provided packet May '13.   Social Determinants of Health   Financial Resource Strain:   . Difficulty of Paying Living Expenses: Not on file  Food Insecurity:   . Worried About Charity fundraiser in the Last Year: Not on file  . Ran Out of Food in the Last Year: Not on file   Transportation Needs:   . Lack of Transportation (Medical): Not on file  . Lack of Transportation (Non-Medical): Not on file  Physical Activity:   . Days of Exercise per Week: Not on file  . Minutes of Exercise per Session: Not on file  Stress:   . Feeling of Stress : Not on file  Social Connections:   . Frequency of Communication with Friends and Family: Not on file  . Frequency of Social Gatherings with Friends and Family: Not on file  . Attends Religious Services: Not on file  . Active Member of Clubs or Organizations: Not on file  . Attends Archivist Meetings: Not on file  . Marital Status: Not on file    Outpatient Encounter Medications as of 06/22/2019  Medication Sig  . acetaminophen (TYLENOL) 500 MG tablet Take 500 mg by mouth every 6 (six) hours as needed.  . ALPRAZolam (XANAX) 0.5 MG tablet Take 1 tablet (0.5 mg total) by mouth at bedtime as needed for anxiety.  Marland Kitchen atorvastatin (LIPITOR) 80 MG tablet Take 1 tablet (80 mg total) by mouth daily.  Marland Kitchen b complex vitamins capsule Take 1 capsule by mouth daily.    . Cholecalciferol (VITAMIN D) 2000 UNIT CAPS Take 2,000 Units by mouth daily.   . clopidogrel (PLAVIX) 75 MG tablet Take 1 tablet (75 mg total) by mouth daily.  Marland Kitchen ezetimibe (ZETIA) 10 MG tablet Take 1 tablet by mouth every day  . losartan (COZAAR) 25 MG tablet Take 1 tablet by mouth every day  . metoprolol succinate (TOPROL-XL) 50 MG 24 hr tablet Take 1 tablet by mouth every day with or immediately after a meal  . niacin (NIASPAN) 500 MG CR tablet Take 2,000 mg by mouth at bedtime.    . nitroGLYCERIN (NITROSTAT) 0.4 MG SL tablet Place 1 tablet (0.4 mg total) under the tongue every 5 (five) minutes as needed for chest pain.  . pantoprazole (PROTONIX) 40 MG tablet Take 1 tablet (40 mg total) by mouth daily.  . sildenafil (VIAGRA) 100 MG tablet Take 1 tablet (100 mg total) by mouth as needed for erectile dysfunction.  . tamsulosin (FLOMAX) 0.4 MG CAPS capsule Take  1 capsule (0.4 mg total) by mouth daily after supper.  . triamcinolone cream (KENALOG) 0.1 % Apply 1 application topically as needed.   No facility-administered encounter medications on file as of 06/22/2019.    Activities of Daily Living In your present state of health, do you have any difficulty performing the following activities: 06/22/2019  Hearing? N  Vision? N  Difficulty concentrating or making  decisions? N  Walking or climbing stairs? N  Dressing or bathing? N  Doing errands, shopping? N  Preparing Food and eating ? N  Using the Toilet? N  In the past six months, have you accidently leaked urine? N  Do you have problems with loss of bowel control? N  Managing your Medications? N  Managing your Finances? N  Housekeeping or managing your Housekeeping? N  Some recent data might be hidden    Patient Care Team: Plotnikov, Evie Lacks, MD as PCP - General (Internal Medicine) Sherren Mocha, MD as PCP - Cardiology (Cardiology) Richmond Campbell, MD as Consulting Physician (Gastroenterology) Sharyne Peach, MD as Consulting Physician (Ophthalmology) Carolan Clines, MD (Inactive) as Consulting Physician (Urology) Rigoberto Noel, MD as Consulting Physician (Pulmonary Disease)   Assessment:   This is a routine wellness examination for Joshua Brown. Physical assessment deferred to PCP.  Exercise Activities and Dietary recommendations Current Exercise Habits: Home exercise routine, Type of exercise: walking, Time (Minutes): 25, Frequency (Times/Week): 4, Weekly Exercise (Minutes/Week): 100, Intensity: Mild, Exercise limited by: None identified Diet (meal preparation, eat out, water intake, caffeinated beverages, dairy products, fruits and vegetables): in general, a "healthy" diet  , well balanced   Reviewed heart healthy diet. Encouraged patient to increase daily water and healthy fluid intake.    Goals    . Patient Stated     I want to increase my physical activity.         Fall Risk Fall Risk  06/22/2019 01/18/2018 12/10/2016 12/08/2015 11/27/2014  Falls in the past year? 0 No No No No  Number falls in past yr: 0 - - - -  Injury with Fall? 0 - - - -    Depression Screen PHQ 2/9 Scores 06/22/2019 01/18/2018 12/10/2016 12/08/2015  PHQ - 2 Score 0 0 0 0    Cognitive Function       Ad8 score reviewed for issues:  Issues making decisions: no  Less interest in hobbies / activities: no  Repeats questions, stories (family complaining): no  Trouble using ordinary gadgets (microwave, computer, phone):no  Forgets the month or year: no  Mismanaging finances: no  Remembering appts: no  Daily problems with thinking and/or memory: no Ad8 score is= 0  Immunization History  Administered Date(s) Administered  . Fluad Quad(high Dose 65+) 04/09/2019  . Hepatitis A 05/05/2006, 11/23/2006  . Influenza, High Dose Seasonal PF 04/18/2016, 04/25/2017, 04/10/2018  . Pneumococcal Conjugate-13 11/22/2013  . Pneumococcal Polysaccharide-23 11/12/2011  . Td 05/05/2006, 12/17/2013  . Tdap 05/05/2006  . Zoster 01/02/2014   Screening Tests Health Maintenance  Topic Date Due  . COLONOSCOPY  02/22/2023  . TETANUS/TDAP  12/18/2023  . INFLUENZA VACCINE  Completed  . Hepatitis C Screening  Completed  . PNA vac Low Risk Adult  Completed      Plan:    Reviewed health maintenance screenings with patient today and relevant education, vaccines, and/or referrals were provided.   Continue to eat heart healthy diet (full of fruits, vegetables, whole grains, lean protein, water--limit salt, fat, and sugar intake) and increase physical activity as tolerated.  Continue doing brain stimulating activities (puzzles, reading, adult coloring books, staying active) to keep memory sharp.   I have personally reviewed and noted the following in the patient's chart:   . Medical and social history . Use of alcohol, tobacco or illicit drugs  . Current medications and  supplements . Functional ability and status . Nutritional status . Physical activity . Advanced directives . List  of other physicians . Screenings to include cognitive, depression, and falls . Referrals and appointments  In addition, I have reviewed and discussed with patient certain preventive protocols, quality metrics, and best practice recommendations. A written personalized care plan for preventive services as well as general preventive health recommendations were provided to patient.     Michiel Cowboy, RN  06/22/2019  Medical screening examination/treatment/procedure(s) were performed by non-physician practitioner and as supervising physician I was immediately available for consultation/collaboration. I agree with above. Binnie Rail, MD

## 2019-06-22 ENCOUNTER — Ambulatory Visit (INDEPENDENT_AMBULATORY_CARE_PROVIDER_SITE_OTHER): Payer: PPO | Admitting: *Deleted

## 2019-06-22 ENCOUNTER — Telehealth: Payer: Self-pay

## 2019-06-22 ENCOUNTER — Telehealth: Payer: Self-pay | Admitting: *Deleted

## 2019-06-22 DIAGNOSIS — Z0000 Encounter for general adult medical examination without abnormal findings: Secondary | ICD-10-CM

## 2019-06-22 NOTE — Telephone Encounter (Signed)
Copied from [place] [id number]. Topic: General - Inquiry >> Jun 22, 2019  9:57 AM Richardo Priest, NT wrote: Reason for CRM: Pt called in stating he would like to inform Sharee Pimple he would like to try remron. Please advise.

## 2019-06-22 NOTE — Telephone Encounter (Signed)
During AWV, patient stated that the flomax prescription is not working well. He asked if PCP could prescribe another medication to replace flomax and mentioned that finasteride works well for one of his friends.

## 2019-06-27 ENCOUNTER — Ambulatory Visit: Payer: PPO | Attending: Internal Medicine

## 2019-06-27 DIAGNOSIS — Z20828 Contact with and (suspected) exposure to other viral communicable diseases: Secondary | ICD-10-CM | POA: Diagnosis not present

## 2019-06-27 DIAGNOSIS — Z20822 Contact with and (suspected) exposure to covid-19: Secondary | ICD-10-CM

## 2019-06-28 LAB — NOVEL CORONAVIRUS, NAA: SARS-CoV-2, NAA: NOT DETECTED

## 2019-06-28 MED ORDER — FINASTERIDE 5 MG PO TABS: 5.0000 mg | ORAL_TABLET | Freq: Every day | ORAL | 3 refills | Status: DC

## 2019-06-28 NOTE — Telephone Encounter (Signed)
Ok Finasteride Thx

## 2019-07-18 ENCOUNTER — Ambulatory Visit: Payer: PPO | Admitting: *Deleted

## 2019-08-03 ENCOUNTER — Ambulatory Visit: Payer: Medicare PPO | Attending: Internal Medicine

## 2019-08-03 DIAGNOSIS — Z20822 Contact with and (suspected) exposure to covid-19: Secondary | ICD-10-CM

## 2019-08-04 ENCOUNTER — Telehealth: Payer: Self-pay | Admitting: Internal Medicine

## 2019-08-04 LAB — NOVEL CORONAVIRUS, NAA: SARS-CoV-2, NAA: DETECTED — AB

## 2019-08-04 NOTE — Telephone Encounter (Signed)
Copied from [place] [id number]. Topic: General - Other >> Aug 04, 2019  4:20 PM Leward Quan A wrote: Reason for CRM: Patient son called to request that patient get scheduled for a Monoclonal Antibody infusion since he also tested positive for coronavirus. Please contact patient at Ph# [phone]

## 2019-08-05 ENCOUNTER — Telehealth: Payer: Self-pay | Admitting: Unknown Physician Specialty

## 2019-08-05 NOTE — Telephone Encounter (Signed)
Called to discuss with Alben Deeds about Covid symptoms and the use of bamlanivimab, a monoclonal antibody infusion for those with mild to moderate Covid symptoms and at a high risk of hospitalization.    Pt does not qualify for infusion therapy as he has asymptomatic infection. Isolation precautions discussed. Advised to contact back for consideration should he develop symptoms. Patient verbalized understanding.    Patient Active Problem List   Diagnosis Date Noted  . Cerumen impaction 07/25/2018  . Subconjunctival hemorrhage of right eye 01/18/2018  . Erectile dysfunction 07/18/2017  . Actinic keratoses 12/10/2016  . Generalized anxiety disorder 12/08/2015  . Bladder neck obstruction 11/27/2014  . Hypogonadism male 11/27/2014  . Hyperglycemia 11/22/2013  . Well adult exam 11/13/2011  . CAD (coronary artery disease)   . Obstructive sleep apnea 02/02/2010  . ERECTILE DYSFUNCTION 11/18/2008  . Essential hypertension 11/18/2008  . MYOCARDIAL INFARCTION, HX OF 11/18/2008  . TONSILLECTOMY, HX OF 11/18/2008  . CORONARY ARTERY BYPASS GRAFT, HX OF 11/18/2008  . Dyslipidemia 11/12/2008  . Overweight (BMI 25.0-29.9) 11/12/2008  . ABDOMINAL AORTIC ANEURYSM 11/12/2008  . PERIPHERAL VASCULAR DISEASE 11/12/2008  . GERD 11/12/2008  . DEGENERATIVE DISC DISEASE 11/12/2008  . NOCTURIA 11/12/2008

## 2019-08-06 ENCOUNTER — Ambulatory Visit (HOSPITAL_COMMUNITY): Payer: Medicare PPO

## 2019-08-06 ENCOUNTER — Ambulatory Visit (INDEPENDENT_AMBULATORY_CARE_PROVIDER_SITE_OTHER): Payer: Medicare PPO | Admitting: Family

## 2019-08-06 DIAGNOSIS — U071 COVID-19: Secondary | ICD-10-CM

## 2019-08-06 NOTE — Progress Notes (Signed)
Joshua Brown is a 74 y.o. male with the following history as recorded in Epic[facility]:  Patient Active Problem List   Diagnosis Date Noted  . Cerumen impaction 07/25/2018  . Subconjunctival hemorrhage of right eye 01/18/2018  . Erectile dysfunction 07/18/2017  . Actinic keratoses 12/10/2016  . Generalized anxiety disorder 12/08/2015  . Bladder neck obstruction 11/27/2014  . Hypogonadism male 11/27/2014  . Hyperglycemia 11/22/2013  . Well adult exam 11/13/2011  . CAD (coronary artery disease)   . Obstructive sleep apnea 02/02/2010  . ERECTILE DYSFUNCTION 11/18/2008  . Essential hypertension 11/18/2008  . MYOCARDIAL INFARCTION, HX OF 11/18/2008  . TONSILLECTOMY, HX OF 11/18/2008  . CORONARY ARTERY BYPASS GRAFT, HX OF 11/18/2008  . Dyslipidemia 11/12/2008  . Overweight (BMI 25.0-29.9) 11/12/2008  . ABDOMINAL AORTIC ANEURYSM 11/12/2008  . PERIPHERAL VASCULAR DISEASE 11/12/2008  . GERD 11/12/2008  . DEGENERATIVE DISC DISEASE 11/12/2008  . NOCTURIA 11/12/2008    Current Outpatient Medications  Medication Sig Dispense Refill  . acetaminophen (TYLENOL) 500 MG tablet Take 500 mg by mouth every 6 (six) hours as needed.    . ALPRAZolam (XANAX) 0.5 MG tablet Take 1 tablet (0.5 mg total) by mouth at bedtime as needed for anxiety. 90 tablet 3  . atorvastatin (LIPITOR) 80 MG tablet Take 1 tablet (80 mg total) by mouth daily. 90 tablet 3  . b complex vitamins capsule Take 1 capsule by mouth daily.      . Cholecalciferol (VITAMIN D) 2000 UNIT CAPS Take 2,000 Units by mouth daily.     . clopidogrel (PLAVIX) 75 MG tablet Take 1 tablet (75 mg total) by mouth daily. 90 tablet 3  . ezetimibe (ZETIA) 10 MG tablet Take 1 tablet by mouth every day 90 tablet 3  . finasteride (PROSCAR) 5 MG tablet Take 1 tablet (5 mg total) by mouth daily. 100 tablet 3  . losartan (COZAAR) 25 MG tablet Take 1 tablet by mouth every day 90 tablet 3  . metoprolol succinate (TOPROL-XL) 50 MG 24 hr tablet Take 1 tablet by mouth  every day with or immediately after a meal 90 tablet 3  . niacin (NIASPAN) 500 MG CR tablet Take 2,000 mg by mouth at bedtime.      . nitroGLYCERIN (NITROSTAT) 0.4 MG SL tablet Place 1 tablet (0.4 mg total) under the tongue every 5 (five) minutes as needed for chest pain. 25 tablet 5  . pantoprazole (PROTONIX) 40 MG tablet Take 1 tablet (40 mg total) by mouth daily. 90 tablet 3  . sildenafil (VIAGRA) 100 MG tablet Take 1 tablet (100 mg total) by mouth as needed for erectile dysfunction. 12 tablet 5  . tamsulosin (FLOMAX) 0.4 MG CAPS capsule Take 1 capsule (0.4 mg total) by mouth daily after supper. 30 capsule 11  . triamcinolone cream (KENALOG) 0.1 % Apply 1 application topically as needed.     No current facility-administered medications for this visit.    Allergies: Patient has no known allergies.  Past Medical History:  Diagnosis Date  . ABDOMINAL AORTIC ANEURYSM 11/12/2008   has annual U/s  . CAD (coronary artery disease)    premature  . DDD (degenerative disc disease)   . ED (erectile dysfunction)   . Frequent urination at night    nocturia 2-3  . GERD (gastroesophageal reflux disease)   . Hyperglycemia    Borderline  . Hyperlipidemia    Atherogenic  . Hypertension   . Myocardial infarct, old   . Obesity    Class I  .  PVD (peripheral vascular disease) ([place])    ABI June '12 -0.9 bilateral.    Past Surgical History:  Procedure Laterality Date  . colonoscopy     one month ago  . CORONARY ARTERY BYPASS GRAFT    . TONSILLECTOMY     age 30    Family History  Problem Relation Age of Onset  . Cancer Mother        Jaw Cancer  . Hyperlipidemia Mother   . Congestive Heart Failure Mother   . Early death Father   . Heart disease Father   . CAD Father   . Heart attack Father   . Heart disease Paternal Grandfather   . CAD Paternal Grandfather   . Heart attack Paternal Grandfather   . Prostate cancer Brother     Social History   Tobacco Use  . Smoking status: Former  Smoker    Packs/day: 1.00    Years: 16.00    Pack years: 16.00    Types: Cigarettes    Quit date: 07/05/1969    Years since quitting: 50.1  . Smokeless tobacco: Never Used  Substance Use Topics  . Alcohol use: Yes    Alcohol/week: 2.0 standard drinks    Types: 2 Standard drinks or equivalent per week    Comment: socially    Subjective:    I connected with Alben Deeds on 08/06/19 at  9:00 AM EST by a video enabled telemedicine application and verified that I am speaking with the correct person using two identifiers.   I discussed the limitations of evaluation and management by telemedicine and the availability of in person appointments. The patient expressed understanding and agreed to proceed. Provider in office/ patient is at home; provider and patient, patient's wife are only 3 people on video call.   Patient was diagnosed with COVID last Friday- notes he is not having any symptoms. He received his first COVID vaccine on January 19 and is wondering if the test could be a false positive due to reaction to the vaccine. He notes one of his children did test positive for the virus but wife's test was negative. He is also questioning if it is safe to get his second COVID vaccine on February 9; his 10 days of quarantine will be up on August 12, 2019.    Objective:  There were no vitals filed for this visit.  General: Well developed, well nourished, in no acute distress  Head: Normocephalic and atraumatic  Lungs: Respirations unlabored;  Neurologic: Alert and oriented; speech intact; face symmetrical;   Assessment:  1. COVID-19 virus infection     Plan:  Patient is asymptomatic and notes he has no fever, feeling very good but understands need to remain quarantined for entire 10 days; Reviewed CDC literature that is 1st vaccine did not cause a false positive on his COVID test; reviewed CDC literature that says it is okay to get 2nd COVID vaccine as long as asymptomatic and outside of  isolation window; so as long as patient remains asymptomatic, he will be out of quarantine on 2/7 and eligible for vaccine on 08/14/19. Patient and wife both expressed understanding.   No follow-ups on file.  No orders of the defined types were placed in this encounter.   Requested Prescriptions    No prescriptions requested or ordered in this encounter

## 2019-08-06 NOTE — Telephone Encounter (Signed)
Kathrine Haddock, NP      08/05/19 12:53 PM Note   Called to discuss with Alben Deeds about Covid symptoms and the use of bamlanivimab, a monoclonal antibody infusion for those with mild to moderate Covid symptoms and at a high risk of hospitalization.     Pt does not qualify for infusion therapy as he has asymptomatic infection. Isolation precautions discussed. Advised to contact back for consideration should he develop symptoms. Patient verbalized understanding.

## 2019-08-08 ENCOUNTER — Ambulatory Visit: Payer: Medicare PPO

## 2019-08-24 ENCOUNTER — Other Ambulatory Visit: Payer: Self-pay | Admitting: Cardiovascular Disease

## 2019-08-24 MED ORDER — PANTOPRAZOLE SODIUM 40 MG PO TBEC: 40.0000 mg | DELAYED_RELEASE_TABLET | Freq: Every day | ORAL | 2 refills | Status: DC

## 2019-08-24 MED ORDER — CLOPIDOGREL BISULFATE 75 MG PO TABS: 75.0000 mg | ORAL_TABLET | Freq: Every day | ORAL | 2 refills | Status: DC

## 2019-08-24 MED ORDER — EZETIMIBE 10 MG PO TABS: 10.0000 mg | ORAL_TABLET | Freq: Every day | ORAL | 2 refills | Status: DC

## 2019-08-24 MED ORDER — ATORVASTATIN CALCIUM 80 MG PO TABS: 80.0000 mg | ORAL_TABLET | Freq: Every day | ORAL | 2 refills | Status: DC

## 2019-08-31 ENCOUNTER — Other Ambulatory Visit: Payer: Self-pay

## 2019-08-31 MED ORDER — PANTOPRAZOLE SODIUM 40 MG PO TBEC: 40.0000 mg | DELAYED_RELEASE_TABLET | Freq: Every day | ORAL | 2 refills | Status: DC

## 2019-08-31 MED ORDER — ATORVASTATIN CALCIUM 80 MG PO TABS: 80.0000 mg | ORAL_TABLET | Freq: Every day | ORAL | 2 refills | Status: DC

## 2019-08-31 MED ORDER — EZETIMIBE 10 MG PO TABS: 10.0000 mg | ORAL_TABLET | Freq: Every day | ORAL | 2 refills | Status: DC

## 2019-09-12 ENCOUNTER — Other Ambulatory Visit: Payer: Self-pay

## 2019-09-12 MED ORDER — LOSARTAN POTASSIUM 25 MG PO TABS: 25.0000 mg | ORAL_TABLET | Freq: Every day | ORAL | 2 refills | Status: DC

## 2019-09-12 MED ORDER — METOPROLOL SUCCINATE ER 50 MG PO TB24: ORAL_TABLET | ORAL | 2 refills | Status: DC

## 2019-09-26 ENCOUNTER — Ambulatory Visit: Payer: Medicare PPO | Admitting: Dermatology

## 2019-09-26 ENCOUNTER — Other Ambulatory Visit: Payer: Self-pay

## 2019-09-26 ENCOUNTER — Encounter: Payer: Self-pay | Admitting: Dermatology

## 2019-09-26 DIAGNOSIS — D1801 Hemangioma of skin and subcutaneous tissue: Secondary | ICD-10-CM | POA: Diagnosis not present

## 2019-09-26 DIAGNOSIS — L821 Other seborrheic keratosis: Secondary | ICD-10-CM

## 2019-09-26 DIAGNOSIS — D485 Neoplasm of uncertain behavior of skin: Secondary | ICD-10-CM | POA: Diagnosis not present

## 2019-09-26 DIAGNOSIS — D229 Melanocytic nevi, unspecified: Secondary | ICD-10-CM

## 2019-09-26 DIAGNOSIS — Z1283 Encounter for screening for malignant neoplasm of skin: Secondary | ICD-10-CM

## 2019-09-26 DIAGNOSIS — D492 Neoplasm of unspecified behavior of bone, soft tissue, and skin: Secondary | ICD-10-CM

## 2019-09-26 NOTE — Progress Notes (Addendum)
   Follow-Up Visit   Subjective  Joshua Brown is a 74 y.o. male who presents for the following: Annual Exam (Patient here for annual skin exam.  He is concerned with scab on his scalp.  Admits to picking it off a couple months ago.  Has returned since then.).  Growths and moles Location: Newest on crown of scalp Duration: Several months Quality: Enlarging Associated Signs/Symptoms: Modifying Factors:  Severity:  Timing: Context: Wife concerned  The following portions of the chart were reviewed this encounter and updated as appropriate:     Objective  Well appearing patient in no apparent distress; mood and affect are within normal limits.  All skin waist up examined.   Assessment & Plan  Neoplasm of skin Mid Occipital Scalp  Skin / nail biopsy Type of biopsy: tangential   Informed consent: discussed and consent obtained   Anesthesia: the lesion was anesthetized in a standard fashion   Anesthetic:  1% lidocaine w/ epinephrine 1-100,000 local infiltration Instrument used: flexible razor blade   Hemostasis achieved with: ferric subsulfate   Outcome: patient tolerated procedure well   Post-procedure details: sterile dressing applied and wound care instructions given   Dressing type: petrolatum   Additional details:  Patient identified lesion of concern.  Lesion identified by physician.  Specimen 1 - Surgical pathology Differential Diagnosis: BCC vs SCC Check Margins: No  Nevus (2) Right Upper Arm - Anterior; Right Upper Arm - Posterior  Tangential biopsy.  Skin / nail biopsy - Right Upper Arm - Anterior Type of biopsy: tangential   Informed consent: discussed and consent obtained   Timeout: patient name, date of birth, surgical site, and procedure verified   Procedure prep:  Patient was prepped and draped in usual sterile fashion Prep type:  Isopropyl alcohol Anesthesia: the lesion was anesthetized in a standard fashion   Anesthetic:  1% lidocaine w/  epinephrine 1-100,000 buffered w/ 8.4% NaHCO3 Instrument used: flexible razor blade   Hemostasis achieved with: pressure, aluminum chloride and electrodesiccation   Outcome: patient tolerated procedure well   Post-procedure details: sterile dressing applied and wound care instructions given   Dressing type: bandage and petrolatum    Specimen 2 - Surgical pathology Differential Diagnosis: atypia Check Margins: No  Seborrheic keratosis Mid Parietal Scalp  reassure Joshua Brown returns for general skin check.  His wife has noticed enlargement in a brown textured spot on the right scalp.  Clinically abdomen dermatoscopically this is a benign keratosis.  He has multiple seborrheic keratoses on the scalp of the torso; these require no intervention.  Multiple cherry angiomas on the abdomen need no treatment.  There is a 2 toned 5 mm macule below the right neck which is normal on dermoscopy.  There is a 3 mm Liguori-black macule on the right upper inner arm that does show atypical dermoscopic features; this was tangentially biopsied.  Likewise a thick hornlike 6 mm lesion on the left crown may represent an early squamous cell carcinoma and was biopsied.  Joshua Brown will check on these results on MyChart in 2 days and call to discuss things.  He has my home phone number and knows he can contact me if there are any issues related to the biopsy.

## 2019-09-26 NOTE — Patient Instructions (Signed)

## 2019-10-18 ENCOUNTER — Telehealth: Payer: Self-pay

## 2019-10-18 NOTE — Telephone Encounter (Signed)
Last filled in 2019, please advise

## 2019-10-18 NOTE — Telephone Encounter (Signed)
1.Medication Requested:ALPRAZolam (XANAX) 0.5 MG tablet  2. Pharmacy (Name, [facility], [place], [place]  3. On Med List: Yes   4. Last Visit with PCP: 10.29.20   5. Next visit date with PCP: patient is not ready to make an appt just called this medication in.    Agent: Please be advised that RX refills may take up to 3 business days. We ask that you follow-up with your pharmacy.

## 2019-10-19 ENCOUNTER — Other Ambulatory Visit: Payer: Self-pay | Admitting: Internal Medicine

## 2019-10-19 MED ORDER — ALPRAZOLAM 0.5 MG PO TABS: 0.5000 mg | ORAL_TABLET | Freq: Every evening | ORAL | 3 refills | Status: DC | PRN

## 2019-10-19 NOTE — Telephone Encounter (Signed)
OK. Thx

## 2019-10-30 ENCOUNTER — Telehealth: Payer: Self-pay | Admitting: *Deleted

## 2019-10-30 NOTE — Telephone Encounter (Signed)
   [facility] Pre-operative Risk Assessment    Request for surgical clearance:  1. What type of surgery is being performed? 1 TOOTH SURGICALLY EXTRACTED    2. When is this surgery scheduled? 11/14/19   3. What type of clearance is required (medical clearance vs. Pharmacy clearance to hold med vs. Both)? MEDICAL  4. Are there any medications that need to be held prior to surgery and how long? PLAVIX   5. Practice name and name of physician performing surgery? [facility] [place], [place]   6. What is your office phone number [phone]    7.   What is your office fax number [phone]  8.   Anesthesia type (None, local, MAC, general) ? LOCAL   Julaine Hua 10/30/2019, 4:26 PM  _________________________________________________________________   (provider comments below)

## 2019-10-31 NOTE — Telephone Encounter (Signed)
   Primary Cardiologist: Sherren Mocha, MD  Chart reviewed and patient contacted by phone today as part of pre-operative protocol coverage. Given past medical history and time since last visit, based on ACC/AHA guidelines, AMANDA DURANGO would be at acceptable risk for the planned procedure without further cardiovascular testing.   Based on prior procedures OK to hold Plavix 5 days pre op if needed.   I will route this recommendation to the requesting party via Epic fax function and remove from pre-op pool.  Please call with questions.  Kerin Ransom, PA-C 10/31/2019, 9:52 AM

## 2020-04-29 ENCOUNTER — Telehealth: Payer: Self-pay | Admitting: Cardiovascular Disease

## 2020-04-29 NOTE — Telephone Encounter (Signed)
° ° °  Pt said he's returning call from [facility]

## 2020-04-29 NOTE — Telephone Encounter (Signed)
Rescheduled patient for visit with Dr. Burt Knack 11/24. He was grateful for call and agrees with plan.

## 2020-04-29 NOTE — Telephone Encounter (Signed)
Left message to call back  

## 2020-04-30 ENCOUNTER — Telehealth: Payer: Self-pay | Admitting: Family

## 2020-04-30 NOTE — Telephone Encounter (Signed)
Please call and re-schedule his CPE to see Dr. Alain Marion. He needs to see his own PCP for CPE; Dr. Quay Burow did his CPE in 06/2019 so he's technically too early anyway.

## 2020-04-30 NOTE — Telephone Encounter (Signed)
Called pt to have CPE rescheduled for 06/21/20 or after with Dr Alain Marion.

## 2020-05-02 ENCOUNTER — Ambulatory Visit: Payer: Medicare PPO | Admitting: Family

## 2020-05-05 NOTE — Telephone Encounter (Signed)
Reviewed chart: CPE rescheduled for 07/08/20

## 2020-05-21 ENCOUNTER — Ambulatory Visit (HOSPITAL_BASED_OUTPATIENT_CLINIC_OR_DEPARTMENT_OTHER)
Admission: RE | Admit: 2020-05-21 | Discharge: 2020-05-21 | Disposition: A | Discharge status: Home / Self Care | Payer: Medicare PPO | Source: Ambulatory Visit | Attending: Cardiovascular Disease | Admitting: Cardiovascular Disease

## 2020-05-21 ENCOUNTER — Other Ambulatory Visit (HOSPITAL_COMMUNITY): Payer: Self-pay | Admitting: Cardiovascular Disease

## 2020-05-21 ENCOUNTER — Ambulatory Visit (HOSPITAL_COMMUNITY)
Admission: RE | Admit: 2020-05-21 | Discharge: 2020-05-21 | Disposition: A | Discharge status: Home / Self Care | Payer: Medicare PPO | Source: Ambulatory Visit | Attending: Cardiology | Admitting: Cardiology

## 2020-05-21 DIAGNOSIS — I739 Peripheral vascular disease, unspecified: Secondary | ICD-10-CM | POA: Insufficient documentation

## 2020-05-21 DIAGNOSIS — I700 Atherosclerosis of aorta: Secondary | ICD-10-CM | POA: Diagnosis not present

## 2020-05-23 ENCOUNTER — Ambulatory Visit: Payer: Medicare PPO | Admitting: Cardiovascular Disease

## 2020-05-28 ENCOUNTER — Other Ambulatory Visit: Payer: Self-pay

## 2020-05-28 ENCOUNTER — Ambulatory Visit: Payer: Medicare PPO | Admitting: Cardiovascular Disease

## 2020-05-28 ENCOUNTER — Encounter: Payer: Self-pay | Admitting: Cardiovascular Disease

## 2020-05-28 VITALS — BP 110/60 | HR 65 | Ht 67.0 in | Wt 167.0 lb

## 2020-05-28 DIAGNOSIS — E782 Mixed hyperlipidemia: Secondary | ICD-10-CM

## 2020-05-28 DIAGNOSIS — I2510 Atherosclerotic heart disease of native coronary artery without angina pectoris: Secondary | ICD-10-CM

## 2020-05-28 DIAGNOSIS — I10 Essential (primary) hypertension: Secondary | ICD-10-CM | POA: Diagnosis not present

## 2020-05-28 DIAGNOSIS — I700 Atherosclerosis of aorta: Secondary | ICD-10-CM | POA: Diagnosis not present

## 2020-05-28 NOTE — Progress Notes (Signed)
Cardiology Office Note:    Date:  05/28/2020   ID:  Joshua Brown 1946-02-24, MRN [id number]  PCP:  Joshua Anger, MD  [facility] Cardiologist:  Joshua Mocha, MD  [place] Electrophysiologist:  None   Referring MD: Joshua Anger, MD   Chief Complaint  Patient presents with  . Leg Pain    History of Present Illness:    Joshua Brown is a 74 y.o. male with a hx of coronary artery disease with remote MI at age 50.  The patient underwent multivessel CABG at age 51.  He is followed for peripheral arterial disease with intermittent claudication secondary to distal aortic and bilateral iliac stenoses.  He undergoes annual aortoiliac duplex studies and ABIs which have been stable over time.  He has not had any stress testing in some time because of good functional capacity with no cardiac symptoms.  The patient is here alone today. He was doing very well with regular exercise until about one month ago. However, he has developed problems with pain and proximal muscle weakness in his legs. He noted that he had to use his arms to give him a push up out of a chair. Also reports pain in both groin/hip joints. No numbness or tingling down the legs. He denies calf claudication. He hasn't been doing as much walking as in the past. He denies fever/chills/systemic symptoms. No CP, dyspnea, edema, palpitations. Otherwise feels very well. He has started taking naproxen and leg symptoms have improved significantly since then.   Past Medical History:  Diagnosis Date  . ABDOMINAL AORTIC ANEURYSM 11/12/2008   has annual U/s  . Atypical mole 03/08/2018   mild atypia on R lower abdomen - no treatment  . CAD (coronary artery disease)    premature  . DDD (degenerative disc disease)   . ED (erectile dysfunction)   . Frequent urination at night    nocturia 2-3  . GERD (gastroesophageal reflux disease)   . Hyperglycemia    Borderline  . Hyperlipidemia    Atherogenic  .  Hypertension   . Myocardial infarct, old   . Obesity    Class I  . PVD (peripheral vascular disease) ([place])    ABI June '12 -0.9 bilateral.    Past Surgical History:  Procedure Laterality Date  . colonoscopy     one month ago  . CORONARY ARTERY BYPASS GRAFT    . TONSILLECTOMY     age 21    Current Medications: Current Meds  Medication Sig  . acetaminophen (TYLENOL) 500 MG tablet Take 500 mg by mouth every 6 (six) hours as needed.  . ALPRAZolam (XANAX) 0.5 MG tablet Take 1 tablet (0.5 mg total) by mouth at bedtime as needed for anxiety.  Marland Kitchen atorvastatin (LIPITOR) 80 MG tablet Take 1 tablet (80 mg total) by mouth daily.  Marland Kitchen b complex vitamins capsule Take 1 capsule by mouth daily.    . Cholecalciferol (VITAMIN D) 2000 UNIT CAPS Take 2,000 Units by mouth daily.   . clopidogrel (PLAVIX) 75 MG tablet Take 1 tablet (75 mg total) by mouth daily.  Marland Kitchen ezetimibe (ZETIA) 10 MG tablet Take 1 tablet (10 mg total) by mouth daily.  . Glucosamine-Chondroit-Vit C-Mn (GLUCOSAMINE CHONDR 1500 COMPLX PO) Take by mouth.  . losartan (COZAAR) 25 MG tablet Take 1 tablet (25 mg total) by mouth daily.  . metoprolol succinate (TOPROL-XL) 50 MG 24 hr tablet Take 1 tablet by mouth every day with or immediately after a  meal  . naproxen (NAPROSYN) 500 MG tablet Take 500 mg by mouth 2 (two) times daily with a meal.  . niacin (NIASPAN) 500 MG CR tablet Take 2,000 mg by mouth at bedtime.    . nitroGLYCERIN (NITROSTAT) 0.4 MG SL tablet Place 1 tablet (0.4 mg total) under the tongue every 5 (five) minutes as needed for chest pain.  . pantoprazole (PROTONIX) 40 MG tablet Take 1 tablet (40 mg total) by mouth daily.  . sildenafil (VIAGRA) 100 MG tablet Take 1 tablet (100 mg total) by mouth as needed for erectile dysfunction.  . triamcinolone cream (KENALOG) 0.1 % Apply 1 application topically as needed.     Allergies:   Patient has no known allergies.   Social History   Socioeconomic History  . Marital status:  Married    Spouse name: Not on file  . Number of children: 3  . Years of education: 2  . Highest education level: Not on file  Occupational History  . Occupation: REAL Lexicographer: SELF EMPLOYED    Comment: 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  Tobacco Use  . Smoking status: Former Smoker    Packs/day: 1.00    Years: 16.00    Pack years: 16.00    Types: Cigarettes    Quit date: 07/05/1969    Years since quitting: 50.9  . Smokeless tobacco: Never Used  Vaping Use  . Vaping Use: Never used  Substance and Sexual Activity  . Alcohol use: Yes    Alcohol/week: 2.0 standard drinks    Types: 2 Standard drinks or equivalent per week    Comment: socially  . Drug use: No  . Sexual activity: Yes    Partners: Female  Other Topics Concern  . Not on file  Social History Narrative   HSG, [employer] 1 year. Married 1977. 1 son - 42; 2 daughters - 53, 79; 3  Grandchildren. 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 Marriage in good health. ACP - discussed. Provided packet May '13.   Social Determinants of Health   Financial Resource Strain:   . Difficulty of Paying Living Expenses: Not on file  Food Insecurity:   . Worried About Charity fundraiser in the Last Year: Not on file  . Ran Out of Food in the Last Year: Not on file  Transportation Needs:   . Lack of Transportation (Medical): Not on file  . Lack of Transportation (Non-Medical): Not on file  Physical Activity:   . Days of Exercise per Week: Not on file  . Minutes of Exercise per Session: Not on file  Stress:   . Feeling of Stress : Not on file  Social Connections:   . Frequency of Communication with Friends and Family: Not on file  . Frequency of Social Gatherings with Friends and Family: Not on file  . Attends Religious Services: Not on file  . Active Member of Clubs or Organizations: Not on file  . Attends Archivist Meetings: Not on file  . Marital Status: Not on file     Family History: The patient's  family history includes CAD in his father and paternal grandfather; Cancer in his mother; Congestive Heart Failure in his mother; Early death in his father; Heart attack in his father and paternal grandfather; Heart disease in his father and paternal grandfather; Hyperlipidemia in his mother; Prostate cancer in his brother.  ROS:   Please see the history of present illness.    Positive for  nocturia. All other systems reviewed and are negative.  EKGs/Labs/Other Studies Reviewed:    The following studies were reviewed today: ABI's 05/21/2020: ABI Findings:  +---------+------------------+-----+---------+--------+  Right  Rt Pressure (mmHg)IndexWaveform Comment   +---------+------------------+-----+---------+--------+  Brachial 152                      +---------+------------------+-----+---------+--------+  ATA   135        0.89 triphasic      +---------+------------------+-----+---------+--------+  PTA   144        0.95 triphasic      +---------+------------------+-----+---------+--------+  PERO   114        0.75 triphasic      +---------+------------------+-----+---------+--------+  Great Toe89        0.59 Abnormal       +---------+------------------+-----+---------+--------+   +---------+------------------+-----+---------+-------+  Left   Lt Pressure (mmHg)IndexWaveform Comment  +---------+------------------+-----+---------+-------+  Brachial 142                      +---------+------------------+-----+---------+-------+  ATA   101        0.66 triphasic      +---------+------------------+-----+---------+-------+  PTA   129        0.85 triphasic      +---------+------------------+-----+---------+-------+  PERO   122        0.80 biphasic         +---------+------------------+-----+---------+-------+  Esau Grew        0.55 Abnormal       +---------+------------------+-----+---------+-------+   +-------+-----------+-----------+------------+------------+  ABI/TBIToday's ABIToday's TBIPrevious ABIPrevious TBI  +-------+-----------+-----------+------------+------------+  Right 0.95    0.59    0.99    0.47      +-------+-----------+-----------+------------+------------+  Left  0.85    0.55    0.99    0.62      +-------+-----------+-----------+------------+------------+       Left ABIs appear decreased compared to prior study on 05/21/19.    Summary:  Right: Resting right ankle-brachial index is within normal range. No  evidence of significant right lower extremity arterial disease. The right  toe-brachial index is abnormal.   Left: Resting left ankle-brachial index indicates mild left lower  extremity arterial disease. The left toe-brachial index is abnormal.   Lower extremity vascular study 05/21/2020: Summary:  Abdominal Aorta: The largest aortic measurement is 1.6 cm.  Stenosis: +--------------------+-------------+  Location      Stenosis     +--------------------+-------------+  Distal Aorta    >50% stenosis  +--------------------+-------------+  Right Common Iliac >50% stenosis  +--------------------+-------------+  Left Common Iliac  <50% stenosis  +--------------------+-------------+  Right External Iliac<50% stenosis  +--------------------+-------------+  Left External Iliac <50% stenosis  +--------------------+-------------+   Atherosclerosis in the aorta and iliac arteries. Elevated velocities in  the aorta and iliac arteries. Essentially stable compared to prior exam.   IVC/Iliac: There is no evidence of thrombus involving the IVC.    *See table(s) above for measurements and observations.  Suggest  follow up study in 12 months.   EKG:  EKG is ordered today.  The ekg ordered today demonstrates normal sinus rhythm 65 bpm, ST/T wave abnormality consider inferior ischemia, no change from prior tracings.  Recent Labs: No results found for requested labs within last 8760 hours.  Recent Lipid Panel    Component Value Date/Time   CHOL 109 08/02/2018 0748   TRIG 52.0 08/02/2018 0748   HDL 41.60 08/02/2018 0748   CHOLHDL 3 08/02/2018 0748   VLDL 10.4 08/02/2018 0748   LDLCALC  57 08/02/2018 0748     Risk Assessment/Calculations:       Physical Exam:    VS:  BP 110/60   Pulse 65   Ht 5\' 7"  (1.702 m)   Wt 167 lb (75.8 kg)   SpO2 99%   BMI 26.16 kg/m     Wt Readings from Last 3 Encounters:  05/28/20 167 lb (75.8 kg)  05/28/19 171 lb (77.6 kg)  07/25/18 168 lb (76.2 kg)     GEN:  Well nourished, well developed in no acute distress HEENT: Normal NECK: No JVD; No carotid bruits LYMPHATICS: No lymphadenopathy CARDIAC: RRR, no murmurs, rubs, gallops RESPIRATORY:  Clear to auscultation without rales, wheezing or rhonchi  ABDOMEN: Soft, non-tender, non-distended MUSCULOSKELETAL:  No edema; No deformity  SKIN: Warm and dry NEUROLOGIC:  Alert and oriented x 3 PSYCHIATRIC:  Normal affect   ASSESSMENT:    1. Coronary artery disease involving native coronary artery of native heart without angina pectoris   2. Mixed hyperlipidemia   3. Atherosclerosis of aorta ([place])   4. Essential hypertension    PLAN:    In order of problems listed above:  1. Clinically stable, has been on clopidogrel, atorvastatin, losartan, metoprolol succinate without any ischemic symptoms.  Continue current management. 2. Treated with a high intensity statin drug as well as ezetimibe.  Last LDL cholesterol was 57 mg/dL.  He is due for repeat labs. 3. Recent ABIs/duplex reviewed.  Everything appears stable and I do not think his leg symptoms are related to arterial insufficiency.  Since he is treated  with a statin drug and is exhibiting symptoms of pain and proximal muscle weakness, will make sure that there is no evidence of rhabdomyolysis.  We will check a CK, sed rate, as well as his annual blood work (CBC, metabolic panel, lipid panel) 4. Well-controlled blood pressure on losartan and metoprolol succinate.    Shared Decision Making/Informed Consent        Medication Adjustments/Labs and Tests Ordered: Current medicines are reviewed at length with the patient today.  Concerns regarding medicines are outlined above.  Orders Placed This Encounter  Procedures  . CBC with Differential/Platelet  . Comprehensive metabolic panel  . Lipid panel  . CK (Creatine Kinase)  . Sedimentation rate  . TSH  . HgB A1c  . EKG 12-Lead   No orders of the defined types were placed in this encounter.   Patient Instructions  Medication Instructions:  Your provider recommends that you continue on your current medications as directed. Please refer to the Current Medication list given to you today.   *If you need a refill on your cardiac medications before your next appointment, please call your pharmacy*  Lab Work: Please return for FASTING blood work. If you have labs (blood work) drawn today and your tests are completely normal, you will receive your results only by: Marland Kitchen MyChart Message (if you have MyChart) OR . A paper copy in the mail If you have any lab test that is abnormal or we need to change your treatment, we will call you to review the results.  Follow-Up: At [facility], you and your health needs are our priority.  As part of our continuing mission to provide you with exceptional heart care, we have created designated Provider Care Teams.  These Care Teams include your primary Cardiologist (physician) and Advanced Practice Providers (APPs -  Physician Assistants and Nurse Practitioners) who all work together to provide you with the care you need, when  you need it. Your next  appointment:   12 month(s) The format for your next appointment:   In Person Provider:   You may see Joshua Mocha, MD or one of the following Advanced Practice Providers on your designated Care Team:    Richardson Dopp, PA-C  Robbie Lis, [place]      Signed, Joshua Mocha, MD  05/28/2020 1:48 PM    [place]

## 2020-05-28 NOTE — Patient Instructions (Signed)
Medication Instructions:  Your provider recommends that you continue on your current medications as directed. Please refer to the Current Medication list given to you today.   *If you need a refill on your cardiac medications before your next appointment, please call your pharmacy*  Lab Work: Please return for FASTING blood work. If you have labs (blood work) drawn today and your tests are completely normal, you will receive your results only by:  [place] (if you have MyChart) OR  A paper copy in the mail If you have any lab test that is abnormal or we need to change your treatment, we will call you to review the results.  Follow-Up: At [facility], you and your health needs are our priority.  As part of our continuing mission to provide you with exceptional heart care, we have created designated Provider Care Teams.  These Care Teams include your primary Cardiologist (physician) and Advanced Practice Providers (APPs -  Physician Assistants and Nurse Practitioners) who all work together to provide you with the care you need, when you need it. Your next appointment:   12 month(s) The format for your next appointment:   In Person Provider:   You may see Sherren Mocha, MD or one of the following Advanced Practice Providers on your designated Care Team:    Richardson Dopp, PA-C  Vin Kosse, [place]

## 2020-06-02 ENCOUNTER — Other Ambulatory Visit: Payer: Medicare PPO | Admitting: *Deleted

## 2020-06-02 ENCOUNTER — Other Ambulatory Visit: Payer: Self-pay

## 2020-06-02 DIAGNOSIS — I10 Essential (primary) hypertension: Secondary | ICD-10-CM

## 2020-06-02 DIAGNOSIS — E782 Mixed hyperlipidemia: Secondary | ICD-10-CM

## 2020-06-02 DIAGNOSIS — I2510 Atherosclerotic heart disease of native coronary artery without angina pectoris: Secondary | ICD-10-CM

## 2020-06-02 DIAGNOSIS — I700 Atherosclerosis of aorta: Secondary | ICD-10-CM

## 2020-06-03 LAB — CBC WITH DIFFERENTIAL/PLATELET
Basophils Absolute: 0 10*3/uL (ref 0.0–0.2)
Basos: 1 %
EOS (ABSOLUTE): 0.3 10*3/uL (ref 0.0–0.4)
Eos: 3 %
Hematocrit: 44.5 % (ref 37.5–51.0)
Hemoglobin: 14.7 g/dL (ref 13.0–17.7)
Immature Grans (Abs): 0 10*3/uL (ref 0.0–0.1)
Immature Granulocytes: 1 %
Lymphocytes Absolute: 2.3 10*3/uL (ref 0.7–3.1)
Lymphs: 26 %
MCH: 29.2 pg (ref 26.6–33.0)
MCHC: 33 g/dL (ref 31.5–35.7)
MCV: 88 fL (ref 79–97)
Monocytes Absolute: 0.7 10*3/uL (ref 0.1–0.9)
Monocytes: 8 %
Neutrophils Absolute: 5.3 10*3/uL (ref 1.4–7.0)
Neutrophils: 61 %
Platelets: 218 10*3/uL (ref 150–450)
RBC: 5.04 x10E6/uL (ref 4.14–5.80)
RDW: 12.7 % (ref 11.6–15.4)
WBC: 8.6 10*3/uL (ref 3.4–10.8)

## 2020-06-03 LAB — COMPREHENSIVE METABOLIC PANEL
ALT: 26 IU/L (ref 0–44)
AST: 23 IU/L (ref 0–40)
Albumin/Globulin Ratio: 1.5 (ref 1.2–2.2)
Albumin: 4.1 g/dL (ref 3.7–4.7)
Alkaline Phosphatase: 90 IU/L (ref 44–121)
BUN/Creatinine Ratio: 17 (ref 10–24)
BUN: 18 mg/dL (ref 8–27)
Bilirubin Total: 0.4 mg/dL (ref 0.0–1.2)
CO2: 23 mmol/L (ref 20–29)
Calcium: 9.1 mg/dL (ref 8.6–10.2)
Chloride: 104 mmol/L (ref 96–106)
Creatinine, Ser: 1.04 mg/dL (ref 0.76–1.27)
GFR calc Af Amer: 81 mL/min/{1.73_m2} (ref >59)
GFR calc non Af Amer: 70 mL/min/{1.73_m2} (ref >59)
Globulin, Total: 2.7 g/dL (ref 1.5–4.5)
Glucose: 137 mg/dL — ABNORMAL HIGH (ref 65–99)
Potassium: 4 mmol/L (ref 3.5–5.2)
Sodium: 139 mmol/L (ref 134–144)
Total Protein: 6.8 g/dL (ref 6.0–8.5)

## 2020-06-03 LAB — LIPID PANEL
Chol/HDL Ratio: 2.9 ratio (ref 0.0–5.0)
Cholesterol, Total: 120 mg/dL (ref 100–199)
HDL: 41 mg/dL (ref >39)
LDL Chol Calc (NIH): 67 mg/dL (ref 0–99)
Triglycerides: 54 mg/dL (ref 0–149)
VLDL Cholesterol Cal: 12 mg/dL (ref 5–40)

## 2020-06-03 LAB — SEDIMENTATION RATE: Sed Rate: 18 mm/hr (ref 0–30)

## 2020-06-03 LAB — CK: Total CK: 57 U/L (ref 41–331)

## 2020-06-03 LAB — TSH: TSH: 1.86 u[IU]/mL (ref 0.450–4.500)

## 2020-06-03 LAB — HEMOGLOBIN A1C
Est. average glucose Bld gHb Est-mCnc: 143 mg/dL
Hgb A1c MFr Bld: 6.6 % — ABNORMAL HIGH (ref 4.8–5.6)

## 2020-06-05 ENCOUNTER — Ambulatory Visit: Payer: Medicare PPO | Admitting: Internal Medicine

## 2020-06-05 ENCOUNTER — Other Ambulatory Visit: Payer: Self-pay

## 2020-06-05 ENCOUNTER — Encounter: Payer: Self-pay | Admitting: Internal Medicine

## 2020-06-05 VITALS — BP 138/72 | HR 71 | Temp 98.2°F

## 2020-06-05 DIAGNOSIS — M2550 Pain in unspecified joint: Secondary | ICD-10-CM | POA: Insufficient documentation

## 2020-06-05 DIAGNOSIS — R350 Frequency of micturition: Secondary | ICD-10-CM | POA: Diagnosis not present

## 2020-06-05 DIAGNOSIS — N320 Bladder-neck obstruction: Secondary | ICD-10-CM | POA: Diagnosis not present

## 2020-06-05 DIAGNOSIS — I2583 Coronary atherosclerosis due to lipid rich plaque: Secondary | ICD-10-CM

## 2020-06-05 DIAGNOSIS — I2510 Atherosclerotic heart disease of native coronary artery without angina pectoris: Secondary | ICD-10-CM | POA: Diagnosis not present

## 2020-06-05 LAB — POCT URINALYSIS DIPSTICK
Appearance: NEGATIVE
Bilirubin, UA: NEGATIVE
Blood, UA: NEGATIVE
Glucose, UA: POSITIVE — AB
Ketones, UA: NEGATIVE
Leukocytes, UA: NEGATIVE
Nitrite, UA: NEGATIVE
Protein, UA: NEGATIVE
Spec Grav, UA: 1.02 (ref 1.010–1.025)
Urobilinogen, UA: 0.2 E.U./dL
pH, UA: 5.5 (ref 5.0–8.0)

## 2020-06-05 MED ORDER — PREDNISONE 5 MG PO TABS: 15.0000 mg | ORAL_TABLET | Freq: Every day | ORAL | 1 refills | Status: DC

## 2020-06-05 MED ORDER — FINASTERIDE 5 MG PO TABS: 5.0000 mg | ORAL_TABLET | Freq: Every day | ORAL | 3 refills | Status: DC

## 2020-06-05 NOTE — Assessment & Plan Note (Addendum)
New severe arthralgias and stiffness.  R/o PMR.  PMR versus other etiologies. Stop Lipitor x 4-5 days. If not better - restart Lipitor and start prednisone.

## 2020-06-05 NOTE — Assessment & Plan Note (Addendum)
Worsening symptoms.  Start Finasteride Urol ref

## 2020-06-05 NOTE — Patient Instructions (Signed)
Stop Lipitor x 4-5 days. If not better - restart Lipitor and start prednisone.      Polymyalgia Rheumatica Polymyalgia rheumatica (PMR) is an inflammatory disorder that causes the muscles and joints to ache and become stiff. Sometimes, PMR leads to a more dangerous condition that can cause vision loss (temporal arteritis or giant cell arteritis). What are the causes? The exact cause of PMR is not known. What increases the risk? You are more likely to develop this condition if you are:  Male.  35 years of age or older.  Caucasian. What are the signs or symptoms? Pain and stiffness are the main symptoms of PMR. Symptoms may:  Be worse after inactivity and in the morning.  Affect your: ? Hips, buttocks, and thighs. ? Neck, arms, and shoulders. This can make it hard to raise your arms above your head. ? Hands and wrists. Other symptoms include:  Fever.  Tiredness.  Weakness.  Depression.  Decreased appetite. This may lead to weight loss. Symptoms may start slowly or suddenly. How is this diagnosed? This condition is diagnosed with your medical history and a physical exam. You may need to see a health care provider who specializes in diseases of the joints, muscles, and bones (rheumatologist). You may also have tests, including:  Blood tests.  X-rays.  Ultrasound. How is this treated? PMR usually goes away without treatment, but it may take years. Your health care provider may recommend low-dose steroids and other medicines to help manage your symptoms of pain and stiffness. Regular exercise and rest will also help your symptoms. Follow these instructions at home:   Take over-the-counter and prescription medicines only as told by your health care provider.  Make sure to get enough rest and sleep.  Eat a healthy and nutritious diet.  Try to exercise most days of the week. Ask your health care provider what type of exercise is best for you.  Keep all follow-up  visits as told by your health care provider. This is important. Contact a health care provider if:  Your symptoms do not improve with medicine.  You have side effects from steroids. These may include: ? Weight gain. ? Swelling. ? Insomnia. ? Mood changes. ? Bruising. ? High blood sugar readings, if you have diabetes. ? Higher than normal blood pressure readings, if you monitor your blood pressure. Get help right away if:  You develop symptoms of temporal arteritis, such as: ? A change in vision. ? Severe headache. ? Scalp pain. ? Jaw pain. Summary  Polymyalgia rheumatica is an inflammatory disorder that causes aching and stiffness in your muscles and joints.  The exact cause of this condition is not known.  This condition usually goes away without treatment. Your health care provider may give you low-dose steroids to help manage your pain and stiffness.  Rest and regular exercise will help the symptoms. This information is not intended to replace advice given to you by your health care provider. Make sure you discuss any questions you have with your health care provider. Document Revised: 04/27/2018 Document Reviewed: 04/27/2018 Elsevier Patient Education  2020 [employer].

## 2020-06-05 NOTE — Progress Notes (Signed)
Subjective:  Patient ID: Joshua Brown, male    DOB: 02-09-1946  Age: 74 y.o. MRN: [id number]  CC: Urinary Frequency and Leg Pain   HPI Joshua Brown presents for frequent urination C/o joint pains, muscle pains - worse x months, much worse in the last 30 d. Legs are weak..he is stiff in AM >1-2 hrs F/u CAD  Outpatient Medications Prior to Visit  Medication Sig Dispense Refill  . acetaminophen (TYLENOL) 500 MG tablet Take 500 mg by mouth every 6 (six) hours as needed.    . ALPRAZolam (XANAX) 0.5 MG tablet Take 1 tablet (0.5 mg total) by mouth at bedtime as needed for anxiety. 90 tablet 3  . atorvastatin (LIPITOR) 80 MG tablet Take 1 tablet (80 mg total) by mouth daily. 90 tablet 2  . b complex vitamins capsule Take 1 capsule by mouth daily.      . Cholecalciferol (VITAMIN D) 2000 UNIT CAPS Take 2,000 Units by mouth daily.     . clopidogrel (PLAVIX) 75 MG tablet Take 1 tablet (75 mg total) by mouth daily. 90 tablet 2  . ezetimibe (ZETIA) 10 MG tablet Take 1 tablet (10 mg total) by mouth daily. 90 tablet 2  . Glucosamine-Chondroit-Vit C-Mn (GLUCOSAMINE CHONDR 1500 COMPLX PO) Take by mouth.    . losartan (COZAAR) 25 MG tablet Take 1 tablet (25 mg total) by mouth daily. 90 tablet 2  . metoprolol succinate (TOPROL-XL) 50 MG 24 hr tablet Take 1 tablet by mouth every day with or immediately after a meal 90 tablet 2  . naproxen (NAPROSYN) 500 MG tablet Take 500 mg by mouth 2 (two) times daily as needed.     . niacin (NIASPAN) 500 MG CR tablet Take 2,000 mg by mouth at bedtime.      . nitroGLYCERIN (NITROSTAT) 0.4 MG SL tablet Place 1 tablet (0.4 mg total) under the tongue every 5 (five) minutes as needed for chest pain. 25 tablet 5  . pantoprazole (PROTONIX) 40 MG tablet Take 1 tablet (40 mg total) by mouth daily. 90 tablet 2  . sildenafil (VIAGRA) 100 MG tablet Take 1 tablet (100 mg total) by mouth as needed for erectile dysfunction. 12 tablet 5  . triamcinolone cream (KENALOG) 0.1 % Apply 1  application topically as needed.     No facility-administered medications prior to visit.    ROS: Review of Systems  Constitutional: Negative for appetite change, fatigue and unexpected weight change.  HENT: Negative for congestion, nosebleeds, sneezing, sore throat and trouble swallowing.   Eyes: Negative for itching and visual disturbance.  Respiratory: Negative for cough.   Cardiovascular: Negative for chest pain, palpitations and leg swelling.  Gastrointestinal: Negative for abdominal distention, blood in stool, diarrhea and nausea.  Genitourinary: Negative for frequency and hematuria.  Musculoskeletal: Positive for arthralgias and neck stiffness. Negative for back pain, gait problem, joint swelling and neck pain.  Skin: Negative for rash.  Neurological: Negative for dizziness, tremors, speech difficulty and weakness.  Psychiatric/Behavioral: Negative for agitation, dysphoric mood and sleep disturbance. The patient is not nervous/anxious.     Objective:  BP 138/72 (BP Location: Left Arm)   Pulse 71   Temp 98.2 F (36.8 C) (Oral)   SpO2 98%   BP Readings from Last 3 Encounters:  06/05/20 138/72  05/28/20 110/60  05/28/19 120/80    Wt Readings from Last 3 Encounters:  05/28/20 167 lb (75.8 kg)  05/28/19 171 lb (77.6 kg)  07/25/18 168 lb (76.2 kg)  Physical Exam Constitutional:      General: He is not in acute distress.    Appearance: He is well-developed.     Comments: NAD  HENT:     Mouth/Throat:     Mouth: Oropharynx is clear and moist.  Eyes:     Conjunctiva/sclera: Conjunctivae normal.     Pupils: Pupils are equal, round, and reactive to light.  Neck:     Thyroid: No thyromegaly.     Vascular: No JVD.  Cardiovascular:     Rate and Rhythm: Normal rate and regular rhythm.     Pulses: Intact distal pulses.     Heart sounds: Normal heart sounds. No murmur heard. No friction rub. No gallop.   Pulmonary:     Effort: Pulmonary effort is normal. No  respiratory distress.     Breath sounds: Normal breath sounds. No wheezing or rales.  Chest:     Chest wall: No tenderness.  Abdominal:     General: Bowel sounds are normal. There is no distension.     Palpations: Abdomen is soft. There is no mass.     Tenderness: There is no abdominal tenderness. There is no guarding or rebound.  Musculoskeletal:        General: Tenderness present. No edema. Normal range of motion.     Cervical back: Normal range of motion.  Lymphadenopathy:     Cervical: No cervical adenopathy.  Skin:    General: Skin is warm and dry.     Findings: No rash.  Neurological:     Mental Status: He is alert and oriented to person, place, and time.     Cranial Nerves: No cranial nerve deficit.     Motor: No abnormal muscle tone.     Coordination: He displays a negative Romberg sign. Coordination normal.     Gait: Gait normal.     Deep Tendon Reflexes: Reflexes are normal and symmetric.  Psychiatric:        Mood and Affect: Mood and affect normal.        Behavior: Behavior normal.        Thought Content: Thought content normal.        Judgment: Judgment normal.   Hips and shoulders with fairly good range of motion.  LS spine, cervical spine tender to palpation over paraspinal muscles and with range of motion   A total time of >45 minutes was spent preparing to see the patient, reviewing tests, x-rays, operative reports and outside records.  Also, obtaining history and performing comprehensive physical exam.  Additionally, counseling the patient regarding the above listed issues.   Finally, documenting clinical information in the health records, coordination of care, educating the patient re - possible PMR vs other. It is a complex case.   Lab Results  Component Value Date   WBC 8.6 06/02/2020   HGB 14.7 06/02/2020   HCT 44.5 06/02/2020   PLT 218 06/02/2020   GLUCOSE 137 (H) 06/02/2020   CHOL 120 06/02/2020   TRIG 54 06/02/2020   HDL 41 06/02/2020   LDLCALC 67  06/02/2020   ALT 26 06/02/2020   AST 23 06/02/2020   NA 139 06/02/2020   K 4.0 06/02/2020   CL 104 06/02/2020   CREATININE 1.04 06/02/2020   BUN 18 06/02/2020   CO2 23 06/02/2020   TSH 1.860 06/02/2020   PSA 0.57 08/02/2018   INR 1.1 RATIO (H) 05/16/2008   HGBA1C 6.6 (H) 06/02/2020    VAS [place] ABI WITH/WO TBI  Result Date: 05/22/2020 LOWER EXTREMITY DOPPLER STUDY Indications: Peripheral artery disease. High Risk Factors: Hypertension, past history of smoking, prior MI, coronary                    artery disease. Other Factors: Patient denies claudication or rest pain.  Comparison Study: 05/21/19 Performing Technologist: June Leap RVT  Examination Guidelines: A complete evaluation includes at minimum, Doppler waveform signals and systolic blood pressure reading at the level of bilateral brachial, anterior tibial, and posterior tibial arteries, when vessel segments are accessible. Bilateral testing is considered an integral part of a complete examination. Photoelectric Plethysmograph (PPG) waveforms and toe systolic pressure readings are included as required and additional duplex testing as needed. Limited examinations for reoccurring indications may be performed as noted.  ABI Findings: +---------+------------------+-----+---------+--------+ Right    Rt Pressure (mmHg)IndexWaveform Comment  +---------+------------------+-----+---------+--------+ Brachial 152                                      +---------+------------------+-----+---------+--------+ ATA      135               0.89 triphasic         +---------+------------------+-----+---------+--------+ PTA      144               0.95 triphasic         +---------+------------------+-----+---------+--------+ PERO     114               0.75 triphasic         +---------+------------------+-----+---------+--------+ Great Toe89                0.59 Abnormal           +---------+------------------+-----+---------+--------+ +---------+------------------+-----+---------+-------+ Left     Lt Pressure (mmHg)IndexWaveform Comment +---------+------------------+-----+---------+-------+ Brachial 142                                     +---------+------------------+-----+---------+-------+ ATA      101               0.66 triphasic        +---------+------------------+-----+---------+-------+ PTA      129               0.85 triphasic        +---------+------------------+-----+---------+-------+ PERO     122               0.80 biphasic         +---------+------------------+-----+---------+-------+ [place]                0.55 Abnormal         +---------+------------------+-----+---------+-------+ +-------+-----------+-----------+------------+------------+ ABI/TBIToday's ABIToday's TBIPrevious ABIPrevious TBI +-------+-----------+-----------+------------+------------+ Right  0.95       0.59       0.99        0.47         +-------+-----------+-----------+------------+------------+ Left   0.85       0.55       0.99        0.62         +-------+-----------+-----------+------------+------------+  Left ABIs appear decreased compared to prior study on 05/21/19.  Summary: Right: Resting right ankle-brachial index is within normal range. No evidence of significant right lower extremity arterial disease. The right toe-brachial index is abnormal. Left: Resting left  ankle-brachial index indicates mild left lower extremity arterial disease. The left toe-brachial index is abnormal.  *See table(s) above for measurements and observations.  Suggest follow up study in 12 months. Electronically signed by Larae Grooms MD on 05/22/2020 at 7:57:54 PM.    Final    VAS US AORTA/IVC/ILIACS  Result Date: 05/22/2020 ABDOMINAL AORTA STUDY Risk Factors: Hypertension, past history of smoking, prior MI, coronary artery               disease. Other  Factors: Patient denies any claudication or rest pain. Limitations: Air/bowel gas.  Comparison Study: 05/21/19 Aortoiliac duplex showed >50% stenosis in the Aorta                   and Bilateral common iliac arteries. Performing Technologist: June Leap RDMS, RVT  Examination Guidelines: A complete evaluation includes B-mode imaging, spectral Doppler, color Doppler, and power Doppler as needed of all accessible portions of each vessel. Bilateral testing is considered an integral part of a complete examination. Limited examinations for reoccurring indications may be performed as noted.  Abdominal Aorta Findings: +-------------+-------+----------+----------+---------+--------+--------+ Location     AP (cm)Trans (cm)PSV (cm/s)Waveform ThrombusComments +-------------+-------+----------+----------+---------+--------+--------+ Proximal                      194                                 +-------------+-------+----------+----------+---------+--------+--------+ Mid          1.52   1.65      166                                 +-------------+-------+----------+----------+---------+--------+--------+ Distal                        309                                 +-------------+-------+----------+----------+---------+--------+--------+ RT CIA Prox                   294       triphasic                 +-------------+-------+----------+----------+---------+--------+--------+ RT CIA Mid                    156       triphasic                 +-------------+-------+----------+----------+---------+--------+--------+ RT CIA Distal                 131       triphasic                 +-------------+-------+----------+----------+---------+--------+--------+ RT EIA Prox                   114       biphasic                  +-------------+-------+----------+----------+---------+--------+--------+ RT EIA Mid                    98        biphasic                   +-------------+-------+----------+----------+---------+--------+--------+  RT EIA Distal                 73        biphasic                  +-------------+-------+----------+----------+---------+--------+--------+ LT CIA Prox                   171       biphasic                  +-------------+-------+----------+----------+---------+--------+--------+ LT CIA Mid                    84        biphasic                  +-------------+-------+----------+----------+---------+--------+--------+ LT CIA Distal                 115       biphasic                  +-------------+-------+----------+----------+---------+--------+--------+ LT EIA Prox                   105       biphasic                  +-------------+-------+----------+----------+---------+--------+--------+ LT EIA Mid                    93        biphasic                  +-------------+-------+----------+----------+---------+--------+--------+ LT EIA Distal                 96        biphasic                  +-------------+-------+----------+----------+---------+--------+--------+ Visualization of the Proximal Abdominal Aorta, Mid Abdominal Aorta and Distal Abdominal Aorta was limited.  Summary: Abdominal Aorta: The largest aortic measurement is 1.6 cm. Stenosis: +--------------------+-------------+ Location            Stenosis      +--------------------+-------------+ Distal Aorta        >50% stenosis +--------------------+-------------+ Right Common Iliac  >50% stenosis +--------------------+-------------+ Left Common Iliac   <50% stenosis +--------------------+-------------+ Right External Iliac<50% stenosis +--------------------+-------------+ Left External Iliac <50% stenosis +--------------------+-------------+ Atherosclerosis in the aorta and iliac arteries. Elevated velocities in the aorta and iliac arteries. Essentially stable compared to prior exam. IVC/Iliac: There is no  evidence of thrombus involving the IVC.  *See table(s) above for measurements and observations. Suggest follow up study in 12 months.  Electronically signed by Larae Grooms MD on 05/22/2020 at 8:09:50 PM.    Final     Assessment & Plan:   Jermone was seen today for urinary frequency and leg pain.  Diagnoses and all orders for this visit:  Urine frequency -     POCT Urinalysis Dipstick     No orders of the defined types were placed in this encounter.    Follow-up: No follow-ups on file.  Joshua Kehr, MD

## 2020-06-12 ENCOUNTER — Other Ambulatory Visit: Payer: Self-pay | Admitting: Cardiovascular Disease

## 2020-06-16 ENCOUNTER — Encounter: Payer: Self-pay | Admitting: Internal Medicine

## 2020-06-16 NOTE — Assessment & Plan Note (Signed)
No angina.  He will continue with Lipitor, Plavix, losartan, Toprol, Zetia, niacin He never had the need to take nitroglycerin

## 2020-06-19 ENCOUNTER — Encounter: Payer: Self-pay | Admitting: Internal Medicine

## 2020-06-19 ENCOUNTER — Other Ambulatory Visit: Payer: Self-pay

## 2020-06-19 ENCOUNTER — Ambulatory Visit: Payer: Medicare PPO | Admitting: Internal Medicine

## 2020-06-19 DIAGNOSIS — E785 Hyperlipidemia, unspecified: Secondary | ICD-10-CM

## 2020-06-19 DIAGNOSIS — I10 Essential (primary) hypertension: Secondary | ICD-10-CM | POA: Diagnosis not present

## 2020-06-19 DIAGNOSIS — M353 Polymyalgia rheumatica: Secondary | ICD-10-CM | POA: Diagnosis not present

## 2020-06-19 DIAGNOSIS — I2510 Atherosclerotic heart disease of native coronary artery without angina pectoris: Secondary | ICD-10-CM | POA: Diagnosis not present

## 2020-06-19 DIAGNOSIS — I2583 Coronary atherosclerosis due to lipid rich plaque: Secondary | ICD-10-CM

## 2020-06-19 DIAGNOSIS — N320 Bladder-neck obstruction: Secondary | ICD-10-CM

## 2020-06-19 NOTE — Assessment & Plan Note (Signed)
Zetia, Lipitor, Niacin

## 2020-06-19 NOTE — Progress Notes (Signed)
Subjective:  Patient ID: Joshua Brown, male    DOB: 22-Sep-1945  Age: 74 y.o. MRN: [id number]  CC: Follow-up (2 week f/u)   HPI SAAHAS HIDROGO presents for PMR  Outpatient Medications Prior to Visit  Medication Sig Dispense Refill  . acetaminophen (TYLENOL) 500 MG tablet Take 500 mg by mouth every 6 (six) hours as needed.    . ALPRAZolam (XANAX) 0.5 MG tablet Take 1 tablet (0.5 mg total) by mouth at bedtime as needed for anxiety. 90 tablet 3  . atorvastatin (LIPITOR) 80 MG tablet Take 1 tablet (80 mg total) by mouth daily. 90 tablet 2  . b complex vitamins capsule Take 1 capsule by mouth daily.      . Cholecalciferol (VITAMIN D) 2000 UNIT CAPS Take 2,000 Units by mouth daily.     . clopidogrel (PLAVIX) 75 MG tablet TAKE 1 TABLET EVERY DAY 90 tablet 2  . ezetimibe (ZETIA) 10 MG tablet Take 1 tablet (10 mg total) by mouth daily. 90 tablet 2  . finasteride (PROSCAR) 5 MG tablet Take 1 tablet (5 mg total) by mouth daily. 90 tablet 3  . losartan (COZAAR) 25 MG tablet TAKE 1 TABLET (25 MG TOTAL) BY MOUTH DAILY. 90 tablet 2  . metoprolol succinate (TOPROL-XL) 50 MG 24 hr tablet TAKE 1 TABLET BY MOUTH EVERY DAY WITH OR IMMEDIATELY AFTER A MEAL 90 tablet 2  . naproxen (NAPROSYN) 500 MG tablet Take 500 mg by mouth 2 (two) times daily as needed.     . niacin (NIASPAN) 500 MG CR tablet Take 2,000 mg by mouth at bedtime.    . nitroGLYCERIN (NITROSTAT) 0.4 MG SL tablet Place 1 tablet (0.4 mg total) under the tongue every 5 (five) minutes as needed for chest pain. 25 tablet 5  . pantoprazole (PROTONIX) 40 MG tablet Take 1 tablet (40 mg total) by mouth daily. 90 tablet 2  . predniSONE (DELTASONE) 5 MG tablet Take 3 tablets (15 mg total) by mouth daily with breakfast. 90 tablet 1  . sildenafil (VIAGRA) 100 MG tablet Take 1 tablet (100 mg total) by mouth as needed for erectile dysfunction. 12 tablet 5  . triamcinolone cream (KENALOG) 0.1 % Apply 1 application topically as needed.    .  Glucosamine-Chondroit-Vit C-Mn (GLUCOSAMINE CHONDR 1500 COMPLX PO) Take by mouth. (Patient not taking: Reported on 06/19/2020)     No facility-administered medications prior to visit.    ROS: Review of Systems  Constitutional: Negative for appetite change, fatigue and unexpected weight change.  HENT: Negative for congestion, nosebleeds, sneezing, sore throat and trouble swallowing.   Eyes: Negative for itching and visual disturbance.  Respiratory: Negative for cough.   Cardiovascular: Negative for chest pain, palpitations and leg swelling.  Gastrointestinal: Negative for abdominal distention, blood in stool, diarrhea and nausea.  Genitourinary: Negative for frequency and hematuria.  Musculoskeletal: Negative for arthralgias, back pain, gait problem, joint swelling and neck pain.  Skin: Negative for rash.  Neurological: Negative for dizziness, tremors, speech difficulty and weakness.  Psychiatric/Behavioral: Negative for agitation, dysphoric mood, sleep disturbance and suicidal ideas. The patient is not nervous/anxious.     Objective:  BP 134/80 (BP Location: Left Arm)   Pulse 74   Temp 98.9 F (37.2 C) (Oral)   Wt 167 lb (75.8 kg)   SpO2 97%   BMI 26.16 kg/m   BP Readings from Last 3 Encounters:  06/19/20 134/80  06/05/20 138/72  05/28/20 110/60    Wt Readings from Last 3 Encounters:  06/19/20 167 lb (75.8 kg)  05/28/20 167 lb (75.8 kg)  05/28/19 171 lb (77.6 kg)    Physical Exam Constitutional:      General: He is not in acute distress.    Appearance: He is well-developed.     Comments: NAD  HENT:     Mouth/Throat:     Mouth: Oropharynx is clear and moist.  Eyes:     Conjunctiva/sclera: Conjunctivae normal.     Pupils: Pupils are equal, round, and reactive to light.  Neck:     Thyroid: No thyromegaly.     Vascular: No JVD.  Cardiovascular:     Rate and Rhythm: Normal rate and regular rhythm.     Pulses: Intact distal pulses.     Heart sounds: Normal heart  sounds. No murmur heard. No friction rub. No gallop.   Pulmonary:     Effort: Pulmonary effort is normal. No respiratory distress.     Breath sounds: Normal breath sounds. No wheezing or rales.  Chest:     Chest wall: No tenderness.  Abdominal:     General: Bowel sounds are normal. There is no distension.     Palpations: Abdomen is soft. There is no mass.     Tenderness: There is no abdominal tenderness. There is no guarding or rebound.  Musculoskeletal:        General: No tenderness or edema. Normal range of motion.     Cervical back: Normal range of motion.  Lymphadenopathy:     Cervical: No cervical adenopathy.  Skin:    General: Skin is warm and dry.     Findings: No rash.  Neurological:     Mental Status: He is alert and oriented to person, place, and time.     Cranial Nerves: No cranial nerve deficit.     Motor: No abnormal muscle tone.     Coordination: He displays a negative Romberg sign. Coordination normal.     Gait: Gait normal.     Deep Tendon Reflexes: Reflexes are normal and symmetric.  Psychiatric:        Mood and Affect: Mood and affect normal.        Behavior: Behavior normal.        Thought Content: Thought content normal.        Judgment: Judgment normal.    FTF >30 min discussing PMR etiology, progression, prognosis and treatment.  Steroid side effects were discussed as well. Lab Results  Component Value Date   WBC 8.6 06/02/2020   HGB 14.7 06/02/2020   HCT 44.5 06/02/2020   PLT 218 06/02/2020   GLUCOSE 137 (H) 06/02/2020   CHOL 120 06/02/2020   TRIG 54 06/02/2020   HDL 41 06/02/2020   LDLCALC 67 06/02/2020   ALT 26 06/02/2020   AST 23 06/02/2020   NA 139 06/02/2020   K 4.0 06/02/2020   CL 104 06/02/2020   CREATININE 1.04 06/02/2020   BUN 18 06/02/2020   CO2 23 06/02/2020   TSH 1.860 06/02/2020   PSA 0.57 08/02/2018   INR 1.1 RATIO (H) 05/16/2008   HGBA1C 6.6 (H) 06/02/2020    VAS [place] ABI WITH/WO TBI  Result Date: 05/22/2020 LOWER  EXTREMITY DOPPLER STUDY Indications: Peripheral artery disease. High Risk Factors: Hypertension, past history of smoking, prior MI, coronary                    artery disease. Other Factors: Patient denies claudication or rest pain.  Comparison Study: 05/21/19 Performing Technologist:  June Leap RVT  Examination Guidelines: A complete evaluation includes at minimum, Doppler waveform signals and systolic blood pressure reading at the level of bilateral brachial, anterior tibial, and posterior tibial arteries, when vessel segments are accessible. Bilateral testing is considered an integral part of a complete examination. Photoelectric Plethysmograph (PPG) waveforms and toe systolic pressure readings are included as required and additional duplex testing as needed. Limited examinations for reoccurring indications may be performed as noted.  ABI Findings: +---------+------------------+-----+---------+--------+ Right    Rt Pressure (mmHg)IndexWaveform Comment  +---------+------------------+-----+---------+--------+ Brachial 152                                      +---------+------------------+-----+---------+--------+ ATA      135               0.89 triphasic         +---------+------------------+-----+---------+--------+ PTA      144               0.95 triphasic         +---------+------------------+-----+---------+--------+ PERO     114               0.75 triphasic         +---------+------------------+-----+---------+--------+ Great Toe89                0.59 Abnormal          +---------+------------------+-----+---------+--------+ +---------+------------------+-----+---------+-------+ Left     Lt Pressure (mmHg)IndexWaveform Comment +---------+------------------+-----+---------+-------+ Brachial 142                                     +---------+------------------+-----+---------+-------+ ATA      101               0.66 triphasic         +---------+------------------+-----+---------+-------+ PTA      129               0.85 triphasic        +---------+------------------+-----+---------+-------+ PERO     122               0.80 biphasic         +---------+------------------+-----+---------+-------+ Esau Grew                0.55 Abnormal         +---------+------------------+-----+---------+-------+ +-------+-----------+-----------+------------+------------+ ABI/TBIToday's ABIToday's TBIPrevious ABIPrevious TBI +-------+-----------+-----------+------------+------------+ Right  0.95       0.59       0.99        0.47         +-------+-----------+-----------+------------+------------+ Left   0.85       0.55       0.99        0.62         +-------+-----------+-----------+------------+------------+  Left ABIs appear decreased compared to prior study on 05/21/19.  Summary: Right: Resting right ankle-brachial index is within normal range. No evidence of significant right lower extremity arterial disease. The right toe-brachial index is abnormal. Left: Resting left ankle-brachial index indicates mild left lower extremity arterial disease. The left toe-brachial index is abnormal.  *See table(s) above for measurements and observations.  Suggest follow up study in 12 months. Electronically signed by Larae Grooms MD on 05/22/2020 at 7:57:54 PM.    Final    VAS US AORTA/IVC/ILIACS  Result Date: 05/22/2020 ABDOMINAL  AORTA STUDY Risk Factors: Hypertension, past history of smoking, prior MI, coronary artery               disease. Other Factors: Patient denies any claudication or rest pain. Limitations: Air/bowel gas.  Comparison Study: 05/21/19 Aortoiliac duplex showed >50% stenosis in the Aorta                   and Bilateral common iliac arteries. Performing Technologist: June Leap RDMS, RVT  Examination Guidelines: A complete evaluation includes B-mode imaging, spectral Doppler, color Doppler, and power Doppler as  needed of all accessible portions of each vessel. Bilateral testing is considered an integral part of a complete examination. Limited examinations for reoccurring indications may be performed as noted.  Abdominal Aorta Findings: +-------------+-------+----------+----------+---------+--------+--------+ Location     AP (cm)Trans (cm)PSV (cm/s)Waveform ThrombusComments +-------------+-------+----------+----------+---------+--------+--------+ Proximal                      194                                 +-------------+-------+----------+----------+---------+--------+--------+ Mid          1.52   1.65      166                                 +-------------+-------+----------+----------+---------+--------+--------+ Distal                        309                                 +-------------+-------+----------+----------+---------+--------+--------+ RT CIA Prox                   294       triphasic                 +-------------+-------+----------+----------+---------+--------+--------+ RT CIA Mid                    156       triphasic                 +-------------+-------+----------+----------+---------+--------+--------+ RT CIA Distal                 131       triphasic                 +-------------+-------+----------+----------+---------+--------+--------+ RT EIA Prox                   114       biphasic                  +-------------+-------+----------+----------+---------+--------+--------+ RT EIA Mid                    98        biphasic                  +-------------+-------+----------+----------+---------+--------+--------+ RT EIA Distal                 73        biphasic                  +-------------+-------+----------+----------+---------+--------+--------+ LT CIA Prox  171       biphasic                  +-------------+-------+----------+----------+---------+--------+--------+ LT CIA Mid                     84        biphasic                  +-------------+-------+----------+----------+---------+--------+--------+ LT CIA Distal                 115       biphasic                  +-------------+-------+----------+----------+---------+--------+--------+ LT EIA Prox                   105       biphasic                  +-------------+-------+----------+----------+---------+--------+--------+ LT EIA Mid                    93        biphasic                  +-------------+-------+----------+----------+---------+--------+--------+ LT EIA Distal                 96        biphasic                  +-------------+-------+----------+----------+---------+--------+--------+ Visualization of the Proximal Abdominal Aorta, Mid Abdominal Aorta and Distal Abdominal Aorta was limited.  Summary: Abdominal Aorta: The largest aortic measurement is 1.6 cm. Stenosis: +--------------------+-------------+ Location            Stenosis      +--------------------+-------------+ Distal Aorta        >50% stenosis +--------------------+-------------+ Right Common Iliac  >50% stenosis +--------------------+-------------+ Left Common Iliac   <50% stenosis +--------------------+-------------+ Right External Iliac<50% stenosis +--------------------+-------------+ Left External Iliac <50% stenosis +--------------------+-------------+ Atherosclerosis in the aorta and iliac arteries. Elevated velocities in the aorta and iliac arteries. Essentially stable compared to prior exam. IVC/Iliac: There is no evidence of thrombus involving the IVC.  *See table(s) above for measurements and observations. Suggest follow up study in 12 months.  Electronically signed by Larae Grooms MD on 05/22/2020 at 8:09:50 PM.    Final     Assessment & Plan:    Walker Kehr, MD

## 2020-06-19 NOTE — Assessment & Plan Note (Addendum)
No CP Lipitor, Plavix, Losartan, Toprol, Zetia Niacin Joshua Brown asked me a question about Viagra and Cialis type of medicines.  These meds are contraindicated with as needed nitroglycerin.  He is aware

## 2020-06-19 NOTE — Assessment & Plan Note (Addendum)
On Prednisone 15 mg/d-symptoms have improved Obtain rheumatology consultation with Dr. Amil Amen RTC 6 wks

## 2020-06-22 ENCOUNTER — Encounter: Payer: Self-pay | Admitting: Internal Medicine

## 2020-06-22 NOTE — Assessment & Plan Note (Signed)
Continue with finasteride and Flomax

## 2020-06-22 NOTE — Assessment & Plan Note (Signed)
No change in blood pressure on steroids.  Continue with losartan and Toprol

## 2020-07-03 ENCOUNTER — Ambulatory Visit: Payer: Medicare PPO

## 2020-07-04 ENCOUNTER — Other Ambulatory Visit: Payer: Self-pay | Admitting: Internal Medicine

## 2020-07-04 MED ORDER — PREDNISONE 5 MG PO TABS: 15.0000 mg | ORAL_TABLET | Freq: Every day | ORAL | 1 refills | Status: DC

## 2020-07-08 ENCOUNTER — Ambulatory Visit: Payer: Medicare PPO | Admitting: Internal Medicine

## 2020-07-08 ENCOUNTER — Ambulatory Visit (INDEPENDENT_AMBULATORY_CARE_PROVIDER_SITE_OTHER): Payer: Medicare PPO

## 2020-07-08 DIAGNOSIS — Z0000 Encounter for general adult medical examination without abnormal findings: Secondary | ICD-10-CM | POA: Diagnosis not present

## 2020-07-08 NOTE — Progress Notes (Addendum)
I connected with Joshua Brown today by telephone and verified that I am speaking with the correct person using two identifiers. Location patient: home Location provider: work Persons participating in the virtual visit: Joshua Brown and Joshua Abu, LPN.   I discussed the limitations, risks, security and privacy concerns of performing an evaluation and management service by telephone and the availability of in person appointments. I also discussed with the patient that there may be a patient responsible charge related to this service. The patient expressed understanding and verbally consented to this telephonic visit.    Interactive audio and video telecommunications were attempted between this provider and patient, however failed, due to patient having technical difficulties OR patient did not have access to video capability.  We continued and completed visit with audio only.  Some vital signs may be absent or patient reported.   Time Spent with patient on telephone encounter: 30 minutes  Subjective:   Joshua Brown is a 75 y.o. male who presents for Medicare Annual/Subsequent preventive examination.  Review of Systems    No ROS. Medicare Wellness Visit. Additional risk factors are reflected in social history. Cardiac Risk Factors include: advanced age (>62men, >29 women);dyslipidemia;family history of premature cardiovascular disease;hypertension;male gender     Objective:    There were no vitals filed for this visit. There is no height or weight on file to calculate BMI.  Advanced Directives 07/08/2020 06/22/2019  Does Patient Have a Medical Advance Directive? Yes Yes  Type of Advance Directive Living will;Healthcare Power of [place];Living will  Does patient want to make changes to medical advance directive? No - Patient declined -  Copy of [place] in Chart? No - copy requested No - copy requested    Current Medications  (verified) Outpatient Encounter Medications as of 07/08/2020  Medication Sig   acetaminophen (TYLENOL) 500 MG tablet Take 500 mg by mouth every 6 (six) hours as needed.   ALPRAZolam (XANAX) 0.5 MG tablet Take 1 tablet (0.5 mg total) by mouth at bedtime as needed for anxiety.   atorvastatin (LIPITOR) 80 MG tablet Take 1 tablet (80 mg total) by mouth daily.   b complex vitamins capsule Take 1 capsule by mouth daily.     Cholecalciferol (VITAMIN D) 2000 UNIT CAPS Take 2,000 Units by mouth daily.    clopidogrel (PLAVIX) 75 MG tablet TAKE 1 TABLET EVERY DAY   ezetimibe (ZETIA) 10 MG tablet Take 1 tablet (10 mg total) by mouth daily.   finasteride (PROSCAR) 5 MG tablet Take 1 tablet (5 mg total) by mouth daily.   losartan (COZAAR) 25 MG tablet TAKE 1 TABLET (25 MG TOTAL) BY MOUTH DAILY.   metoprolol succinate (TOPROL-XL) 50 MG 24 hr tablet TAKE 1 TABLET BY MOUTH EVERY DAY WITH OR IMMEDIATELY AFTER A MEAL   naproxen (NAPROSYN) 500 MG tablet Take 500 mg by mouth 2 (two) times daily as needed.    niacin (NIASPAN) 500 MG CR tablet Take 2,000 mg by mouth at bedtime.   nitroGLYCERIN (NITROSTAT) 0.4 MG SL tablet Place 1 tablet (0.4 mg total) under the tongue every 5 (five) minutes as needed for chest pain.   pantoprazole (PROTONIX) 40 MG tablet Take 1 tablet (40 mg total) by mouth daily.   predniSONE (DELTASONE) 5 MG tablet Take 3 tablets (15 mg total) by mouth daily with breakfast.   triamcinolone cream (KENALOG) 0.1 % Apply 1 application topically as needed.   No facility-administered encounter medications on file  as of 07/08/2020.    Allergies (verified) Patient has no known allergies.   History: Past Medical History:  Diagnosis Date   ABDOMINAL AORTIC ANEURYSM 11/12/2008   has annual U/s   Atypical mole 03/08/2018   mild atypia on R lower abdomen - no treatment   CAD (coronary artery disease)    premature   DDD (degenerative disc disease)    ED (erectile dysfunction)    Frequent urination at  night    nocturia 2-3   GERD (gastroesophageal reflux disease)    Hyperglycemia    Borderline   Hyperlipidemia    Atherogenic   Hypertension    Myocardial infarct, old    Obesity    Class I   PVD (peripheral vascular disease) (HCC)    ABI June '12 -0.9 bilateral.   Past Surgical History:  Procedure Laterality Date   colonoscopy     one month ago   CORONARY ARTERY BYPASS GRAFT     TONSILLECTOMY     age 74   Family History  Problem Relation Age of Onset   Cancer Mother        Jaw Cancer   Hyperlipidemia Mother    Congestive Heart Failure Mother    Early death Father    Heart disease Father    CAD Father    Heart attack Father    Heart disease Paternal Grandfather    CAD Paternal Grandfather    Heart attack Paternal Grandfather    Prostate cancer Brother    Social History   Socioeconomic History   Marital status: Married    Spouse name: Not on file   Number of children: 3   Years of education: 17   Highest education level: Not on file  Occupational History   Occupation: REAL Environmental manager: SELF EMPLOYED    Comment: 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  Tobacco Use   Smoking status: Former Smoker    Packs/day: 1.00    Years: 16.00    Pack years: 16.00    Types: Cigarettes    Quit date: 07/05/1969    Years since quitting: 51.0   Smokeless tobacco: Never Used  Vaping Use   Vaping Use: Never used  Substance and Sexual Activity   Alcohol use: Yes    Alcohol/week: 2.0 standard drinks    Types: 2 Standard drinks or equivalent per week    Comment: socially   Drug use: No   Sexual activity: Yes    Partners: Female  Other Topics Concern   Not on file  Social History Narrative   HSG, [employer] 1 year. Married 1977. 1 son - 18; 2 daughters - 83, 80; 3  Grandchildren. Work - self - employed Fish farm manager. Marriage in good health. ACP - discussed. Provided packet May '13.   Social Determinants of Health   Financial Resource Strain: Low Risk     Difficulty of Paying Living Expenses: Not hard at all  Food Insecurity: No Food Insecurity   Worried About Programme researcher, broadcasting/film/video in the Last Year: Never true   Ran Out of Food in the Last Year: Never true  Transportation Needs: No Transportation Needs   Lack of Transportation (Medical): No   Lack of Transportation (Non-Medical): No  Physical Activity: Sufficiently Active   Days of Exercise per Week: 5 days   Minutes of Exercise per Session: 30 min  Stress: No Stress Concern Present   Feeling of Stress : Not at all  Social Connections:  Socially Integrated   Frequency of Communication with Friends and Family: More than three times a week   Frequency of Social Gatherings with Friends and Family: Once a week   Attends Religious Services: More than 4 times per year   Active Member of [employer] or Organizations: Yes   Attends Music therapist: More than 4 times per year   Marital Status: Married    Tobacco Counseling Counseling given: Not Answered   Clinical Intake:  Pre-visit preparation completed: Yes  Pain : No/denies pain     Nutritional Risks: None Diabetes: No  How often do you need to have someone help you when you read instructions, pamphlets, or other written materials from your doctor or pharmacy?: 1 - Never What is the last grade level you completed in school?: 5 years of college  Diabetic? no  Interpreter Needed?: No  Information entered by :: Joshua Abu, LPN   Activities of Daily Living In your present state of health, do you have any difficulty performing the following activities: 07/08/2020  Hearing? Y  Comment has some hearing loss  Vision? N  Difficulty concentrating or making decisions? N  Walking or climbing stairs? N  Dressing or bathing? N  Doing errands, shopping? N  Preparing Food and eating ? N  Using the Toilet? N  In the past six months, have you accidently leaked urine? N  Do you have problems with loss of bowel control? N   Managing your Medications? N  Managing your Finances? N  Housekeeping or managing your Housekeeping? N  Some recent data might be hidden    Patient Care Team: Plotnikov, Evie Lacks, MD as PCP - General (Internal Medicine) Sherren Mocha, MD as PCP - Cardiology (Cardiology) Richmond Campbell, MD as Consulting Physician (Gastroenterology) Sharyne Peach, MD as Consulting Physician (Ophthalmology) Carolan Clines, MD (Inactive) as Consulting Physician (Urology) Rigoberto Noel, MD as Consulting Physician (Pulmonary Disease) Lavonna Monarch, MD as Consulting Physician (Dermatology)  Indicate any recent Medical Services you may have received from other than [facility] providers in the past year (date may be approximate).     Assessment:   This is a routine wellness examination for [place].  Hearing/Vision screen No exam data present  Dietary issues and exercise activities discussed: Current Exercise Habits: Structured exercise class, Type of exercise: strength training/weights;stretching;treadmill;walking, Time (Minutes): 30, Frequency (Times/Week): 5, Weekly Exercise (Minutes/Week): 150, Intensity: Moderate, Exercise limited by: cardiac condition(s)  Goals       Patient Stated      I want to increase my physical activity.       Patient Stated (pt-stated)      To stay healthy.       Depression Screen PHQ 2/9 Scores 07/08/2020 06/22/2019 01/18/2018 12/10/2016 12/08/2015 11/27/2014  PHQ - 2 Score 0 0 0 0 0 0    Fall Risk Fall Risk  07/08/2020 06/22/2019 05/25/2019 01/18/2018 12/10/2016  Falls in the past year? 0 0 0 No No  Comment - - Emmi Telephone Survey: data to providers prior to load - -  Number falls in past yr: 0 0 - - -  Injury with Fall? 0 0 - - -  Risk for fall due to : No Fall Risks - - - -  Follow up Falls evaluation completed - - - -    FALL RISK PREVENTION PERTAINING TO THE HOME:  Any stairs in or around the home? No  If so, are there any without handrails? No  Home free  of loose  throw rugs in walkways, pet beds, electrical cords, etc? Yes  Adequate lighting in your home to reduce risk of falls? Yes   ASSISTIVE DEVICES UTILIZED TO PREVENT FALLS:  Life alert? No  Use of a cane, walker or w/c? No  Grab bars in the bathroom? No  Shower chair or bench in shower? No  Elevated toilet seat or a handicapped toilet? No   TIMED UP AND GO:  Was the test performed? No .  Length of time to ambulate 10 feet: 0 sec.   Gait steady and fast without use of assistive device  Cognitive Function:        Immunizations Immunization History  Administered Date(s) Administered   Fluad Quad(high Dose 65+) 04/09/2019   Hepatitis A 05/05/2006, 11/23/2006   Influenza, High Dose Seasonal PF 04/18/2016, 04/25/2017, 04/10/2018, 05/13/2020   PFIZER SARS-COV-2 Vaccination 02/28/2020   Pneumococcal Conjugate-13 11/22/2013   Pneumococcal Polysaccharide-23 11/12/2011   Td 05/05/2006, 12/17/2013   Tdap 05/05/2006, 10/04/2019   Zoster 01/02/2014    TDAP status: Up to date  Flu Vaccine status: Up to date  Pneumococcal vaccine status: Up to date  Covid-19 vaccine status: Completed vaccines  Qualifies for Shingles Vaccine? Yes   Zostavax completed Yes   Shingrix Completed?: No.    Education has been provided regarding the importance of this vaccine. Patient has been advised to call insurance company to determine out of pocket expense if they have not yet received this vaccine. Advised may also receive vaccine at local pharmacy or Health Dept. Verbalized acceptance and understanding.  Screening Tests Health Maintenance  Topic Date Due   COVID-19 Vaccine (2 - Pfizer risk 4-dose series) 03/20/2020   COLONOSCOPY (Pts 45-20yrs Insurance coverage will need to be confirmed)  02/22/2023   TETANUS/TDAP  10/03/2029   INFLUENZA VACCINE  Completed   Hepatitis C Screening  Completed   PNA vac Low Risk Adult  Completed    Health Maintenance  Health Maintenance Due  Topic Date  Due   COVID-19 Vaccine (2 - Pfizer risk 4-dose series) 03/20/2020    Colorectal cancer screening: Type of screening: Colonoscopy. Completed 02/21/2018. Repeat every 5 years  Lung Cancer Screening: (Low Dose CT Chest recommended if Age 42-80 years, 30 pack-year currently smoking OR have quit w/in 15years.) does not qualify.   Lung Cancer Screening Referral: no  Additional Screening:  Hepatitis C Screening: does qualify; Completed yes  Vision Screening: Recommended annual ophthalmology exams for early detection of glaucoma and other disorders of the eye. Is the patient up to date with their annual eye exam?  Yes  Who is the provider or what is the name of the office in which the patient attends annual eye exams? Sharyne Peach, MD. If pt is not established with a provider, would they like to be referred to a provider to establish care? No .   Dental Screening: Recommended annual dental exams for proper oral hygiene  Community Resource Referral / Chronic Care Management: CRR required this visit?  No   CCM required this visit?  No      Plan:     I have personally reviewed and noted the following in the patient's chart:   Medical and social history Use of alcohol, tobacco or illicit drugs  Current medications and supplements Functional ability and status Nutritional status Physical activity Advanced directives List of other physicians Hospitalizations, surgeries, and ER visits in previous 12 months Vitals Screenings to include cognitive, depression, and falls Referrals and appointments  In addition, I  have reviewed and discussed with patient certain preventive protocols, quality metrics, and best practice recommendations. A written personalized care plan for preventive services as well as general preventive health recommendations were provided to patient.     Sheral Flow, LPN   [id number]   Nurse Notes:  Patient is cogitatively intact. There were no vitals filed  for this visit. There is no height or weight on file to calculate BMI. Patient stated that he has no issues with gait or balance; does not use any assistive devices.  Medical screening examination/treatment/procedure(s) were performed by non-physician practitioner and as supervising physician I was immediately available for consultation/collaboration.  I agree with above. Lew Dawes, MD

## 2020-07-08 NOTE — Patient Instructions (Addendum)
[name] , Thank you for taking time to come for your Medicare Wellness Visit. I appreciate your ongoing commitment to your health goals. Please review the following plan we discussed and let me know if I can assist you in the future.   Screening recommendations/referrals: Colonoscopy: 02/21/2018; due every 5 years Recommended yearly ophthalmology/optometry visit for glaucoma screening and checkup Recommended yearly dental visit for hygiene and checkup  Vaccinations: Influenza vaccine: 05/13/2020 Pneumococcal vaccine: up to date Tdap vaccine: 10/04/2019; due every 10 years Shingles vaccine: never done; will check with local pharmacy   Covid-19: up to date  Advanced directives: Please bring a copy of your health care power of attorney and living will to the office at your convenience.  Conditions/risks identified: Yes; Reviewed health maintenance screenings with patient today and relevant education, vaccines, and/or referrals were provided. Please continue to do your personal lifestyle choices by: daily care of teeth and gums, regular physical activity (goal should be 5 days a week for 30 minutes), eat a healthy diet, avoid tobacco and drug use, limiting any alcohol intake, taking a low-dose aspirin (if not allergic or have been advised by your provider otherwise) and taking vitamins and minerals as recommended by your provider. Continue doing brain stimulating activities (puzzles, reading, adult coloring books, staying active) to keep memory sharp. Continue to eat heart healthy diet (full of fruits, vegetables, whole grains, lean protein, water--limit salt, fat, and sugar intake) and increase physical activity as tolerated.  Next appointment: Please schedule your next Medicare Wellness Visit with your Nurse Health Advisor in 1 year by calling [phone].  Preventive Care 76 Years and Older, Male Preventive care refers to lifestyle choices and visits with your health care provider that can promote  health and wellness. What does preventive care include?  A yearly physical exam. This is also called an annual well check.  Dental exams once or twice a year.  Routine eye exams. Ask your health care provider how often you should have your eyes checked.  Personal lifestyle choices, including:  Daily care of your teeth and gums.  Regular physical activity.  Eating a healthy diet.  Avoiding tobacco and drug use.  Limiting alcohol use.  Practicing safe sex.  Taking low doses of aspirin every day.  Taking vitamin and mineral supplements as recommended by your health care provider. What happens during an annual well check? The services and screenings done by your health care provider during your annual well check will depend on your age, overall health, lifestyle risk factors, and family history of disease. Counseling  Your health care provider may ask you questions about your:  Alcohol use.  Tobacco use.  Drug use.  Emotional well-being.  Home and relationship well-being.  Sexual activity.  Eating habits.  History of falls.  Memory and ability to understand (cognition).  Work and work Statistician. Screening  You may have the following tests or measurements:  Height, weight, and BMI.  Blood pressure.  Lipid and cholesterol levels. These may be checked every 5 years, or more frequently if you are over 38 years old.  Skin check.  Lung cancer screening. You may have this screening every year starting at age 65 if you have a 30-pack-year history of smoking and currently smoke or have quit within the past 15 years.  Fecal occult blood test (FOBT) of the stool. You may have this test every year starting at age 5.  Flexible sigmoidoscopy or colonoscopy. You may have a sigmoidoscopy every 5 years or  a colonoscopy every 10 years starting at age 84.  Prostate cancer screening. Recommendations will vary depending on your family history and other risks.  Hepatitis  C blood test.  Hepatitis B blood test.  Sexually transmitted disease (STD) testing.  Diabetes screening. This is done by checking your blood sugar (glucose) after you have not eaten for a while (fasting). You may have this done every 1-3 years.  Abdominal aortic aneurysm (AAA) screening. You may need this if you are a current or former smoker.  Osteoporosis. You may be screened starting at age 46 if you are at high risk. Talk with your health care provider about your test results, treatment options, and if necessary, the need for more tests. Vaccines  Your health care provider may recommend certain vaccines, such as:  Influenza vaccine. This is recommended every year.  Tetanus, diphtheria, and acellular pertussis (Tdap, Td) vaccine. You may need a Td booster every 10 years.  Zoster vaccine. You may need this after age 81.  Pneumococcal 13-valent conjugate (PCV13) vaccine. One dose is recommended after age 65.  Pneumococcal polysaccharide (PPSV23) vaccine. One dose is recommended after age 62. Talk to your health care provider about which screenings and vaccines you need and how often you need them. This information is not intended to replace advice given to you by your health care provider. Make sure you discuss any questions you have with your health care provider. Document Released: 07/18/2015 Document Revised: 03/10/2016 Document Reviewed: 04/22/2015 Elsevier Interactive Patient Education  2017 [employer].  Fall Prevention in the Home Falls can cause injuries. They can happen to people of all ages. There are many things you can do to make your home safe and to help prevent falls. What can I do on the outside of my home?  Regularly fix the edges of walkways and driveways and fix any cracks.  Remove anything that might make you trip as you walk through a door, such as a raised step or threshold.  Trim any bushes or trees on the path to your home.  Use bright outdoor  lighting.  Clear any walking paths of anything that might make someone trip, such as rocks or tools.  Regularly check to see if handrails are loose or broken. Make sure that both sides of any steps have handrails.  Any raised decks and porches should have guardrails on the edges.  Have any leaves, snow, or ice cleared regularly.  Use sand or salt on walking paths during winter.  Clean up any spills in your garage right away. This includes oil or grease spills. What can I do in the bathroom?  Use night lights.  Install grab bars by the toilet and in the tub and shower. Do not use towel bars as grab bars.  Use non-skid mats or decals in the tub or shower.  If you need to sit down in the shower, use a plastic, non-slip stool.  Keep the floor dry. Clean up any water that spills on the floor as soon as it happens.  Remove soap buildup in the tub or shower regularly.  Attach bath mats securely with double-sided non-slip rug tape.  Do not have throw rugs and other things on the floor that can make you trip. What can I do in the bedroom?  Use night lights.  Make sure that you have a light by your bed that is easy to reach.  Do not use any sheets or blankets that are too big for your bed.  They should not hang down onto the floor.  Have a firm chair that has side arms. You can use this for support while you get dressed.  Do not have throw rugs and other things on the floor that can make you trip. What can I do in the kitchen?  Clean up any spills right away.  Avoid walking on wet floors.  Keep items that you use a lot in easy-to-reach places.  If you need to reach something above you, use a strong step stool that has a grab bar.  Keep electrical cords out of the way.  Do not use floor polish or wax that makes floors slippery. If you must use wax, use non-skid floor wax.  Do not have throw rugs and other things on the floor that can make you trip. What can I do with my  stairs?  Do not leave any items on the stairs.  Make sure that there are handrails on both sides of the stairs and use them. Fix handrails that are broken or loose. Make sure that handrails are as long as the stairways.  Check any carpeting to make sure that it is firmly attached to the stairs. Fix any carpet that is loose or worn.  Avoid having throw rugs at the top or bottom of the stairs. If you do have throw rugs, attach them to the floor with carpet tape.  Make sure that you have a light switch at the top of the stairs and the bottom of the stairs. If you do not have them, ask someone to add them for you. What else can I do to help prevent falls?  Wear shoes that:  Do not have high heels.  Have rubber bottoms.  Are comfortable and fit you well.  Are closed at the toe. Do not wear sandals.  If you use a stepladder:  Make sure that it is fully opened. Do not climb a closed stepladder.  Make sure that both sides of the stepladder are locked into place.  Ask someone to hold it for you, if possible.  Clearly mark and make sure that you can see:  Any grab bars or handrails.  First and last steps.  Where the edge of each step is.  Use tools that help you move around (mobility aids) if they are needed. These include:  Canes.  Walkers.  Scooters.  Crutches.  Turn on the lights when you go into a dark area. Replace any light bulbs as soon as they burn out.  Set up your furniture so you have a clear path. Avoid moving your furniture around.  If any of your floors are uneven, fix them.  If there are any pets around you, be aware of where they are.  Review your medicines with your doctor. Some medicines can make you feel dizzy. This can increase your chance of falling. Ask your doctor what other things that you can do to help prevent falls. This information is not intended to replace advice given to you by your health care provider. Make sure you discuss any  questions you have with your health care provider. Document Released: 04/17/2009 Document Revised: 11/27/2015 Document Reviewed: 07/26/2014 Elsevier Interactive Patient Education  2017 [employer].

## 2020-08-12 ENCOUNTER — Ambulatory Visit: Payer: Medicare PPO | Admitting: Internal Medicine

## 2020-08-15 DIAGNOSIS — M353 Polymyalgia rheumatica: Secondary | ICD-10-CM | POA: Diagnosis not present

## 2020-08-15 DIAGNOSIS — M2550 Pain in unspecified joint: Secondary | ICD-10-CM | POA: Diagnosis not present

## 2020-08-15 DIAGNOSIS — Z6826 Body mass index (BMI) 26.0-26.9, adult: Secondary | ICD-10-CM | POA: Diagnosis not present

## 2020-08-15 DIAGNOSIS — E663 Overweight: Secondary | ICD-10-CM | POA: Diagnosis not present

## 2020-08-15 DIAGNOSIS — Z7952 Long term (current) use of systemic steroids: Secondary | ICD-10-CM | POA: Diagnosis not present

## 2020-08-20 DIAGNOSIS — R3915 Urgency of urination: Secondary | ICD-10-CM | POA: Diagnosis not present

## 2020-08-20 DIAGNOSIS — N5201 Erectile dysfunction due to arterial insufficiency: Secondary | ICD-10-CM | POA: Diagnosis not present

## 2020-08-20 DIAGNOSIS — R351 Nocturia: Secondary | ICD-10-CM | POA: Diagnosis not present

## 2020-08-27 ENCOUNTER — Ambulatory Visit: Payer: Medicare PPO | Admitting: Internal Medicine

## 2020-08-27 ENCOUNTER — Encounter: Payer: Self-pay | Admitting: Internal Medicine

## 2020-08-27 ENCOUNTER — Other Ambulatory Visit: Payer: Self-pay

## 2020-08-27 VITALS — BP 130/78 | HR 64 | Temp 97.9°F | Ht 67.0 in | Wt 168.4 lb

## 2020-08-27 DIAGNOSIS — I10 Essential (primary) hypertension: Secondary | ICD-10-CM

## 2020-08-27 DIAGNOSIS — Z23 Encounter for immunization: Secondary | ICD-10-CM | POA: Diagnosis not present

## 2020-08-27 DIAGNOSIS — Z0000 Encounter for general adult medical examination without abnormal findings: Secondary | ICD-10-CM | POA: Diagnosis not present

## 2020-08-27 DIAGNOSIS — R739 Hyperglycemia, unspecified: Secondary | ICD-10-CM

## 2020-08-27 NOTE — Assessment & Plan Note (Signed)

## 2020-08-27 NOTE — Assessment & Plan Note (Signed)
Losartan, Toprol

## 2020-08-27 NOTE — Assessment & Plan Note (Signed)
No angina 

## 2020-08-27 NOTE — Progress Notes (Signed)
Subjective:  Patient ID: Joshua Brown, male    DOB: 07-04-1946  Age: 75 y.o. MRN: [id number]  CC: Annual Exam   HPI Joshua Brown presents for a well exam  Outpatient Medications Prior to Visit  Medication Sig Dispense Refill  . acetaminophen (TYLENOL) 500 MG tablet Take 500 mg by mouth every 6 (six) hours as needed.    . ALPRAZolam (XANAX) 0.5 MG tablet Take 1 tablet (0.5 mg total) by mouth at bedtime as needed for anxiety. 90 tablet 3  . atorvastatin (LIPITOR) 80 MG tablet Take 1 tablet (80 mg total) by mouth daily. 90 tablet 2  . b complex vitamins capsule Take 1 capsule by mouth daily.      . Cholecalciferol (VITAMIN D) 2000 UNIT CAPS Take 2,000 Units by mouth daily.     . clopidogrel (PLAVIX) 75 MG tablet TAKE 1 TABLET EVERY DAY 90 tablet 2  . ezetimibe (ZETIA) 10 MG tablet Take 1 tablet (10 mg total) by mouth daily. 90 tablet 2  . losartan (COZAAR) 25 MG tablet TAKE 1 TABLET (25 MG TOTAL) BY MOUTH DAILY. 90 tablet 2  . metoprolol succinate (TOPROL-XL) 50 MG 24 hr tablet TAKE 1 TABLET BY MOUTH EVERY DAY WITH OR IMMEDIATELY AFTER A MEAL 90 tablet 2  . naproxen (NAPROSYN) 500 MG tablet Take 500 mg by mouth 2 (two) times daily as needed.     . niacin (NIASPAN) 500 MG CR tablet Take 2,000 mg by mouth at bedtime.    . nitroGLYCERIN (NITROSTAT) 0.4 MG SL tablet Place 1 tablet (0.4 mg total) under the tongue every 5 (five) minutes as needed for chest pain. 25 tablet 5  . pantoprazole (PROTONIX) 40 MG tablet Take 1 tablet (40 mg total) by mouth daily. 90 tablet 2  . predniSONE (DELTASONE) 5 MG tablet Take 3 tablets (15 mg total) by mouth daily with breakfast. 90 tablet 1  . triamcinolone cream (KENALOG) 0.1 % Apply 1 application topically as needed.    . finasteride (PROSCAR) 5 MG tablet Take 1 tablet (5 mg total) by mouth daily. (Patient not taking: Reported on 08/27/2020) 90 tablet 3   No facility-administered medications prior to visit.    ROS: Review of Systems  Constitutional:  Negative for appetite change, fatigue and unexpected weight change.  HENT: Negative for congestion, nosebleeds, sneezing, sore throat and trouble swallowing.   Eyes: Negative for itching and visual disturbance.  Respiratory: Negative for cough.   Cardiovascular: Negative for chest pain, palpitations and leg swelling.  Gastrointestinal: Negative for abdominal distention, blood in stool, diarrhea and nausea.  Genitourinary: Negative for frequency and hematuria.  Musculoskeletal: Positive for arthralgias. Negative for back pain, gait problem, joint swelling and neck pain.  Skin: Negative for rash.  Neurological: Negative for dizziness, tremors, speech difficulty and weakness.  Psychiatric/Behavioral: Negative for agitation, dysphoric mood and sleep disturbance. The patient is not nervous/anxious.     Objective:  BP 130/78 (BP Location: Left Arm)   Pulse 64   Temp 97.9 F (36.6 C) (Oral)   Ht 5\' 7"  (1.702 m)   Wt 168 lb 6.4 oz (76.4 kg)   SpO2 99%   BMI 26.38 kg/m   BP Readings from Last 3 Encounters:  08/27/20 130/78  06/19/20 134/80  06/05/20 138/72    Wt Readings from Last 3 Encounters:  08/27/20 168 lb 6.4 oz (76.4 kg)  06/19/20 167 lb (75.8 kg)  05/28/20 167 lb (75.8 kg)    Physical Exam Constitutional:  General: He is not in acute distress.    Appearance: He is well-developed.     Comments: NAD  HENT:     Mouth/Throat:     Mouth: Oropharynx is clear and moist.  Eyes:     Conjunctiva/sclera: Conjunctivae normal.     Pupils: Pupils are equal, round, and reactive to light.  Neck:     Thyroid: No thyromegaly.     Vascular: No JVD.  Cardiovascular:     Rate and Rhythm: Normal rate and regular rhythm.     Pulses: Intact distal pulses.     Heart sounds: Normal heart sounds. No murmur heard. No friction rub. No gallop.   Pulmonary:     Effort: Pulmonary effort is normal. No respiratory distress.     Breath sounds: Normal breath sounds. No wheezing or rales.   Chest:     Chest wall: No tenderness.  Abdominal:     General: Bowel sounds are normal. There is no distension.     Palpations: Abdomen is soft. There is no mass.     Tenderness: There is no abdominal tenderness. There is no guarding or rebound.  Musculoskeletal:        General: No tenderness or edema. Normal range of motion.     Cervical back: Normal range of motion.  Lymphadenopathy:     Cervical: No cervical adenopathy.  Skin:    General: Skin is warm and dry.     Findings: No rash.  Neurological:     Mental Status: He is alert and oriented to person, place, and time.     Cranial Nerves: No cranial nerve deficit.     Motor: No abnormal muscle tone.     Coordination: He displays a negative Romberg sign. Coordination normal.     Gait: Gait normal.     Deep Tendon Reflexes: Reflexes are normal and symmetric.  Psychiatric:        Mood and Affect: Mood and affect normal.        Behavior: Behavior normal.        Thought Content: Thought content normal.        Judgment: Judgment normal.    Rectal - pt had it by Dr Alinda Money   Lab Results  Component Value Date   WBC 8.6 06/02/2020   HGB 14.7 06/02/2020   HCT 44.5 06/02/2020   PLT 218 06/02/2020   GLUCOSE 137 (H) 06/02/2020   CHOL 120 06/02/2020   TRIG 54 06/02/2020   HDL 41 06/02/2020   LDLCALC 67 06/02/2020   ALT 26 06/02/2020   AST 23 06/02/2020   NA 139 06/02/2020   K 4.0 06/02/2020   CL 104 06/02/2020   CREATININE 1.04 06/02/2020   BUN 18 06/02/2020   CO2 23 06/02/2020   TSH 1.860 06/02/2020   PSA 0.57 08/02/2018   INR 1.1 RATIO (H) 05/16/2008   HGBA1C 6.6 (H) 06/02/2020    VAS [place] ABI WITH/WO TBI  Result Date: 05/22/2020 LOWER EXTREMITY DOPPLER STUDY Indications: Peripheral artery disease. High Risk Factors: Hypertension, past history of smoking, prior MI, coronary                    artery disease. Other Factors: Patient denies claudication or rest pain.  Comparison Study: 05/21/19 Performing Technologist:  June Leap RVT  Examination Guidelines: A complete evaluation includes at minimum, Doppler waveform signals and systolic blood pressure reading at the level of bilateral brachial, anterior tibial, and posterior tibial arteries, when vessel segments are  accessible. Bilateral testing is considered an integral part of a complete examination. Photoelectric Plethysmograph (PPG) waveforms and toe systolic pressure readings are included as required and additional duplex testing as needed. Limited examinations for reoccurring indications may be performed as noted.  ABI Findings: +---------+------------------+-----+---------+--------+ Right    Rt Pressure (mmHg)IndexWaveform Comment  +---------+------------------+-----+---------+--------+ Brachial 152                                      +---------+------------------+-----+---------+--------+ ATA      135               0.89 triphasic         +---------+------------------+-----+---------+--------+ PTA      144               0.95 triphasic         +---------+------------------+-----+---------+--------+ PERO     114               0.75 triphasic         +---------+------------------+-----+---------+--------+ Great Toe89                0.59 Abnormal          +---------+------------------+-----+---------+--------+ +---------+------------------+-----+---------+-------+ Left     Lt Pressure (mmHg)IndexWaveform Comment +---------+------------------+-----+---------+-------+ Brachial 142                                     +---------+------------------+-----+---------+-------+ ATA      101               0.66 triphasic        +---------+------------------+-----+---------+-------+ PTA      129               0.85 triphasic        +---------+------------------+-----+---------+-------+ PERO     122               0.80 biphasic         +---------+------------------+-----+---------+-------+ Esau Grew                0.55  Abnormal         +---------+------------------+-----+---------+-------+ +-------+-----------+-----------+------------+------------+ ABI/TBIToday's ABIToday's TBIPrevious ABIPrevious TBI +-------+-----------+-----------+------------+------------+ Right  0.95       0.59       0.99        0.47         +-------+-----------+-----------+------------+------------+ Left   0.85       0.55       0.99        0.62         +-------+-----------+-----------+------------+------------+  Left ABIs appear decreased compared to prior study on 05/21/19.  Summary: Right: Resting right ankle-brachial index is within normal range. No evidence of significant right lower extremity arterial disease. The right toe-brachial index is abnormal. Left: Resting left ankle-brachial index indicates mild left lower extremity arterial disease. The left toe-brachial index is abnormal.  *See table(s) above for measurements and observations.  Suggest follow up study in 12 months. Electronically signed by Larae Grooms MD on 05/22/2020 at 7:57:54 PM.    Final    VAS US AORTA/IVC/ILIACS  Result Date: 05/22/2020 ABDOMINAL AORTA STUDY Risk Factors: Hypertension, past history of smoking, prior MI, coronary artery               disease. Other Factors: Patient denies any claudication or rest  pain. Limitations: Air/bowel gas.  Comparison Study: 05/21/19 Aortoiliac duplex showed >50% stenosis in the Aorta                   and Bilateral common iliac arteries. Performing Technologist: June Leap RDMS, RVT  Examination Guidelines: A complete evaluation includes B-mode imaging, spectral Doppler, color Doppler, and power Doppler as needed of all accessible portions of each vessel. Bilateral testing is considered an integral part of a complete examination. Limited examinations for reoccurring indications may be performed as noted.  Abdominal Aorta Findings: +-------------+-------+----------+----------+---------+--------+--------+ Location      AP (cm)Trans (cm)PSV (cm/s)Waveform ThrombusComments +-------------+-------+----------+----------+---------+--------+--------+ Proximal                      194                                 +-------------+-------+----------+----------+---------+--------+--------+ Mid          1.52   1.65      166                                 +-------------+-------+----------+----------+---------+--------+--------+ Distal                        309                                 +-------------+-------+----------+----------+---------+--------+--------+ RT CIA Prox                   294       triphasic                 +-------------+-------+----------+----------+---------+--------+--------+ RT CIA Mid                    156       triphasic                 +-------------+-------+----------+----------+---------+--------+--------+ RT CIA Distal                 131       triphasic                 +-------------+-------+----------+----------+---------+--------+--------+ RT EIA Prox                   114       biphasic                  +-------------+-------+----------+----------+---------+--------+--------+ RT EIA Mid                    98        biphasic                  +-------------+-------+----------+----------+---------+--------+--------+ RT EIA Distal                 73        biphasic                  +-------------+-------+----------+----------+---------+--------+--------+ LT CIA Prox                   171       biphasic                  +-------------+-------+----------+----------+---------+--------+--------+  LT CIA Mid                    84        biphasic                  +-------------+-------+----------+----------+---------+--------+--------+ LT CIA Distal                 115       biphasic                  +-------------+-------+----------+----------+---------+--------+--------+ LT EIA Prox                    105       biphasic                  +-------------+-------+----------+----------+---------+--------+--------+ LT EIA Mid                    93        biphasic                  +-------------+-------+----------+----------+---------+--------+--------+ LT EIA Distal                 96        biphasic                  +-------------+-------+----------+----------+---------+--------+--------+ Visualization of the Proximal Abdominal Aorta, Mid Abdominal Aorta and Distal Abdominal Aorta was limited.  Summary: Abdominal Aorta: The largest aortic measurement is 1.6 cm. Stenosis: +--------------------+-------------+ Location            Stenosis      +--------------------+-------------+ Distal Aorta        >50% stenosis +--------------------+-------------+ Right Common Iliac  >50% stenosis +--------------------+-------------+ Left Common Iliac   <50% stenosis +--------------------+-------------+ Right External Iliac<50% stenosis +--------------------+-------------+ Left External Iliac <50% stenosis +--------------------+-------------+ Atherosclerosis in the aorta and iliac arteries. Elevated velocities in the aorta and iliac arteries. Essentially stable compared to prior exam. IVC/Iliac: There is no evidence of thrombus involving the IVC.  *See table(s) above for measurements and observations. Suggest follow up study in 12 months.  Electronically signed by Larae Grooms MD on 05/22/2020 at 8:09:50 PM.    Final     Assessment & Plan:

## 2020-08-27 NOTE — Assessment & Plan Note (Signed)
A1c

## 2020-09-01 ENCOUNTER — Other Ambulatory Visit: Payer: Self-pay | Admitting: Internal Medicine

## 2020-09-01 ENCOUNTER — Other Ambulatory Visit (INDEPENDENT_AMBULATORY_CARE_PROVIDER_SITE_OTHER): Payer: Medicare PPO

## 2020-09-01 DIAGNOSIS — Z125 Encounter for screening for malignant neoplasm of prostate: Secondary | ICD-10-CM

## 2020-09-01 DIAGNOSIS — Z0000 Encounter for general adult medical examination without abnormal findings: Secondary | ICD-10-CM | POA: Diagnosis not present

## 2020-09-01 DIAGNOSIS — R739 Hyperglycemia, unspecified: Secondary | ICD-10-CM

## 2020-09-01 DIAGNOSIS — I10 Essential (primary) hypertension: Secondary | ICD-10-CM | POA: Diagnosis not present

## 2020-09-01 LAB — URINALYSIS
Bilirubin Urine: NEGATIVE
Hgb urine dipstick: NEGATIVE
Ketones, ur: NEGATIVE
Leukocytes,Ua: NEGATIVE
Nitrite: NEGATIVE
Specific Gravity, Urine: 1.02 (ref 1.000–1.030)
Total Protein, Urine: NEGATIVE
Urine Glucose: NEGATIVE
Urobilinogen, UA: 0.2 (ref 0.0–1.0)
pH: 7.5 (ref 5.0–8.0)

## 2020-09-01 LAB — COMPREHENSIVE METABOLIC PANEL
ALT: 20 U/L (ref 0–53)
AST: 18 U/L (ref 0–37)
Albumin: 4.2 g/dL (ref 3.5–5.2)
Alkaline Phosphatase: 66 U/L (ref 39–117)
BUN: 16 mg/dL (ref 6–23)
CO2: 28 mEq/L (ref 19–32)
Calcium: 9.7 mg/dL (ref 8.4–10.5)
Chloride: 103 mEq/L (ref 96–112)
Creatinine, Ser: 0.96 mg/dL (ref 0.40–1.50)
GFR: 77.12 mL/min (ref >60.00)
Glucose, Bld: 133 mg/dL — ABNORMAL HIGH (ref 70–99)
Potassium: 5 mEq/L (ref 3.5–5.1)
Sodium: 141 mEq/L (ref 135–145)
Total Bilirubin: 0.5 mg/dL (ref 0.2–1.2)
Total Protein: 6.5 g/dL (ref 6.0–8.3)

## 2020-09-01 LAB — CBC WITH DIFFERENTIAL/PLATELET
Basophils Absolute: 0 10*3/uL (ref 0.0–0.1)
Basophils Relative: 0.5 % (ref 0.0–3.0)
Eosinophils Absolute: 0.1 10*3/uL (ref 0.0–0.7)
Eosinophils Relative: 1.3 % (ref 0.0–5.0)
HCT: 45 % (ref 39.0–52.0)
Hemoglobin: 15 g/dL (ref 13.0–17.0)
Lymphocytes Relative: 20.4 % (ref 12.0–46.0)
Lymphs Abs: 1.8 10*3/uL (ref 0.7–4.0)
MCHC: 33.4 g/dL (ref 30.0–36.0)
MCV: 88.6 fl (ref 78.0–100.0)
Monocytes Absolute: 0.6 10*3/uL (ref 0.1–1.0)
Monocytes Relative: 6.9 % (ref 3.0–12.0)
Neutro Abs: 6.3 10*3/uL (ref 1.4–7.7)
Neutrophils Relative %: 70.9 % (ref 43.0–77.0)
Platelets: 154 10*3/uL (ref 150.0–400.0)
RBC: 5.08 Mil/uL (ref 4.22–5.81)
RDW: 16.8 % — ABNORMAL HIGH (ref 11.5–15.5)
WBC: 8.8 10*3/uL (ref 4.0–10.5)

## 2020-09-01 LAB — LIPID PANEL
Cholesterol: 137 mg/dL (ref 0–200)
HDL: 54.4 mg/dL (ref >39.00)
LDL Cholesterol: 68 mg/dL (ref 0–99)
NonHDL: 82.14
Total CHOL/HDL Ratio: 3
Triglycerides: 72 mg/dL (ref 0.0–149.0)
VLDL: 14.4 mg/dL (ref 0.0–40.0)

## 2020-09-01 LAB — TSH: TSH: 1.12 u[IU]/mL (ref 0.35–4.50)

## 2020-09-01 LAB — HEMOGLOBIN A1C: Hgb A1c MFr Bld: 6.9 % — ABNORMAL HIGH (ref 4.6–6.5)

## 2020-09-01 LAB — PSA: PSA: 0.53 ng/mL (ref 0.10–4.00)

## 2020-09-03 ENCOUNTER — Other Ambulatory Visit: Payer: Self-pay | Admitting: Internal Medicine

## 2020-09-04 ENCOUNTER — Other Ambulatory Visit: Payer: Self-pay | Admitting: Cardiovascular Disease

## 2020-09-12 ENCOUNTER — Other Ambulatory Visit: Payer: Self-pay | Admitting: *Deleted

## 2020-09-12 MED ORDER — NITROGLYCERIN 0.4 MG SL SUBL: 0.4000 mg | SUBLINGUAL_TABLET | SUBLINGUAL | 5 refills | Status: DC | PRN

## 2020-09-29 DIAGNOSIS — E663 Overweight: Secondary | ICD-10-CM | POA: Diagnosis not present

## 2020-09-29 DIAGNOSIS — Z7952 Long term (current) use of systemic steroids: Secondary | ICD-10-CM | POA: Diagnosis not present

## 2020-09-29 DIAGNOSIS — M353 Polymyalgia rheumatica: Secondary | ICD-10-CM | POA: Diagnosis not present

## 2020-09-29 DIAGNOSIS — Z6826 Body mass index (BMI) 26.0-26.9, adult: Secondary | ICD-10-CM | POA: Diagnosis not present

## 2020-11-25 ENCOUNTER — Telehealth: Payer: Self-pay | Admitting: Internal Medicine

## 2020-11-25 NOTE — Telephone Encounter (Signed)
Patient called and said that he is out of town and he left his medicine at home. He was wondering if Dr.Plotnikov could send a prescription for ALPRAZolam (XANAX) 0.5 MG tablet for 1 tablet. He said that he will be returning home tomorrow. It can be sent to [facility] [address] [place], [place] [address]. Please advise   Phone: [phone]

## 2020-11-26 MED ORDER — ALPRAZOLAM 0.5 MG PO TABS: 0.5000 mg | ORAL_TABLET | Freq: Every evening | ORAL | 0 refills | Status: DC | PRN

## 2020-11-26 NOTE — Telephone Encounter (Signed)
Okay.  Thanks.

## 2020-11-26 NOTE — Telephone Encounter (Signed)
Called pt there ws no answer LMOM MD sent rx to the requested pharmacy.Marland KitchenJohny Chess

## 2020-11-27 DIAGNOSIS — N401 Enlarged prostate with lower urinary tract symptoms: Secondary | ICD-10-CM | POA: Diagnosis not present

## 2020-11-27 DIAGNOSIS — R351 Nocturia: Secondary | ICD-10-CM | POA: Diagnosis not present

## 2020-12-05 ENCOUNTER — Ambulatory Visit: Payer: Medicare PPO | Admitting: Pulmonary Disease

## 2020-12-10 ENCOUNTER — Other Ambulatory Visit: Payer: Self-pay | Admitting: Internal Medicine

## 2020-12-29 DIAGNOSIS — E663 Overweight: Secondary | ICD-10-CM | POA: Diagnosis not present

## 2020-12-29 DIAGNOSIS — M7989 Other specified soft tissue disorders: Secondary | ICD-10-CM | POA: Diagnosis not present

## 2020-12-29 DIAGNOSIS — Z7952 Long term (current) use of systemic steroids: Secondary | ICD-10-CM | POA: Diagnosis not present

## 2020-12-29 DIAGNOSIS — M353 Polymyalgia rheumatica: Secondary | ICD-10-CM | POA: Diagnosis not present

## 2020-12-29 DIAGNOSIS — Z6827 Body mass index (BMI) 27.0-27.9, adult: Secondary | ICD-10-CM | POA: Diagnosis not present

## 2021-01-14 ENCOUNTER — Other Ambulatory Visit: Payer: Self-pay

## 2021-01-14 ENCOUNTER — Ambulatory Visit: Payer: Medicare PPO | Admitting: Pulmonary Disease

## 2021-01-14 ENCOUNTER — Encounter: Payer: Self-pay | Admitting: Pulmonary Disease

## 2021-01-14 ENCOUNTER — Telehealth: Payer: Self-pay | Admitting: *Deleted

## 2021-01-14 VITALS — BP 114/68 | HR 72 | Temp 98.3°F | Ht 67.0 in | Wt 166.6 lb

## 2021-01-14 DIAGNOSIS — G4733 Obstructive sleep apnea (adult) (pediatric): Secondary | ICD-10-CM

## 2021-01-14 NOTE — Telephone Encounter (Signed)
Called and spoke with Brad with [employer], he states that the patient will have to bring his machine into the office to get the download.  He will have the staff with [employer] call him and set up a time for him to do this.  Nothing further needed.

## 2021-01-14 NOTE — Patient Instructions (Addendum)
CPAP report will be checked  Call us for any issues

## 2021-01-14 NOTE — Progress Notes (Signed)
Subjective:    Patient ID: Joshua Brown, male    DOB: 1945-11-08, 75 y.o.   MRN: [id number]  HPI  74 year old presents to reestablish care for OSA Last seen 11/2017   PMH -CAD, polymyalgia rheumatica  Chief Complaint  Patient presents with   Consult    Diagnosed with osa several years ago.  Has not here in several years.  Wears a cpap machine.   Interim he was diagnosed with polymyalgia rheumatica, prednisone was started at 15 mg and has gradually been tapered to 6 mg, Dr. Amil Amen managing  OSA was diagnosed in 2011 when he weighed 191 pounds, he is currently lost 25 pounds to his current weight of 166 pounds however repeat HST in 2017 and also in 2019 continue to show moderate OSA Epworth sleepiness score is 5 Bedtime is between 9 and 10 PM, sleep latency is 10 minutes, sleeps on his left side with 1 pillow, reports 4-5 nocturnal awakenings including nocturia and is out of bed by 7 AM feeling rested without dryness of mouth or headaches. He has settled down with a nasal mask and CPAP is certainly helped improve his daytime somnolence and fatigue   Significant tests/ events reviewed  12/2017 HST moderate OSA, AHI 18/hour, worse when supine, lowest desaturation 80%  2011- PSG-191 lbs- AHI 17/h -predom with supine REM   02/2016 HST >  AHI: 21.7/hr; lowest Sat 80%  Past Medical History:  Diagnosis Date   ABDOMINAL AORTIC ANEURYSM 11/12/2008   has annual U/s   Atypical mole 03/08/2018   mild atypia on R lower abdomen - no treatment   CAD (coronary artery disease)    premature   DDD (degenerative disc disease)    ED (erectile dysfunction)    Frequent urination at night    nocturia 2-3   GERD (gastroesophageal reflux disease)    Hyperglycemia    Borderline   Hyperlipidemia    Atherogenic   Hypertension    Myocardial infarct, old    Obesity    Class I   PVD (peripheral vascular disease) ([place])    ABI June '12 -0.9 bilateral.    .psh  No Known Allergies  Social  History   Socioeconomic History   Marital status: Married    Spouse name: Not on file   Number of children: 3   Years of education: 77   Highest education level: Not on file  Occupational History   Occupation: REAL Lexicographer: SELF EMPLOYED    Comment: 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  Tobacco Use   Smoking status: Former    Packs/day: 1.00    Years: 16.00    Pack years: 16.00    Types: Cigarettes    Quit date: 07/05/1969    Years since quitting: 51.5   Smokeless tobacco: Never  Vaping Use   Vaping Use: Never used  Substance and Sexual Activity   Alcohol use: Yes    Alcohol/week: 2.0 standard drinks    Types: 2 Standard drinks or equivalent per week    Comment: socially   Drug use: No   Sexual activity: Yes    Partners: Female  Other Topics Concern   Not on file  Social History Narrative   HSG, [employer] 1 year. Married 1977. 1 son - 64; 2 daughters - 35, 19; 3  Grandchildren. 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 Marriage in good health. ACP - discussed. Provided packet May '13.   Social Determinants of Health  Financial Resource Strain: Low Risk    Difficulty of Paying Living Expenses: Not hard at all  Food Insecurity: No Food Insecurity   Worried About Charity fundraiser in the Last Year: Never true   Ran Out of Food in the Last Year: Never true  Transportation Needs: No Transportation Needs   Lack of Transportation (Medical): No   Lack of Transportation (Non-Medical): No  Physical Activity: Sufficiently Active   Days of Exercise per Week: 5 days   Minutes of Exercise per Session: 30 min  Stress: No Stress Concern Present   Feeling of Stress : Not at all  Social Connections: Socially Integrated   Frequency of Communication with Friends and Family: More than three times a week   Frequency of Social Gatherings with Friends and Family: Once a week   Attends Religious Services: More than 4 times per year   Active Member of [employer] or Organizations:  Yes   Attends Music therapist: More than 4 times per year   Marital Status: Married  Human resources officer Violence: Not on file    Family History  Problem Relation Age of Onset   Cancer Mother        Jaw Cancer   Hyperlipidemia Mother    Congestive Heart Failure Mother    Early death Father    Heart disease Father    CAD Father    Heart attack Father    Heart disease Paternal Grandfather    CAD Paternal Grandfather    Heart attack Paternal Grandfather    Prostate cancer Brother      Review of Systems Complains of muscle pains, joint stiffness relieved by prednisone  Constitutional: negative for anorexia, fevers and sweats  Eyes: negative for irritation, redness and visual disturbance  Ears, nose, mouth, throat, and face: negative for earaches, epistaxis, nasal congestion and sore throat  Respiratory: negative for cough, dyspnea on exertion, sputum and wheezing  Cardiovascular: negative for chest pain, dyspnea, lower extremity edema, orthopnea, palpitations and syncope  Gastrointestinal: negative for abdominal pain, constipation, diarrhea, melena, nausea and vomiting  Genitourinary:negative for dysuria, frequency and hematuria  Hematologic/lymphatic: negative for bleeding, easy bruising and lymphadenopathy   Neurological: negative for coordination problems, gait problems, headaches and weakness  Endocrine: negative for diabetic symptoms including polydipsia, polyuria and weight loss     Objective:   Physical Exam   Gen. Pleasant, elderly, well-nourished, in no distress, normal affect ENT - no pallor,icterus, no post nasal drip Neck: No JVD, no thyromegaly, no carotid bruits Lungs: no use of accessory muscles, no dullness to percussion, clear without rales or rhonchi  Cardiovascular: Rhythm regular, heart sounds  normal, no murmurs or gallops, no peripheral edema Abdomen: soft and non-tender, no hepatosplenomegaly, BS normal. Musculoskeletal: No deformities,  no cyanosis or clubbing Neuro:  alert, non focal        Assessment & Plan:

## 2021-01-14 NOTE — Assessment & Plan Note (Signed)
We reviewed prior CPAP download which shows good control of events on auto CPAP settings 5 to 15 cm with average pressure of 10 to 11 cm, no residual events and minimal leak.  He was very compliant with his CPAP machine.  CPAP supplies will be renewed for a year.  He will be eligible for CPAP replacement if needed We will obtain download on his CPAP machine  Weight loss encouraged, compliance with goal of at least 4-6 hrs every night is the expectation. Advised against medications with sedative side effects Cautioned against driving when sleepy - understanding that sleepiness will vary on a day to day basis

## 2021-02-19 ENCOUNTER — Telehealth: Payer: Self-pay

## 2021-02-19 NOTE — Telephone Encounter (Signed)
Patient called in to schedule with dr Burt Knack. Inform that this was not the right office.

## 2021-02-24 ENCOUNTER — Ambulatory Visit: Payer: Medicare PPO | Admitting: Internal Medicine

## 2021-02-24 ENCOUNTER — Other Ambulatory Visit: Payer: Self-pay

## 2021-02-24 ENCOUNTER — Encounter: Payer: Self-pay | Admitting: Internal Medicine

## 2021-02-24 VITALS — BP 120/62 | HR 58 | Temp 98.2°F | Ht 67.0 in | Wt 166.0 lb

## 2021-02-24 DIAGNOSIS — N320 Bladder-neck obstruction: Secondary | ICD-10-CM | POA: Diagnosis not present

## 2021-02-24 DIAGNOSIS — I2583 Coronary atherosclerosis due to lipid rich plaque: Secondary | ICD-10-CM

## 2021-02-24 DIAGNOSIS — I10 Essential (primary) hypertension: Secondary | ICD-10-CM | POA: Diagnosis not present

## 2021-02-24 DIAGNOSIS — R739 Hyperglycemia, unspecified: Secondary | ICD-10-CM | POA: Diagnosis not present

## 2021-02-24 DIAGNOSIS — I2510 Atherosclerotic heart disease of native coronary artery without angina pectoris: Secondary | ICD-10-CM

## 2021-02-24 DIAGNOSIS — M353 Polymyalgia rheumatica: Secondary | ICD-10-CM

## 2021-02-24 DIAGNOSIS — R944 Abnormal results of kidney function studies: Secondary | ICD-10-CM

## 2021-02-24 DIAGNOSIS — F411 Generalized anxiety disorder: Secondary | ICD-10-CM

## 2021-02-24 LAB — COMPREHENSIVE METABOLIC PANEL
ALT: 21 U/L (ref 0–53)
AST: 24 U/L (ref 0–37)
Albumin: 4.3 g/dL (ref 3.5–5.2)
Alkaline Phosphatase: 72 U/L (ref 39–117)
BUN: 20 mg/dL (ref 6–23)
CO2: 31 mEq/L (ref 19–32)
Calcium: 9.8 mg/dL (ref 8.4–10.5)
Chloride: 102 mEq/L (ref 96–112)
Creatinine, Ser: 1.21 mg/dL (ref 0.40–1.50)
GFR: 58.22 mL/min — ABNORMAL LOW (ref >60.00)
Glucose, Bld: 97 mg/dL (ref 70–99)
Potassium: 4.9 mEq/L (ref 3.5–5.1)
Sodium: 139 mEq/L (ref 135–145)
Total Bilirubin: 0.5 mg/dL (ref 0.2–1.2)
Total Protein: 7 g/dL (ref 6.0–8.3)

## 2021-02-24 LAB — HEMOGLOBIN A1C: Hgb A1c MFr Bld: 7 % — ABNORMAL HIGH (ref 4.6–6.5)

## 2021-02-24 MED ORDER — NITROGLYCERIN 0.4 MG SL SUBL: 0.4000 mg | SUBLINGUAL_TABLET | SUBLINGUAL | 3 refills | Status: DC | PRN

## 2021-02-24 NOTE — Assessment & Plan Note (Signed)
On alprazolam low dose prn  Potential benefits of a long term benzodiazepines  use as well as potential risks  and complications were explained to the patient and were aknowledged.

## 2021-02-24 NOTE — Assessment & Plan Note (Signed)
Cont on Lipitor, Plavix, Losartan, Toprol, Zetia Niacin 

## 2021-02-24 NOTE — Addendum Note (Signed)
Addended by: Jacobo Forest on: 02/24/2021 09:39 AM   Modules accepted: Orders

## 2021-02-24 NOTE — Assessment & Plan Note (Signed)
Doing well  - on 5 mg Predn now

## 2021-02-24 NOTE — Assessment & Plan Note (Addendum)
Doing ok off Rx. Not interested in surgery

## 2021-02-24 NOTE — Assessment & Plan Note (Signed)
Losartan, Toprol

## 2021-02-24 NOTE — Progress Notes (Signed)
Subjective:  Patient ID: Joshua Brown, male    DOB: 1946/06/17  Age: 75 y.o. MRN: [id number]  CC: Follow-up (6 MONTH F/U)   HPI Joshua Brown presents for CAD, dyslipidemia, HTN, PMR - on 5 mg Predn now   Outpatient Medications Prior to Visit  Medication Sig Dispense Refill   atorvastatin (LIPITOR) 80 MG tablet TAKE 1 TABLET EVERY DAY 90 tablet 2   b complex vitamins capsule Take 1 capsule by mouth daily.       Cholecalciferol (VITAMIN D) 2000 UNIT CAPS Take 2,000 Units by mouth daily.      clopidogrel (PLAVIX) 75 MG tablet TAKE 1 TABLET EVERY DAY 90 tablet 2   ezetimibe (ZETIA) 10 MG tablet TAKE 1 TABLET EVERY DAY 90 tablet 2   losartan (COZAAR) 25 MG tablet TAKE 1 TABLET (25 MG TOTAL) BY MOUTH DAILY. 90 tablet 2   metoprolol succinate (TOPROL-XL) 50 MG 24 hr tablet TAKE 1 TABLET BY MOUTH EVERY DAY WITH OR IMMEDIATELY AFTER A MEAL 90 tablet 2   niacin (NIASPAN) 500 MG CR tablet Take 2,000 mg by mouth at bedtime.     pantoprazole (PROTONIX) 40 MG tablet TAKE 1 TABLET EVERY DAY 90 tablet 2   predniSONE (DELTASONE) 5 MG tablet TAKE 3 TABLETS (15 MG TOTAL) BY MOUTH DAILY WITH BREAKFAST. (Patient taking differently: Take 6 mg by mouth daily with breakfast. Weaning down) 90 tablet 0   triamcinolone cream (KENALOG) 0.1 % Apply 1 application topically as needed.     nitroGLYCERIN (NITROSTAT) 0.4 MG SL tablet Place 1 tablet (0.4 mg total) under the tongue every 5 (five) minutes as needed for chest pain. 25 tablet 5   acetaminophen (TYLENOL) 500 MG tablet Take 500 mg by mouth every 6 (six) hours as needed. (Patient not taking: Reported on 01/14/2021)     ALPRAZolam (XANAX) 0.5 MG tablet Take 1 tablet (0.5 mg total) by mouth at bedtime as needed for anxiety. (Patient not taking: No sig reported) 3 tablet 0   No facility-administered medications prior to visit.    ROS: Review of Systems  Constitutional:  Negative for appetite change, fatigue and unexpected weight change.  HENT:  Negative for  congestion, nosebleeds, sneezing, sore throat and trouble swallowing.   Eyes:  Negative for itching and visual disturbance.  Respiratory:  Negative for cough.   Cardiovascular:  Negative for chest pain, palpitations and leg swelling.  Gastrointestinal:  Negative for abdominal distention, blood in stool, diarrhea and nausea.  Genitourinary:  Negative for frequency and hematuria.  Musculoskeletal:  Negative for back pain, gait problem, joint swelling and neck pain.  Skin:  Negative for rash.  Neurological:  Negative for dizziness, tremors, speech difficulty and weakness.  Psychiatric/Behavioral:  Negative for agitation, dysphoric mood, sleep disturbance and suicidal ideas. The patient is not nervous/anxious.    Objective:  BP 120/62 (BP Location: Left Arm)   Pulse (!) 58   Temp 98.2 F (36.8 C) (Oral)   Ht '5\' 7"'$  (1.702 m)   Wt 166 lb (75.3 kg)   SpO2 98%   BMI 26.00 kg/m   BP Readings from Last 3 Encounters:  02/24/21 120/62  01/14/21 114/68  08/27/20 130/78    Wt Readings from Last 3 Encounters:  02/24/21 166 lb (75.3 kg)  01/14/21 166 lb 9.6 oz (75.6 kg)  08/27/20 168 lb 6.4 oz (76.4 kg)    Physical Exam Constitutional:      General: He is not in acute distress.  Appearance: He is well-developed.     Comments: NAD  Eyes:     Conjunctiva/sclera: Conjunctivae normal.     Pupils: Pupils are equal, round, and reactive to light.  Neck:     Thyroid: No thyromegaly.     Vascular: No JVD.  Cardiovascular:     Rate and Rhythm: Normal rate and regular rhythm.     Heart sounds: Normal heart sounds. No murmur heard.   No friction rub. No gallop.  Pulmonary:     Effort: Pulmonary effort is normal. No respiratory distress.     Breath sounds: Normal breath sounds. No wheezing or rales.  Chest:     Chest wall: No tenderness.  Abdominal:     General: Bowel sounds are normal. There is no distension.     Palpations: Abdomen is soft. There is no mass.     Tenderness: There is  no abdominal tenderness. There is no guarding or rebound.  Musculoskeletal:        General: No tenderness. Normal range of motion.     Cervical back: Normal range of motion.  Lymphadenopathy:     Cervical: No cervical adenopathy.  Skin:    General: Skin is warm and dry.     Findings: No rash.  Neurological:     Mental Status: He is alert and oriented to person, place, and time.     Cranial Nerves: No cranial nerve deficit.     Motor: No abnormal muscle tone.     Coordination: Coordination normal.     Gait: Gait normal.     Deep Tendon Reflexes: Reflexes are normal and symmetric.  Psychiatric:        Behavior: Behavior normal.        Thought Content: Thought content normal.        Judgment: Judgment normal.    Lab Results  Component Value Date   WBC 8.8 09/01/2020   HGB 15.0 09/01/2020   HCT 45.0 09/01/2020   PLT 154.0 09/01/2020   GLUCOSE 133 (H) 09/01/2020   CHOL 137 09/01/2020   TRIG 72.0 09/01/2020   HDL 54.40 09/01/2020   LDLCALC 68 09/01/2020   ALT 20 09/01/2020   AST 18 09/01/2020   NA 141 09/01/2020   K 5.0 09/01/2020   CL 103 09/01/2020   CREATININE 0.96 09/01/2020   BUN 16 09/01/2020   CO2 28 09/01/2020   TSH 1.12 09/01/2020   PSA 0.53 09/01/2020   INR 1.1 RATIO (H) 05/16/2008   HGBA1C 6.9 (H) 09/01/2020    VAS [place] ABI WITH/WO TBI  Result Date: 05/22/2020 LOWER EXTREMITY DOPPLER STUDY Indications: Peripheral artery disease. High Risk Factors: Hypertension, past history of smoking, prior MI, coronary                    artery disease. Other Factors: Patient denies claudication or rest pain.  Comparison Study: 05/21/19 Performing Technologist: June Leap RVT  Examination Guidelines: A complete evaluation includes at minimum, Doppler waveform signals and systolic blood pressure reading at the level of bilateral brachial, anterior tibial, and posterior tibial arteries, when vessel segments are accessible. Bilateral testing is considered an integral part of a  complete examination. Photoelectric Plethysmograph (PPG) waveforms and toe systolic pressure readings are included as required and additional duplex testing as needed. Limited examinations for reoccurring indications may be performed as noted.  ABI Findings: +---------+------------------+-----+---------+--------+ Right    Rt Pressure (mmHg)IndexWaveform Comment  +---------+------------------+-----+---------+--------+ Brachial 152                                      +---------+------------------+-----+---------+--------+  ATA      135               0.89 triphasic         +---------+------------------+-----+---------+--------+ PTA      144               0.95 triphasic         +---------+------------------+-----+---------+--------+ PERO     114               0.75 triphasic         +---------+------------------+-----+---------+--------+ Great Toe89                0.59 Abnormal          +---------+------------------+-----+---------+--------+ +---------+------------------+-----+---------+-------+ Left     Lt Pressure (mmHg)IndexWaveform Comment +---------+------------------+-----+---------+-------+ Brachial 142                                     +---------+------------------+-----+---------+-------+ ATA      101               0.66 triphasic        +---------+------------------+-----+---------+-------+ PTA      129               0.85 triphasic        +---------+------------------+-----+---------+-------+ PERO     122               0.80 biphasic         +---------+------------------+-----+---------+-------+ Esau Grew                0.55 Abnormal         +---------+------------------+-----+---------+-------+ +-------+-----------+-----------+------------+------------+ ABI/TBIToday's ABIToday's TBIPrevious ABIPrevious TBI +-------+-----------+-----------+------------+------------+ Right  0.95       0.59       0.99        0.47          +-------+-----------+-----------+------------+------------+ Left   0.85       0.55       0.99        0.62         +-------+-----------+-----------+------------+------------+  Left ABIs appear decreased compared to prior study on 05/21/19.  Summary: Right: Resting right ankle-brachial index is within normal range. No evidence of significant right lower extremity arterial disease. The right toe-brachial index is abnormal. Left: Resting left ankle-brachial index indicates mild left lower extremity arterial disease. The left toe-brachial index is abnormal.  *See table(s) above for measurements and observations.  Suggest follow up study in 12 months. Electronically signed by Larae Grooms MD on 05/22/2020 at 7:57:54 PM.    Final    VAS US AORTA/IVC/ILIACS  Result Date: 05/22/2020 ABDOMINAL AORTA STUDY Risk Factors: Hypertension, past history of smoking, prior MI, coronary artery               disease. Other Factors: Patient denies any claudication or rest pain. Limitations: Air/bowel gas.  Comparison Study: 05/21/19 Aortoiliac duplex showed >50% stenosis in the Aorta                   and Bilateral common iliac arteries. Performing Technologist: June Leap RDMS, RVT  Examination Guidelines: A complete evaluation includes B-mode imaging, spectral Doppler, color Doppler, and power Doppler as needed of all accessible portions of each vessel. Bilateral testing is considered an integral part of a complete examination. Limited examinations for reoccurring indications may be performed as noted.  Abdominal Aorta Findings: +-------------+-------+----------+----------+---------+--------+--------+  Location     AP (cm)Trans (cm)PSV (cm/s)Waveform ThrombusComments +-------------+-------+----------+----------+---------+--------+--------+ Proximal                      194                                 +-------------+-------+----------+----------+---------+--------+--------+ Mid          1.52   1.65       166                                 +-------------+-------+----------+----------+---------+--------+--------+ Distal                        309                                 +-------------+-------+----------+----------+---------+--------+--------+ RT CIA Prox                   294       triphasic                 +-------------+-------+----------+----------+---------+--------+--------+ RT CIA Mid                    156       triphasic                 +-------------+-------+----------+----------+---------+--------+--------+ RT CIA Distal                 131       triphasic                 +-------------+-------+----------+----------+---------+--------+--------+ RT EIA Prox                   114       biphasic                  +-------------+-------+----------+----------+---------+--------+--------+ RT EIA Mid                    98        biphasic                  +-------------+-------+----------+----------+---------+--------+--------+ RT EIA Distal                 73        biphasic                  +-------------+-------+----------+----------+---------+--------+--------+ LT CIA Prox                   171       biphasic                  +-------------+-------+----------+----------+---------+--------+--------+ LT CIA Mid                    84        biphasic                  +-------------+-------+----------+----------+---------+--------+--------+ LT CIA Distal                 115       biphasic                  +-------------+-------+----------+----------+---------+--------+--------+ LT  EIA Prox                   105       biphasic                  +-------------+-------+----------+----------+---------+--------+--------+ LT EIA Mid                    93        biphasic                  +-------------+-------+----------+----------+---------+--------+--------+ LT EIA Distal                 96        biphasic                   +-------------+-------+----------+----------+---------+--------+--------+ Visualization of the Proximal Abdominal Aorta, Mid Abdominal Aorta and Distal Abdominal Aorta was limited.  Summary: Abdominal Aorta: The largest aortic measurement is 1.6 cm. Stenosis: +--------------------+-------------+ Location            Stenosis      +--------------------+-------------+ Distal Aorta        >50% stenosis +--------------------+-------------+ Right Common Iliac  >50% stenosis +--------------------+-------------+ Left Common Iliac   <50% stenosis +--------------------+-------------+ Right External Iliac<50% stenosis +--------------------+-------------+ Left External Iliac <50% stenosis +--------------------+-------------+ Atherosclerosis in the aorta and iliac arteries. Elevated velocities in the aorta and iliac arteries. Essentially stable compared to prior exam. IVC/Iliac: There is no evidence of thrombus involving the IVC.  *See table(s) above for measurements and observations. Suggest follow up study in 12 months.  Electronically signed by Larae Grooms MD on 05/22/2020 at 8:09:50 PM.    Final     Assessment & Plan:   There are no diagnoses linked to this encounter.   Meds ordered this encounter  Medications   nitroGLYCERIN (NITROSTAT) 0.4 MG SL tablet    Sig: Place 1 tablet (0.4 mg total) under the tongue every 5 (five) minutes as needed for chest pain.    Dispense:  25 tablet    Refill:  3     Follow-up: No follow-ups on file.  Walker Kehr, MD

## 2021-02-27 DIAGNOSIS — R944 Abnormal results of kidney function studies: Secondary | ICD-10-CM | POA: Insufficient documentation

## 2021-02-27 NOTE — Addendum Note (Signed)
Addended by: Cassandria Anger on: 02/27/2021 09:05 AM   Modules accepted: Orders

## 2021-02-27 NOTE — Assessment & Plan Note (Signed)
Probable chronic renal insufficiency stage II.  Hydrate well.  Obtain renal ultrasound

## 2021-03-02 ENCOUNTER — Ambulatory Visit
Admission: RE | Admit: 2021-03-02 | Discharge: 2021-03-02 | Disposition: A | Discharge status: Home / Self Care | Payer: Medicare PPO | Source: Ambulatory Visit | Attending: Internal Medicine | Admitting: Internal Medicine

## 2021-03-02 DIAGNOSIS — N281 Cyst of kidney, acquired: Secondary | ICD-10-CM | POA: Diagnosis not present

## 2021-03-02 DIAGNOSIS — R944 Abnormal results of kidney function studies: Secondary | ICD-10-CM

## 2021-03-02 DIAGNOSIS — I10 Essential (primary) hypertension: Secondary | ICD-10-CM

## 2021-03-04 DIAGNOSIS — Z01110 Encounter for hearing examination following failed hearing screening: Secondary | ICD-10-CM | POA: Diagnosis not present

## 2021-03-10 ENCOUNTER — Other Ambulatory Visit: Payer: Self-pay | Admitting: Cardiovascular Disease

## 2021-04-06 DIAGNOSIS — M353 Polymyalgia rheumatica: Secondary | ICD-10-CM | POA: Diagnosis not present

## 2021-04-06 DIAGNOSIS — Z6827 Body mass index (BMI) 27.0-27.9, adult: Secondary | ICD-10-CM | POA: Diagnosis not present

## 2021-04-06 DIAGNOSIS — E663 Overweight: Secondary | ICD-10-CM | POA: Diagnosis not present

## 2021-04-06 DIAGNOSIS — Z7952 Long term (current) use of systemic steroids: Secondary | ICD-10-CM | POA: Diagnosis not present

## 2021-05-21 ENCOUNTER — Other Ambulatory Visit: Payer: Self-pay

## 2021-05-21 ENCOUNTER — Ambulatory Visit (HOSPITAL_COMMUNITY)
Admission: RE | Admit: 2021-05-21 | Discharge: 2021-05-21 | Disposition: A | Discharge status: Home / Self Care | Payer: Medicare PPO | Source: Ambulatory Visit | Attending: Cardiology | Admitting: Cardiology

## 2021-05-21 ENCOUNTER — Ambulatory Visit (HOSPITAL_BASED_OUTPATIENT_CLINIC_OR_DEPARTMENT_OTHER)
Admission: RE | Admit: 2021-05-21 | Discharge: 2021-05-21 | Disposition: A | Discharge status: Home / Self Care | Payer: Medicare PPO | Source: Ambulatory Visit | Attending: Cardiovascular Disease | Admitting: Cardiovascular Disease

## 2021-05-21 DIAGNOSIS — I739 Peripheral vascular disease, unspecified: Secondary | ICD-10-CM

## 2021-05-27 ENCOUNTER — Telehealth: Payer: Self-pay | Admitting: Cardiovascular Disease

## 2021-05-27 NOTE — Telephone Encounter (Signed)
Pt returning phone call... please advise  

## 2021-05-27 NOTE — Telephone Encounter (Signed)
Joshua Mocha, MD  05/27/2021  2:31 PM EST     Mild reduction in blood flow compared to prior study. OK to review when he returns for regular follow-up in February unless he is having progressive symptoms of leg pain and fatigue. If worsening symptoms, please schedule him to return for sooner follow-up with me to review. thanks  The patient has been notified of the result and verbalized understanding.  All questions (if any) were answered. Joshua Iba, RN 05/27/2021 4:58 PM  Patient is not having any symptoms of leg pain or fatigue. He will let us know if he develops any symptoms.

## 2021-06-08 ENCOUNTER — Other Ambulatory Visit: Payer: Self-pay | Admitting: Cardiovascular Disease

## 2021-07-20 ENCOUNTER — Telehealth: Payer: Self-pay | Admitting: Internal Medicine

## 2021-07-20 NOTE — Telephone Encounter (Signed)
Left message for patient to call back to schedule Medicare Annual Wellness Visit   Last AWV  07/08/20  Please schedule at anytime with [facility] if patient calls the office back.    40 Minutes appointment   Any questions, please call me at [phone]

## 2021-07-29 DIAGNOSIS — R351 Nocturia: Secondary | ICD-10-CM | POA: Diagnosis not present

## 2021-07-29 DIAGNOSIS — N401 Enlarged prostate with lower urinary tract symptoms: Secondary | ICD-10-CM | POA: Diagnosis not present

## 2021-07-30 ENCOUNTER — Other Ambulatory Visit: Payer: Self-pay

## 2021-07-30 ENCOUNTER — Ambulatory Visit (INDEPENDENT_AMBULATORY_CARE_PROVIDER_SITE_OTHER): Payer: Medicare PPO

## 2021-07-30 ENCOUNTER — Encounter: Payer: Self-pay | Admitting: Internal Medicine

## 2021-07-30 ENCOUNTER — Ambulatory Visit: Payer: Medicare PPO | Admitting: Internal Medicine

## 2021-07-30 VITALS — BP 118/60 | HR 65 | Temp 98.1°F | Ht 67.0 in | Wt 170.0 lb

## 2021-07-30 DIAGNOSIS — I2510 Atherosclerotic heart disease of native coronary artery without angina pectoris: Secondary | ICD-10-CM

## 2021-07-30 DIAGNOSIS — I10 Essential (primary) hypertension: Secondary | ICD-10-CM

## 2021-07-30 DIAGNOSIS — I2583 Coronary atherosclerosis due to lipid rich plaque: Secondary | ICD-10-CM

## 2021-07-30 DIAGNOSIS — R739 Hyperglycemia, unspecified: Secondary | ICD-10-CM | POA: Diagnosis not present

## 2021-07-30 DIAGNOSIS — F09 Unspecified mental disorder due to known physiological condition: Secondary | ICD-10-CM | POA: Diagnosis not present

## 2021-07-30 DIAGNOSIS — M2550 Pain in unspecified joint: Secondary | ICD-10-CM

## 2021-07-30 DIAGNOSIS — M353 Polymyalgia rheumatica: Secondary | ICD-10-CM

## 2021-07-30 DIAGNOSIS — N320 Bladder-neck obstruction: Secondary | ICD-10-CM

## 2021-07-30 DIAGNOSIS — Z0000 Encounter for general adult medical examination without abnormal findings: Secondary | ICD-10-CM

## 2021-07-30 DIAGNOSIS — R944 Abnormal results of kidney function studies: Secondary | ICD-10-CM

## 2021-07-30 NOTE — Assessment & Plan Note (Signed)
Cont to drink more

## 2021-07-30 NOTE — Assessment & Plan Note (Signed)
Check A1c. 

## 2021-07-30 NOTE — Progress Notes (Addendum)
Subjective:   Joshua Brown is a 76 y.o. male who presents for Medicare Annual/Subsequent preventive examination.  Review of Systems     Cardiac Risk Factors include: advanced age (>58men, >55 women);dyslipidemia;family history of premature cardiovascular disease;hypertension;male gender     Objective:    Today's Vitals   07/30/21 0850  BP: 118/60  Pulse: 65  Temp: 98.1 F (36.7 C)  SpO2: 96%  Weight: 170 lb (77.1 kg)  Height: 5\' 7"  (1.702 m)  PainSc: 0-No pain   Body mass index is 26.63 kg/m.  Advanced Directives 07/30/2021 07/08/2020 06/22/2019  Does Patient Have a Medical Advance Directive? Yes Yes Yes  Type of Advance Directive Living will;Healthcare Power of Attorney Living will;Healthcare Power of [name];Living will  Does patient want to make changes to medical advance directive? No - Patient declined No - Patient declined -  Copy of [place] in Chart? No - copy requested No - copy requested No - copy requested    Current Medications (verified) Outpatient Encounter Medications as of 07/30/2021  Medication Sig   atorvastatin (LIPITOR) 80 MG tablet TAKE 1 TABLET EVERY DAY   b complex vitamins capsule Take 1 capsule by mouth daily.     Cholecalciferol (VITAMIN D) 2000 UNIT CAPS Take 2,000 Units by mouth daily.    clopidogrel (PLAVIX) 75 MG tablet TAKE 1 TABLET EVERY DAY   ezetimibe (ZETIA) 10 MG tablet TAKE 1 TABLET EVERY DAY   glucosamine-chondroitin 500-400 MG tablet Take 1 tablet by mouth in the morning and at bedtime.   losartan (COZAAR) 25 MG tablet TAKE 1 TABLET EVERY DAY   metoprolol succinate (TOPROL-XL) 50 MG 24 hr tablet TAKE 1 TABLET BY MOUTH EVERY DAY WITH OR IMMEDIATELY AFTER A MEAL   niacin (NIASPAN) 500 MG CR tablet Take 2,000 mg by mouth at bedtime.   nitroGLYCERIN (NITROSTAT) 0.4 MG SL tablet Place 1 tablet (0.4 mg total) under the tongue every 5 (five) minutes as needed for chest pain.   pantoprazole  (PROTONIX) 40 MG tablet TAKE 1 TABLET EVERY DAY   triamcinolone cream (KENALOG) 0.1 % Apply 1 application topically as needed.   predniSONE (DELTASONE) 5 MG tablet TAKE 3 TABLETS (15 MG TOTAL) BY MOUTH DAILY WITH BREAKFAST. (Patient taking differently: Take 6 mg by mouth daily with breakfast. Weaning down)   No facility-administered encounter medications on file as of 07/30/2021.    Allergies (verified) Patient has no known allergies.   History: Past Medical History:  Diagnosis Date   ABDOMINAL AORTIC ANEURYSM 11/12/2008   has annual U/s   Atypical mole 03/08/2018   mild atypia on R lower abdomen - no treatment   CAD (coronary artery disease)    premature   DDD (degenerative disc disease)    ED (erectile dysfunction)    Frequent urination at night    nocturia 2-3   GERD (gastroesophageal reflux disease)    Hyperglycemia    Borderline   Hyperlipidemia    Atherogenic   Hypertension    Myocardial infarct, old    Obesity    Class I   PVD (peripheral vascular disease) ([place])    ABI June '12 -0.9 bilateral.   Past Surgical History:  Procedure Laterality Date   colonoscopy     one month ago   CORONARY ARTERY BYPASS GRAFT     TONSILLECTOMY     age 60   Family History  Problem Relation Age of Onset   Cancer Mother  Jaw Cancer   Hyperlipidemia Mother    Congestive Heart Failure Mother    Early death Father    Heart disease Father    CAD Father    Heart attack Father    Heart disease Paternal Grandfather    CAD Paternal Grandfather    Heart attack Paternal Grandfather    Prostate cancer Brother    Social History   Socioeconomic History   Marital status: Married    Spouse name: Not on file   Number of children: 3   Years of education: 75   Highest education level: Not on file  Occupational History   Occupation: REAL Lexicographer: SELF EMPLOYED    Comment: 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  Tobacco Use   Smoking status: Former    Packs/day: 1.00     Years: 16.00    Pack years: 16.00    Types: Cigarettes    Quit date: 07/05/1969    Years since quitting: 52.1   Smokeless tobacco: Never  Vaping Use   Vaping Use: Never used  Substance and Sexual Activity   Alcohol use: Yes    Alcohol/week: 2.0 standard drinks    Types: 2 Standard drinks or equivalent per week    Comment: socially   Drug use: No   Sexual activity: Yes    Partners: Female  Other Topics Concern   Not on file  Social History Narrative   HSG, [employer] 1 year. Married 1977. 1 son - 18; 2 daughters - 70, 70; 3  Grandchildren. 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 Marriage in good health. ACP - discussed. Provided packet May '13.   Social Determinants of Health   Financial Resource Strain: Low Risk    Difficulty of Paying Living Expenses: Not hard at all  Food Insecurity: No Food Insecurity   Worried About Charity fundraiser in the Last Year: Never true   [place] in the Last Year: Never true  Transportation Needs: No Transportation Needs   Lack of Transportation (Medical): No   Lack of Transportation (Non-Medical): No  Physical Activity: Sufficiently Active   Days of Exercise per Week: 3 days   Minutes of Exercise per Session: 70 min  Stress: No Stress Concern Present   Feeling of Stress : Not at all  Social Connections: Socially Integrated   Frequency of Communication with Friends and Family: More than three times a week   Frequency of Social Gatherings with Friends and Family: Once a week   Attends Religious Services: More than 4 times per year   Active Member of [employer] or Organizations: Yes   Attends Music therapist: More than 4 times per year   Marital Status: Married    Tobacco Counseling Counseling given: Not Answered   Clinical Intake:  Pre-visit preparation completed: Yes  Pain : No/denies pain Pain Score: 0-No pain     BMI - recorded: 26.63 Nutritional Status: BMI 25 -29 Overweight Nutritional Risks:  None Diabetes: No  How often do you need to have someone help you when you read instructions, pamphlets, or other written materials from your doctor or pharmacy?: 1 - Never What is the last grade level you completed in school?: HSG; [employer] 1 year; Business Owner  Diabetic? no  Interpreter Needed?: No  Information entered by :: Lisette Abu, LPN   Activities of Daily Living In your present state of health, do you have any difficulty performing the following activities: 07/30/2021  Hearing? Y  Comment wears hearing aids  Vision? N  Difficulty concentrating or making decisions? N  Walking or climbing stairs? N  Dressing or bathing? N  Doing errands, shopping? N  Preparing Food and eating ? N  Using the Toilet? N  In the past six months, have you accidently leaked urine? N  Do you have problems with loss of bowel control? N  Managing your Medications? N  Managing your Finances? N  Housekeeping or managing your Housekeeping? N  Some recent data might be hidden    Patient Care Team: Plotnikov, Evie Lacks, MD as PCP - General (Internal Medicine) Sherren Mocha, MD as PCP - Cardiology (Cardiology) Richmond Campbell, MD as Consulting Physician (Gastroenterology) Sharyne Peach, MD as Consulting Physician (Ophthalmology) Carolan Clines, MD (Inactive) as Consulting Physician (Urology) Rigoberto Noel, MD as Consulting Physician (Pulmonary Disease) Lavonna Monarch, MD as Consulting Physician (Dermatology) Hennie Duos, MD as Consulting Physician (Rheumatology) Raynelle Bring, MD as Consulting Physician (Urology)  Indicate any recent Medical Services you may have received from other than [facility] providers in the past year (date may be approximate).     Assessment:   This is a routine wellness examination for [place].  Hearing/Vision screen Hearing Screening - Comments:: Patient wears hearing aids. Vision Screening - Comments:: Patient wears corrective  glasses/contacts.  Eye exam done annually by: Sharyne Peach, MD.  Dietary issues and exercise activities discussed: Current Exercise Habits: Structured exercise class, Type of exercise: walking;treadmill;stretching;strength training/weights, Time (Minutes): 60, Frequency (Times/Week): 3, Weekly Exercise (Minutes/Week): 180, Intensity: Moderate, Exercise limited by: None identified   Goals Addressed               This Visit's Progress     Patient Stated (pt-stated)        My goal is to cut back on my coffee/caffeine and increase my water intake.      Depression Screen PHQ 2/9 Scores 07/30/2021 08/27/2020 07/08/2020 06/22/2019 01/18/2018 12/10/2016 12/08/2015  PHQ - 2 Score 0 0 0 0 0 0 0    Fall Risk Fall Risk  07/30/2021 08/27/2020 07/08/2020 06/22/2019 05/25/2019  Falls in the past year? 0 0 0 0 0  Comment - - - - Emmi Telephone Survey: data to providers prior to load  Number falls in past yr: 0 0 0 0 -  Injury with Fall? 0 0 0 0 -  Risk for fall due to : No Fall Risks No Fall Risks No Fall Risks - -  Follow up Falls evaluation completed - Falls evaluation completed - -    FALL RISK PREVENTION PERTAINING TO THE HOME:  Any stairs in or around the home? No  If so, are there any without handrails? No  Home free of loose throw rugs in walkways, pet beds, electrical cords, etc? Yes  Adequate lighting in your home to reduce risk of falls? Yes   ASSISTIVE DEVICES UTILIZED TO PREVENT FALLS:  Life alert? No  Use of a cane, walker or w/c? No  Grab bars in the bathroom? Yes  Shower chair or bench in shower? No  Elevated toilet seat or a handicapped toilet? No   TIMED UP AND GO:  Was the test performed? Yes .  Length of time to ambulate 10 feet: 6 sec.   Gait steady and fast without use of assistive device  Cognitive Function: Normal cognitive status assessed by direct observation by this Nurse Health Advisor. No abnormalities found.          Immunizations  Immunization History   Administered Date(s) Administered   Fluad Quad(high Dose 65+) 04/09/2019   Hepatitis A 05/05/2006, 11/23/2006   Influenza, High Dose Seasonal PF 04/18/2016, 04/25/2017, 04/10/2018, 05/13/2020, 04/23/2021   Moderna Sars-Covid-2 Vaccination 10/11/2020   PFIZER(Purple Top)SARS-COV-2 Vaccination 07/24/2019, 08/14/2019, 02/28/2020   Pfizer Covid-19 Vaccine Bivalent Booster 57yrs & up 03/25/2021   Pneumococcal Conjugate-13 11/22/2013   Pneumococcal Polysaccharide-23 11/12/2011, 08/27/2020   Td 05/05/2006, 12/17/2013   Tdap 05/05/2006, 10/04/2019   Zoster, Live 01/02/2014    TDAP status: Up to date  Flu Vaccine status: Up to date  Pneumococcal vaccine status: Up to date  Covid-19 vaccine status: Completed vaccines  Qualifies for Shingles Vaccine? Yes   Zostavax completed Yes   Shingrix Completed?: No.    Education has been provided regarding the importance of this vaccine. Patient has been advised to call insurance company to determine out of pocket expense if they have not yet received this vaccine. Advised may also receive vaccine at local pharmacy or Health Dept. Verbalized acceptance and understanding.  Screening Tests Health Maintenance  Topic Date Due   Zoster Vaccines- Shingrix (1 of 2) Never done   COLONOSCOPY (Pts 45-50yrs Insurance coverage will need to be confirmed)  02/22/2023   TETANUS/TDAP  10/03/2029   Pneumonia Vaccine 32+ Years old  Completed   INFLUENZA VACCINE  Completed   COVID-19 Vaccine  Completed   Hepatitis C Screening  Completed   HPV VACCINES  Aged Out    Health Maintenance  Health Maintenance Due  Topic Date Due   Zoster Vaccines- Shingrix (1 of 2) Never done    Colorectal cancer screening: Type of screening: Colonoscopy. Completed 02/21/2018. Repeat every 5 years  Lung Cancer Screening: (Low Dose CT Chest recommended if Age 34-80 years, 30 pack-year currently smoking OR have quit w/in 15years.) does not qualify.   Lung Cancer Screening Referral:  no  Additional Screening:  Hepatitis C Screening: does qualify; Completed yes  Vision Screening: Recommended annual ophthalmology exams for early detection of glaucoma and other disorders of the eye. Is the patient up to date with their annual eye exam?  Yes  Who is the provider or what is the name of the office in which the patient attends annual eye exams? Sharyne Peach, MD If pt is not established with a provider, would they like to be referred to a provider to establish care? No .   Dental Screening: Recommended annual dental exams for proper oral hygiene  Community Resource Referral / Chronic Care Management: CRR required this visit?  No   CCM required this visit?  No      Plan:     I have personally reviewed and noted the following in the patients chart:   Medical and social history Use of alcohol, tobacco or illicit drugs  Current medications and supplements including opioid prescriptions. Patient is not currently taking opioid prescriptions. Functional ability and status Nutritional status Physical activity Advanced directives List of other physicians Hospitalizations, surgeries, and ER visits in previous 12 months Vitals Screenings to include cognitive, depression, and falls Referrals and appointments  In addition, I have reviewed and discussed with patient certain preventive protocols, quality metrics, and best practice recommendations. A written personalized care plan for preventive services as well as general preventive health recommendations were provided to patient.     Sheral Flow, LPN   [id number]/[id number]   Nurse Notes:  Hearing Screening - Comments:: Patient wears hearing aids. Vision Screening - Comments:: Patient wears corrective glasses/contacts.  Eye  exam done annually by: Sharyne Peach, MD.   Medical screening examination/treatment/procedure(s) were performed by non-physician practitioner and as supervising physician I was immediately available  for consultation/collaboration.  I agree with above. Lew Dawes, MD

## 2021-07-30 NOTE — Assessment & Plan Note (Signed)
Off Steroids now

## 2021-07-30 NOTE — Assessment & Plan Note (Signed)
Discussed Reduce Lipitor to QOD  if OK w/Dr Burt Knack

## 2021-07-30 NOTE — Patient Instructions (Signed)
Try Tylenol PM to help arthritis and the bladder

## 2021-07-30 NOTE — Progress Notes (Signed)
Subjective:  Patient ID: Joshua Brown, male    DOB: Apr 04, 1946  Age: 76 y.o. MRN: [id number]  CC: Follow-up (6 month f/u)   HPI Joshua Brown presents for CAD, dyslipidemia, HTN f/u C/o memory issues - minor C/o arthralgias Leny was suggested Benadryl at hs by Urology  Outpatient Medications Prior to Visit  Medication Sig Dispense Refill   atorvastatin (LIPITOR) 80 MG tablet TAKE 1 TABLET EVERY DAY 90 tablet 0   b complex vitamins capsule Take 1 capsule by mouth daily.       Cholecalciferol (VITAMIN D) 2000 UNIT CAPS Take 2,000 Units by mouth daily.      clopidogrel (PLAVIX) 75 MG tablet TAKE 1 TABLET EVERY DAY 90 tablet 0   ezetimibe (ZETIA) 10 MG tablet TAKE 1 TABLET EVERY DAY 90 tablet 0   glucosamine-chondroitin 500-400 MG tablet Take 1 tablet by mouth in the morning and at bedtime.     losartan (COZAAR) 25 MG tablet TAKE 1 TABLET EVERY DAY 90 tablet 0   metoprolol succinate (TOPROL-XL) 50 MG 24 hr tablet TAKE 1 TABLET BY MOUTH EVERY DAY WITH OR IMMEDIATELY AFTER A MEAL 90 tablet 0   niacin (NIASPAN) 500 MG CR tablet Take 2,000 mg by mouth at bedtime.     nitroGLYCERIN (NITROSTAT) 0.4 MG SL tablet Place 1 tablet (0.4 mg total) under the tongue every 5 (five) minutes as needed for chest pain. 25 tablet 3   pantoprazole (PROTONIX) 40 MG tablet TAKE 1 TABLET EVERY DAY 90 tablet 0   triamcinolone cream (KENALOG) 0.1 % Apply 1 application topically as needed.     predniSONE (DELTASONE) 5 MG tablet TAKE 3 TABLETS (15 MG TOTAL) BY MOUTH DAILY WITH BREAKFAST. (Patient taking differently: Take 6 mg by mouth daily with breakfast. Weaning down) 90 tablet 0   No facility-administered medications prior to visit.    ROS: Review of Systems  Constitutional:  Negative for appetite change, fatigue and unexpected weight change.  HENT:  Negative for congestion, nosebleeds, sneezing, sore throat and trouble swallowing.   Eyes:  Negative for itching and visual disturbance.  Respiratory:   Negative for cough.   Cardiovascular:  Negative for chest pain, palpitations and leg swelling.  Gastrointestinal:  Negative for abdominal distention, blood in stool, diarrhea and nausea.  Genitourinary:  Negative for frequency and hematuria.  Musculoskeletal:  Positive for arthralgias. Negative for back pain, gait problem, joint swelling and neck pain.  Skin:  Negative for rash.  Neurological:  Negative for dizziness, tremors, speech difficulty and weakness.  Psychiatric/Behavioral:  Negative for agitation, dysphoric mood and sleep disturbance. The patient is not nervous/anxious.    Objective:  BP 118/60 (BP Location: Left Arm)    Pulse 65    Temp 98.1 F (36.7 C) (Oral)    Ht 5\' 7"  (1.702 m)    Wt 170 lb (77.1 kg)    SpO2 96%    BMI 26.63 kg/m   BP Readings from Last 3 Encounters:  07/30/21 118/60  07/30/21 118/60  02/24/21 120/62    Wt Readings from Last 3 Encounters:  07/30/21 170 lb (77.1 kg)  07/30/21 170 lb (77.1 kg)  02/24/21 166 lb (75.3 kg)    Physical Exam Constitutional:      General: He is not in acute distress.    Appearance: He is well-developed.     Comments: NAD  Eyes:     Conjunctiva/sclera: Conjunctivae normal.     Pupils: Pupils are equal, round, and reactive to  light.  Neck:     Thyroid: No thyromegaly.     Vascular: No JVD.  Cardiovascular:     Rate and Rhythm: Normal rate and regular rhythm.     Heart sounds: Normal heart sounds. No murmur heard.   No friction rub. No gallop.  Pulmonary:     Effort: Pulmonary effort is normal. No respiratory distress.     Breath sounds: Normal breath sounds. No wheezing or rales.  Chest:     Chest wall: No tenderness.  Abdominal:     General: Bowel sounds are normal. There is no distension.     Palpations: Abdomen is soft. There is no mass.     Tenderness: There is no abdominal tenderness. There is no guarding or rebound.  Musculoskeletal:        General: No tenderness. Normal range of motion.     Cervical  back: Normal range of motion.  Lymphadenopathy:     Cervical: No cervical adenopathy.  Skin:    General: Skin is warm and dry.     Findings: No rash.  Neurological:     Mental Status: He is alert and oriented to person, place, and time.     Cranial Nerves: No cranial nerve deficit.     Motor: No abnormal muscle tone.     Coordination: Coordination normal.     Gait: Gait normal.     Deep Tendon Reflexes: Reflexes are normal and symmetric.  Psychiatric:        Behavior: Behavior normal.        Thought Content: Thought content normal.        Judgment: Judgment normal.  A/o x3  Lab Results  Component Value Date   WBC 8.8 09/01/2020   HGB 15.0 09/01/2020   HCT 45.0 09/01/2020   PLT 154.0 09/01/2020   GLUCOSE 97 02/24/2021   CHOL 137 09/01/2020   TRIG 72.0 09/01/2020   HDL 54.40 09/01/2020   LDLCALC 68 09/01/2020   ALT 21 02/24/2021   AST 24 02/24/2021   NA 139 02/24/2021   K 4.9 02/24/2021   CL 102 02/24/2021   CREATININE 1.21 02/24/2021   BUN 20 02/24/2021   CO2 31 02/24/2021   TSH 1.12 09/01/2020   PSA 0.53 09/01/2020   INR 1.1 RATIO (H) 05/16/2008   HGBA1C 7.0 (H) 02/24/2021    VAS [place] ABI WITH/WO TBI  Result Date: 05/22/2021  LOWER EXTREMITY DOPPLER STUDY Patient Name:  Joshua Brown  Date of Exam:   05/21/2021 Medical Rec #: [id number]      Accession #:    [id number] Date of Birth: May 28, 1946      Patient Gender: M Patient Age:   72 years Exam Location:  [facility] Procedure:      VAS [place] ABI WITH/WO TBI Referring Phys: MICHAEL COOPER --------------------------------------------------------------------------------  Indications: Peripheral artery disease, and and patient denies any claudication              symptoms or rest pain. High Risk Factors: Hypertension, hyperlipidemia, past history of smoking, prior                    MI, coronary artery disease.  Comparison Study: In 05/2020, a lower arterial Doppler showed an ABI of .95 on                   the right and .85 on the  left. Performing Technologist: Sharlett Iles RVT  Examination Guidelines: A complete evaluation includes at minimum, Doppler  waveform signals and systolic blood pressure reading at the level of bilateral brachial, anterior tibial, and posterior tibial arteries, when vessel segments are accessible. Bilateral testing is considered an integral part of a complete examination. Photoelectric Plethysmograph (PPG) waveforms and toe systolic pressure readings are included as required and additional duplex testing as needed. Limited examinations for reoccurring indications may be performed as noted.  ABI Findings: +---------+------------------+-----+-----------+--------+  Right     Rt Pressure (mmHg) Index Waveform    Comment   +---------+------------------+-----+-----------+--------+  Brachial  143                                            +---------+------------------+-----+-----------+--------+  PTA       126                0.88  triphasic             +---------+------------------+-----+-----------+--------+  PERO      120                      multiphasic .84       +---------+------------------+-----+-----------+--------+  DP        118                0.83  triphasic             +---------+------------------+-----+-----------+--------+  Great Toe 103                0.72                        +---------+------------------+-----+-----------+--------+ +---------+------------------+-----+-----------+-------+  Left      Lt Pressure (mmHg) Index Waveform    Comment  +---------+------------------+-----+-----------+-------+  Brachial  143                                           +---------+------------------+-----+-----------+-------+  PTA       128                0.90  triphasic            +---------+------------------+-----+-----------+-------+  PERO      119                      multiphasic .83      +---------+------------------+-----+-----------+-------+  DP        114                0.80  multiphasic           +---------+------------------+-----+-----------+-------+  Great Toe 113                0.79                       +---------+------------------+-----+-----------+-------+ +-------+-----------+-----------+------------+------------+  ABI/TBI Today's ABI Today's TBI Previous ABI Previous TBI  +-------+-----------+-----------+------------+------------+  Right   .88         .72         .95          .59           +-------+-----------+-----------+------------+------------+  Left    .90         .79         .  85          .55           +-------+-----------+-----------+------------+------------+ Right ABIs appear decreased compared to prior study on 05/21/2020. Left ABIs appear essentially unchanged compared to prior study on 05/21/2020. Bilateral TBIs appear increased compared to prior study on 05/21/2020.  Summary: Right: Resting right ankle-brachial index indicates mild right lower extremity arterial disease. The right toe-brachial index is normal. Left: Resting left ankle-brachial index indicates mild left lower extremity arterial disease. The left toe-brachial index is normal.  *See table(s) above for measurements and observations. See Aortoiliac duplex report.  Suggest follow up study in 12 months. Electronically signed by Carlyle Dolly MD on 05/22/2021 at 4:03:20 PM.    Final    VAS US AORTA/IVC/ILIACS  Result Date: 05/22/2021 ABDOMINAL AORTA STUDY Patient Name:  ESTIL VALLEE  Date of Exam:   05/21/2021 Medical Rec #: [id number]      Accession #:    [id number] Date of Birth: [birth year]      Patient Gender: M Patient Age:   30 years Exam Location:  [facility] Procedure:      VAS US AORTA/IVC/ILIACS Referring Phys: Sherren Mocha --------------------------------------------------------------------------------  Indications: Peripheral arterial disease, and patient denies any claudication              symptoms or rest pain.               Today's ABIs are .88 on the right and .90 on the left. Risk Factors: Hypertension,  hyperlipidemia, past history of smoking, prior MI,               coronary artery disease. Limitations: Air/bowel gas.  Comparison Study: In 05/2020, a lower arterial duplex performed at                   [facility] showed velocities of                   309 cm/s in the distal aorta and 294 cm/s in the right                   proximal common iliac artery, both suggesting >50% stenosis.                   Highest velocities in the left common iliac artery was in the                   proximal segment at 171 cm/s, not consistent with prior exam                   of 347 cm/s on 05/21/2019. Performing Technologist: Sharlett Iles RVT  Examination Guidelines: A complete evaluation includes B-mode imaging, spectral Doppler, color Doppler, and power Doppler as needed of all accessible portions of each vessel. Bilateral testing is considered an integral part of a complete examination. Limited examinations for reoccurring indications may be performed as noted.  Abdominal Aorta Findings: +-------------+-------+----------+------+----------------+--------+------------+  Location      AP (cm) Trans (cm) PSV    Waveform         Thrombus Comments                                        (cm/s)                                         +-------------+-------+----------+------+----------------+--------+------------+  Proximal      2.00    2.00       107    triphasic                 dampened      +-------------+-------+----------+------+----------------+--------+------------+  Mid                              147    triphasic                 dampened      +-------------+-------+----------+------+----------------+--------+------------+  Distal                           360    biphasic                  turbulent     +-------------+-------+----------+------+----------------+--------+------------+  RT CIA Prox                      369    triphasic                 turbulent      +-------------+-------+----------+------+----------------+--------+------------+  RT CIA Mid                       220    triphasic                 turbulent     +-------------+-------+----------+------+----------------+--------+------------+  RT CIA Distal                    137    triphasic                               +-------------+-------+----------+------+----------------+--------+------------+  RT EIA Prox                      125    triphasic                               +-------------+-------+----------+------+----------------+--------+------------+  RT EIA Mid                       132    triphasic                               +-------------+-------+----------+------+----------------+--------+------------+  RT EIA Distal                    104    barely triphasic                        +-------------+-------+----------+------+----------------+--------+------------+  LT CIA Prox                      521    triphasic                 turbulent,  increase in                                                                      velocities                                                                       when                                                                             compared to                                                                      exam on                                                                          05/21/2019    +-------------+-------+----------+------+----------------+--------+------------+  LT CIA Mid                       234    triphasic                               +-------------+-------+----------+------+----------------+--------+------------+  LT CIA Distal                    195    triphasic                               +-------------+-------+----------+------+----------------+--------+------------+  LT EIA Prox                      164    multiphasic                              +-------------+-------+----------+------+----------------+--------+------------+  LT EIA Mid                       102    barely triphasic                        +-------------+-------+----------+------+----------------+--------+------------+  LT EIA Distal                    119    barely triphasic                        +-------------+-------+----------+------+----------------+--------+------------+ IVC/Iliac Findings: +--------+------+--------+--------+    IVC    Patent Thrombus Comments  +--------+------+--------+--------+  IVC Prox patent                    +--------+------+--------+--------+    Summary: Abdominal Aorta: Aortoiliac atherosclerosis. The largest aortic measurement is 2.0 cm. Stenosis: +--------------------+-------------+-------------------------------------------+  Location             Stenosis      Comments                                     +--------------------+-------------+-------------------------------------------+  Distal Aorta         >50% stenosis stable velocities                            +--------------------+-------------+-------------------------------------------+  Right Common Iliac   >50% stenosis stable velocities                            +--------------------+-------------+-------------------------------------------+  Left Common Iliac    >50% stenosis increase velocities at the ostium compared                                       to prior exam in 05/2019                     +--------------------+-------------+-------------------------------------------+  Right External Iliac <50% stenosis                                              +--------------------+-------------+-------------------------------------------+  Left External Iliac  <50% stenosis                                              +--------------------+-------------+-------------------------------------------+  IVC/Iliac: Patent IVC.  *See table(s) above for measurements and observations. See ABI report.  Suggest follow up study in 12 months.  Electronically signed by Carlyle Dolly MD on 05/22/2021 at 4:23:43 PM.    Final     Assessment & Plan:   Problem List Items Addressed This Visit     Arthralgia    Try Tylenol PM at night, Tylenol prn      Bladder neck obstruction    He will try Benadryl ot Tylenol PM to help arthritis and the bladder      CAD (coronary artery disease)    Cont on Lipitor, Plavix, Losartan, Toprol, Zetia      Decreased GFR    Cont to drink more      Essential hypertension    Cont on Losartan, Toprol      Hyperglycemia    Check A1c      Mild cognitive disorder    Discussed Reduce Lipitor to QOD  if  OK w/Dr Burt Knack      PMR (polymyalgia rheumatica) ([place])    Off Steroids now         No orders of the defined types were placed in this encounter.     Follow-up: Return in about 6 months (around 01/27/2022) for Wellness Exam.  Walker Kehr, MD

## 2021-07-30 NOTE — Assessment & Plan Note (Addendum)
He will try Benadryl ot Tylenol PM to help arthritis and the bladder

## 2021-07-30 NOTE — Assessment & Plan Note (Signed)
Try Tylenol PM at night, Tylenol prn

## 2021-07-30 NOTE — Assessment & Plan Note (Signed)
Cont on Lipitor, Plavix, Losartan, Toprol, Zetia

## 2021-07-30 NOTE — Assessment & Plan Note (Signed)
Cont on Losartan, Toprol

## 2021-08-05 ENCOUNTER — Other Ambulatory Visit: Payer: Self-pay

## 2021-08-05 ENCOUNTER — Ambulatory Visit: Payer: Medicare PPO | Admitting: Cardiovascular Disease

## 2021-08-05 ENCOUNTER — Encounter: Payer: Self-pay | Admitting: Cardiovascular Disease

## 2021-08-05 VITALS — BP 124/80 | HR 60 | Ht 66.5 in | Wt 171.0 lb

## 2021-08-05 DIAGNOSIS — E782 Mixed hyperlipidemia: Secondary | ICD-10-CM | POA: Diagnosis not present

## 2021-08-05 DIAGNOSIS — R011 Cardiac murmur, unspecified: Secondary | ICD-10-CM | POA: Diagnosis not present

## 2021-08-05 DIAGNOSIS — I739 Peripheral vascular disease, unspecified: Secondary | ICD-10-CM | POA: Diagnosis not present

## 2021-08-05 DIAGNOSIS — I2510 Atherosclerotic heart disease of native coronary artery without angina pectoris: Secondary | ICD-10-CM | POA: Diagnosis not present

## 2021-08-05 DIAGNOSIS — N5201 Erectile dysfunction due to arterial insufficiency: Secondary | ICD-10-CM

## 2021-08-05 DIAGNOSIS — I10 Essential (primary) hypertension: Secondary | ICD-10-CM

## 2021-08-05 MED ORDER — SILDENAFIL CITRATE 20 MG PO TABS: ORAL_TABLET | ORAL | 1 refills | Status: DC

## 2021-08-05 NOTE — Progress Notes (Signed)
Cardiology Office Note:    Date:  08/05/2021   ID:  Berthold, Glace 1946/02/25, MRN [id number]  PCP:  Cassandria Anger, MD   [facility] Providers Cardiologist:  Sherren Mocha, MD     Referring MD: Cassandria Anger, MD   Chief Complaint  Patient presents with   Coronary Artery Disease    History of Present Illness:    Joshua Brown is a 76 y.o. male with a hx of coronary artery disease with remote MI at age 49.  The patient underwent multivessel CABG at age 62.  He is followed for peripheral arterial disease with intermittent claudication secondary to distal aortic and bilateral iliac stenoses.  He undergoes annual aortoiliac duplex studies and ABIs which have been stable over time.  He has not had any stress testing in some time because of good functional capacity with no cardiac symptoms.  The patient is here alone today. He reports that since his last visit he was diagnosed with polymyalgia rheumatica and has now tapered off of prednisone. We discussed his lower extremity doppler studies and ABI's today. He's going to the gym regularly and having no symptoms at this time. He specifically denies leg pain or fatigue with working out. He walks up steps and has no issues with that. He also does 2 miles on the elliptical and 3 miles on the stationary bike on a regular basis. He denies chest pressure or DOE but has some sharp chest pains at times. No other cardiac-related complaints at this time.   Past Medical History:  Diagnosis Date   ABDOMINAL AORTIC ANEURYSM 11/12/2008   has annual U/s   Atypical mole 03/08/2018   mild atypia on R lower abdomen - no treatment   CAD (coronary artery disease)    premature   DDD (degenerative disc disease)    ED (erectile dysfunction)    Frequent urination at night    nocturia 2-3   GERD (gastroesophageal reflux disease)    Hyperglycemia    Borderline   Hyperlipidemia    Atherogenic   Hypertension    Myocardial infarct, old     Obesity    Class I   PVD (peripheral vascular disease) ([place])    ABI June '12 -0.9 bilateral.    Past Surgical History:  Procedure Laterality Date   colonoscopy     one month ago   CORONARY ARTERY BYPASS GRAFT     TONSILLECTOMY     age 60    Current Medications: Current Meds  Medication Sig   atorvastatin (LIPITOR) 80 MG tablet TAKE 1 TABLET EVERY DAY   b complex vitamins capsule Take 1 capsule by mouth daily.     Cholecalciferol (VITAMIN D) 2000 UNIT CAPS Take 2,000 Units by mouth daily.    clopidogrel (PLAVIX) 75 MG tablet TAKE 1 TABLET EVERY DAY   ezetimibe (ZETIA) 10 MG tablet TAKE 1 TABLET EVERY DAY   glucosamine-chondroitin 500-400 MG tablet Take 1 tablet by mouth in the morning and at bedtime.   losartan (COZAAR) 25 MG tablet TAKE 1 TABLET EVERY DAY   metoprolol succinate (TOPROL-XL) 50 MG 24 hr tablet TAKE 1 TABLET BY MOUTH EVERY DAY WITH OR IMMEDIATELY AFTER A MEAL   naproxen (NAPROSYN) 500 MG tablet 1 tablet with food or milk as needed   niacin (NIASPAN) 500 MG CR tablet Take 2,000 mg by mouth at bedtime.   nitroGLYCERIN (NITROSTAT) 0.4 MG SL tablet Place 1 tablet (0.4 mg total) under the tongue every  5 (five) minutes as needed for chest pain.   pantoprazole (PROTONIX) 40 MG tablet TAKE 1 TABLET EVERY DAY   sildenafil (REVATIO) 20 MG tablet Take 2-5 tablets by mouth as needed prior to intercourse   triamcinolone cream (KENALOG) 0.1 % Apply 1 application topically as needed.   Vibegron (GEMTESA) 75 MG TABS Take by mouth. Per patient "I will be taking". Haven't started taking medication     Allergies:   Patient has no known allergies.   Social History   Socioeconomic History   Marital status: Married    Spouse name: Not on file   Number of children: 3   Years of education: 17   Highest education level: Not on file  Occupational History   Occupation: REAL ESTATE    Employer: [employer]: 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  Tobacco Use   Smoking  status: Former    Packs/day: 1.00    Years: 16.00    Pack years: 16.00    Types: Cigarettes    Quit date: 07/05/1969    Years since quitting: 52.1   Smokeless tobacco: Never  Vaping Use   Vaping Use: Never used  Substance and Sexual Activity   Alcohol use: Yes    Alcohol/week: 2.0 standard drinks    Types: 2 Standard drinks or equivalent per week    Comment: socially   Drug use: No   Sexual activity: Yes    Partners: Female  Other Topics Concern   Not on file  Social History Narrative   HSG, [employer] 1 year. Married 1977. 1 son - 74; 2 daughters - 26, 76; 3  Grandchildren. 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 Marriage in good health. ACP - discussed. Provided packet May '13.   Social Determinants of Health   Financial Resource Strain: Low Risk    Difficulty of Paying Living Expenses: Not hard at all  Food Insecurity: No Food Insecurity   Worried About Charity fundraiser in the Last Year: Never true   [place] in the Last Year: Never true  Transportation Needs: No Transportation Needs   Lack of Transportation (Medical): No   Lack of Transportation (Non-Medical): No  Physical Activity: Sufficiently Active   Days of Exercise per Week: 3 days   Minutes of Exercise per Session: 70 min  Stress: No Stress Concern Present   Feeling of Stress : Not at all  Social Connections: Socially Integrated   Frequency of Communication with Friends and Family: More than three times a week   Frequency of Social Gatherings with Friends and Family: Once a week   Attends Religious Services: More than 4 times per year   Active Member of [employer] or Organizations: Yes   Attends Music therapist: More than 4 times per year   Marital Status: Married     Family History: The patient's family history includes CAD in his father and paternal grandfather; Cancer in his mother; Congestive Heart Failure in his mother; Early death in his father; Heart attack in his father and  paternal grandfather; Heart disease in his father and paternal grandfather; Hyperlipidemia in his mother; Prostate cancer in his brother.  ROS:   Please see the history of present illness.    All other systems reviewed and are negative.  EKGs/Labs/Other Studies Reviewed:    The following studies were reviewed today: Vascular US (LEA):   Summary:  Abdominal Aorta: Aortoiliac atherosclerosis. The largest aortic  measurement is  2.0 cm.   Stenosis:  +--------------------+-------------+---------------------------------------  ----+   Location             Stenosis      Comments                                       +--------------------+-------------+---------------------------------------  ----+   Distal Aorta         >50% stenosis stable velocities                               +--------------------+-------------+---------------------------------------  ----+   Right Common Iliac   >50% stenosis stable velocities                               +--------------------+-------------+---------------------------------------  ----+   Left Common Iliac    >50% stenosis increase velocities at the ostium  compared                                        to prior exam in 05/2019                       +--------------------+-------------+---------------------------------------  ----+   Right External Iliac <50% stenosis                                                +--------------------+-------------+---------------------------------------  ----+   Left External Iliac  <50% stenosis                                                +--------------------+-------------+---------------------------------------  ----+   ABI's: Right ABIs appear decreased compared to prior study on 05/21/2020. Left  ABIs appear essentially unchanged compared to prior study on 05/21/2020.  Bilateral TBIs appear increased compared to prior study on 05/21/2020.     Summary:  Right: Resting right ankle-brachial  index indicates mild right lower  extremity arterial disease. The right toe-brachial index is normal.   Left: Resting left ankle-brachial index indicates mild left lower  extremity arterial disease. The left toe-brachial index is normal.   Cardiac cath 2009: CONCLUSION:  1. Coronary artery disease status post coronary artery bypass graft      surgery in 1987.  2. Severe native vessel disease with total occlusion of the left main      and total occlusion of the right coronary artery with bridging      collaterals filling the mid-right coronary artery and subsequent      filling of the posterior descending branch through the distal limb      of the saphenous vein graft to its connection to the right      ventricular branch.  3. Patent vein graft to the marginal branch of circumflex artery,      patent LIMA graft to LAD, and occluded vein graft to the right      coronary.  4. Inferobasal wall hypokinesis and overall with an ejection fraction  of 60%.  EKG:  EKG is ordered today.  The ekg ordered today demonstrates NSR 60 bpm, ST-T wave abnormality consider inferior ischemia no change from prior  Recent Labs: 09/01/2020: Hemoglobin 15.0; Platelets 154.0; TSH 1.12 02/24/2021: ALT 21; BUN 20; Creatinine, Ser 1.21; Potassium 4.9; Sodium 139  Recent Lipid Panel    Component Value Date/Time   CHOL 137 09/01/2020 0809   CHOL 120 06/02/2020 0740   TRIG 72.0 09/01/2020 0809   HDL 54.40 09/01/2020 0809   HDL 41 06/02/2020 0740   CHOLHDL 3 09/01/2020 0809   VLDL 14.4 09/01/2020 0809   LDLCALC 68 09/01/2020 0809   LDLCALC 67 06/02/2020 0740     Risk Assessment/Calculations:           Physical Exam:    VS:  BP 124/80    Pulse 60    Ht 5' 6.5" (1.689 m)    Wt 171 lb (77.6 kg)    SpO2 97%    BMI 27.19 kg/m     Wt Readings from Last 3 Encounters:  08/05/21 171 lb (77.6 kg)  07/30/21 170 lb (77.1 kg)  07/30/21 170 lb (77.1 kg)     GEN:  Well nourished, well developed in no  acute distress HEENT: Normal NECK: No JVD; No carotid bruits LYMPHATICS: No lymphadenopathy CARDIAC: RRR, 2/6 systolic murmur LLSB RESPIRATORY:  Clear to auscultation without rales, wheezing or rhonchi  ABDOMEN: Soft, non-tender, non-distended MUSCULOSKELETAL:  No edema; No deformity  SKIN: Warm and dry NEUROLOGIC:  Alert and oriented x 3 PSYCHIATRIC:  Normal affect   ASSESSMENT:    1. Coronary artery disease involving native coronary artery of native heart without angina pectoris   2. Essential hypertension   3. PAD (peripheral artery disease) ([place])   4. Mixed hyperlipidemia   5. Heart murmur   6. Erectile dysfunction due to arterial insufficiency    PLAN:    In order of problems listed above:  Some atypical symptoms. With his extensive CAD and last stress test 10 years ago, I have recommended a stress Myoview scan for risk stratification. Continue current Rx.  BP well-controlled on losartan and metoprolol.  Reivdewed LEA and ABI studies. No symptoms at present. Repeat vascular studies in one year. Continue clopidogrel, statin, ARB.  LDL 68, ALT 21. Treated with atorvastatin and zetia. He had some concerns about statins and dementia risk. He is reassured in this regard and understands the importance of CV risk reduction. Sounds benign/outflow. Check 2D echo to confirm. Trial of sildenafil. Discussed contraindication with concomitant NTG. He has NTG Rx but never uses.       Shared Decision Making/Informed Consent The risks [chest pain, shortness of breath, cardiac arrhythmias, dizziness, blood pressure fluctuations, myocardial infarction, stroke/transient ischemic attack, nausea, vomiting, allergic reaction, radiation exposure, metallic taste sensation and life-threatening complications (estimated to be 1 in 10,000)], benefits (risk stratification, diagnosing coronary artery disease, treatment guidance) and alternatives of a nuclear stress test were discussed in detail with Mr.  Xu and he agrees to proceed.    Medication Adjustments/Labs and Tests Ordered: Current medicines are reviewed at length with the patient today.  Concerns regarding medicines are outlined above.  Orders Placed This Encounter  Procedures   Cardiac Stress Test: Informed Consent Details: Physician/Practitioner Attestation; Transcribe to consent form and obtain patient signature   MYOCARDIAL PERFUSION IMAGING   EKG 12-Lead   ECHOCARDIOGRAM COMPLETE   Meds ordered this encounter  Medications   sildenafil (REVATIO) 20 MG tablet    Sig:  Take 2-5 tablets by mouth as needed prior to intercourse    Dispense:  90 tablet    Refill:  1    Patient Instructions  Medication Instructions:  START SILDENAFIL as needed *If you need a refill on your cardiac medications before your next appointment, please call your pharmacy*   Lab Work: NONE If you have labs (blood work) drawn today and your tests are completely normal, you will receive your results only by: [place] (if you have MyChart) OR A paper copy in the mail If you have any lab test that is abnormal or we need to change your treatment, we will call you to review the results.   Testing/Procedures: ECHO Your physician has requested that you have an echocardiogram. Echocardiography is a painless test that uses sound waves to create images of your heart. It provides your doctor with information about the size and shape of your heart and how well your hearts chambers and valves are working. This procedure takes approximately one hour. There are no restrictions for this procedure.  Exercise Myoview Stress test Your physician has requested that you have en exercise stress myoview. For further information please visit [email or web address]. Please follow instruction sheet, as given.    Follow-Up: At [facility], you and your health needs are our priority.  As part of our continuing mission to provide you with exceptional heart care,  we have created designated Provider Care Teams.  These Care Teams include your primary Cardiologist (physician) and Advanced Practice Providers (APPs -  Physician Assistants and Nurse Practitioners) who all work together to provide you with the care you need, when you need it.   Your next appointment:   1 year(s)  The format for your next appointment:   In Person  Provider:   Sherren Mocha, MD     Other Instructions Instructions in separate letter for Stress test   Signed, Sherren Mocha, MD  08/05/2021 5:20 PM    [facility]

## 2021-08-05 NOTE — Patient Instructions (Addendum)
Medication Instructions:  START SILDENAFIL as needed *If you need a refill on your cardiac medications before your next appointment, please call your pharmacy*   Lab Work: NONE If you have labs (blood work) drawn today and your tests are completely normal, you will receive your results only by: [place] (if you have MyChart) OR A paper copy in the mail If you have any lab test that is abnormal or we need to change your treatment, we will call you to review the results.   Testing/Procedures: ECHO Your physician has requested that you have an echocardiogram. Echocardiography is a painless test that uses sound waves to create images of your heart. It provides your doctor with information about the size and shape of your heart and how well your hearts chambers and valves are working. This procedure takes approximately one hour. There are no restrictions for this procedure.  Exercise Myoview Stress test Your physician has requested that you have en exercise stress myoview. For further information please visit [email or web address]. Please follow instruction sheet, as given.    Follow-Up: At [facility], you and your health needs are our priority.  As part of our continuing mission to provide you with exceptional heart care, we have created designated Provider Care Teams.  These Care Teams include your primary Cardiologist (physician) and Advanced Practice Providers (APPs -  Physician Assistants and Nurse Practitioners) who all work together to provide you with the care you need, when you need it.   Your next appointment:   1 year(s)  The format for your next appointment:   In Person  Provider:   Sherren Mocha, MD     Other Instructions Instructions in separate letter for Stress test

## 2021-08-10 ENCOUNTER — Encounter: Payer: Self-pay | Admitting: Internal Medicine

## 2021-08-10 ENCOUNTER — Telehealth: Payer: Self-pay

## 2021-08-10 DIAGNOSIS — E663 Overweight: Secondary | ICD-10-CM | POA: Diagnosis not present

## 2021-08-10 DIAGNOSIS — Z7952 Long term (current) use of systemic steroids: Secondary | ICD-10-CM | POA: Diagnosis not present

## 2021-08-10 DIAGNOSIS — M150 Primary generalized (osteo)arthritis: Secondary | ICD-10-CM | POA: Diagnosis not present

## 2021-08-10 DIAGNOSIS — M353 Polymyalgia rheumatica: Secondary | ICD-10-CM | POA: Diagnosis not present

## 2021-08-10 DIAGNOSIS — Z6827 Body mass index (BMI) 27.0-27.9, adult: Secondary | ICD-10-CM | POA: Diagnosis not present

## 2021-08-10 NOTE — Telephone Encounter (Signed)
**Note De-Identified  Obfuscation** Sildenafil PA started through covermymeds. Key: [id number]

## 2021-08-11 NOTE — Telephone Encounter (Signed)
**Note De-Identified  Obfuscation** Darlyn Read Key: [id number] - PA Case ID: [id number] - Rx #: [id number] Outcome: Denied on February 6 The Medicare rule in the Prescription Drug Benefit Manual (Chapter 6, Section 20.1) says drugs used for the treatment of erectile dysfunction (ED) are excluded from Part D coverage. Your drug is prescribed for ED. Per Medicare rules, it is not covered. Drug: Sildenafil Citrate 20MG  tablets Form [employer] Electronic PA Form  I have notified Crytella at [employer] of this denial.

## 2021-08-12 ENCOUNTER — Encounter (HOSPITAL_COMMUNITY): Payer: Self-pay | Admitting: *Deleted

## 2021-08-12 ENCOUNTER — Telehealth (HOSPITAL_COMMUNITY): Payer: Self-pay | Admitting: *Deleted

## 2021-08-12 ENCOUNTER — Other Ambulatory Visit: Payer: Self-pay | Admitting: Internal Medicine

## 2021-08-12 MED ORDER — ALPRAZOLAM 0.5 MG PO TABS: 0.5000 mg | ORAL_TABLET | Freq: Every evening | ORAL | 3 refills | Status: DC | PRN

## 2021-08-12 NOTE — Telephone Encounter (Signed)
Left message on voicemail per DPR in reference to upcoming appointment scheduled on 08/19/21 at 0715 with detailed instructions given per Myocardial Perfusion Study Information Sheet for the test. LM to arrive 15 minutes early, and that it is imperative to arrive on time for appointment to keep from having the test rescheduled. If you need to cancel or reschedule your appointment, please call the office within 24 hours of your appointment. Failure to do so may result in a cancellation of your appointment, and a $50 no show fee. Phone number given for call back for any questions.  Mychart letter sent. Adryan Druckenmiller, Ranae [place]

## 2021-08-12 NOTE — Progress Notes (Signed)
Okay.  Thanks.

## 2021-08-19 ENCOUNTER — Ambulatory Visit (HOSPITAL_BASED_OUTPATIENT_CLINIC_OR_DEPARTMENT_OTHER): Payer: Medicare PPO

## 2021-08-19 ENCOUNTER — Other Ambulatory Visit: Payer: Self-pay

## 2021-08-19 ENCOUNTER — Ambulatory Visit (HOSPITAL_COMMUNITY): Payer: Medicare PPO | Attending: Internal Medicine

## 2021-08-19 DIAGNOSIS — I2510 Atherosclerotic heart disease of native coronary artery without angina pectoris: Secondary | ICD-10-CM | POA: Diagnosis not present

## 2021-08-19 DIAGNOSIS — I739 Peripheral vascular disease, unspecified: Secondary | ICD-10-CM | POA: Diagnosis not present

## 2021-08-19 DIAGNOSIS — I10 Essential (primary) hypertension: Secondary | ICD-10-CM

## 2021-08-19 DIAGNOSIS — N5201 Erectile dysfunction due to arterial insufficiency: Secondary | ICD-10-CM

## 2021-08-19 DIAGNOSIS — E782 Mixed hyperlipidemia: Secondary | ICD-10-CM

## 2021-08-19 DIAGNOSIS — R011 Cardiac murmur, unspecified: Secondary | ICD-10-CM

## 2021-08-19 LAB — MYOCARDIAL PERFUSION IMAGING
Angina Index: 0
Duke Treadmill Score: 9
Estimated workload: 10.1
Exercise duration (min): 9 min
LV dias vol: 68 mL (ref 62–150)
LV sys vol: 28 mL
MPHR: 144 {beats}/min
Nuc Stress EF: 59 %
Peak HR: 134 {beats}/min
Percent HR: 93 %
Rest HR: 73 {beats}/min
Rest Nuclear Isotope Dose: 10.2 mCi
SDS: 4
SRS: 0
SSS: 4
ST Depression (mm): 0 mm
Stress Nuclear Isotope Dose: 30.2 mCi
TID: 0.8

## 2021-08-19 LAB — ECHOCARDIOGRAM COMPLETE
Area-P 1/2: 3.14 cm2
Height: 66 in
S' Lateral: 2 cm
Weight: 2736 oz

## 2021-08-19 MED ORDER — TECHNETIUM TC 99M TETROFOSMIN IV KIT
30.2000 | PACK | Freq: Once | INTRAVENOUS | Status: AC | PRN
  Administered 2021-08-19: 30.2 via INTRAVENOUS
  Filled 2021-08-19: qty 31

## 2021-08-19 MED ORDER — TECHNETIUM TC 99M TETROFOSMIN IV KIT
10.2000 | PACK | Freq: Once | INTRAVENOUS | Status: AC | PRN
  Administered 2021-08-19: 10.2 via INTRAVENOUS
  Filled 2021-08-19: qty 11

## 2021-08-21 ENCOUNTER — Other Ambulatory Visit: Payer: Self-pay | Admitting: Cardiovascular Disease

## 2021-08-21 DIAGNOSIS — I10 Essential (primary) hypertension: Secondary | ICD-10-CM

## 2021-08-21 DIAGNOSIS — R011 Cardiac murmur, unspecified: Secondary | ICD-10-CM

## 2021-08-21 DIAGNOSIS — N5201 Erectile dysfunction due to arterial insufficiency: Secondary | ICD-10-CM

## 2021-08-21 DIAGNOSIS — I2510 Atherosclerotic heart disease of native coronary artery without angina pectoris: Secondary | ICD-10-CM

## 2021-08-21 DIAGNOSIS — I739 Peripheral vascular disease, unspecified: Secondary | ICD-10-CM

## 2021-08-21 DIAGNOSIS — E782 Mixed hyperlipidemia: Secondary | ICD-10-CM

## 2021-08-21 MED ORDER — SILDENAFIL CITRATE 20 MG PO TABS: ORAL_TABLET | ORAL | 1 refills | Status: DC

## 2021-08-21 NOTE — Progress Notes (Signed)
Pt states that original rx which was sent into [facility] pharmacy was denied coverage. He is requesting this be sent to his local pharmacy so that he can pay cash price instead. Sent to [facility] on file.

## 2021-09-20 ENCOUNTER — Other Ambulatory Visit: Payer: Self-pay | Admitting: Cardiovascular Disease

## 2021-12-07 ENCOUNTER — Ambulatory Visit: Payer: Medicare PPO | Admitting: Dermatology

## 2021-12-07 DIAGNOSIS — L570 Actinic keratosis: Secondary | ICD-10-CM | POA: Diagnosis not present

## 2021-12-07 DIAGNOSIS — D485 Neoplasm of uncertain behavior of skin: Secondary | ICD-10-CM

## 2021-12-07 DIAGNOSIS — L821 Other seborrheic keratosis: Secondary | ICD-10-CM

## 2021-12-07 DIAGNOSIS — L820 Inflamed seborrheic keratosis: Secondary | ICD-10-CM | POA: Diagnosis not present

## 2021-12-07 DIAGNOSIS — L300 Nummular dermatitis: Secondary | ICD-10-CM

## 2021-12-07 MED ORDER — CLOBETASOL PROPIONATE 0.05 % EX CREA
1.0000 | TOPICAL_CREAM | Freq: Two times a day (BID) | CUTANEOUS | 2 refills | Status: AC
Start: 2021-12-07 — End: ?

## 2021-12-07 NOTE — Patient Instructions (Signed)

## 2021-12-14 ENCOUNTER — Other Ambulatory Visit: Payer: Self-pay | Admitting: Cardiovascular Disease

## 2021-12-14 ENCOUNTER — Ambulatory Visit (INDEPENDENT_AMBULATORY_CARE_PROVIDER_SITE_OTHER): Payer: Medicare PPO

## 2021-12-14 DIAGNOSIS — Z23 Encounter for immunization: Secondary | ICD-10-CM

## 2021-12-25 ENCOUNTER — Encounter: Payer: Self-pay | Admitting: Dermatology

## 2021-12-25 NOTE — Progress Notes (Signed)
Follow-Up Visit   Subjective  Joshua Brown is a 76 y.o. male who presents for the following: Annual Exam (Here for skin exam. Concerns scalp x 2 lesions that are crusty. Patient does wear CPAP and patient is not ure if its irritated from straps. Back, and lower legs some crusty lesions. Patient did get TAC cream from last visit for his scaly ears. ).  Changing lesions on scalp, general skin check Location:  Duration:  Quality:  Associated Signs/Symptoms: Modifying Factors:  Severity:  Timing: Context:   Objective  Well appearing patient in no apparent distress; mood and affect are within normal limits. Left Lower Leg - Anterior, Right Lower Leg - Anterior Small patches chronic dermatitis compatible with nummular eczema, perhaps current stasis dermatitis.  Mid Back Multiple tan, brown flattopped textured 4 to 12 mm papules, compatible dermoscopy  Right Parietal Scalp Pink-tan 10 mm crust, dermoscopy favors I-S K versus CIS       Right Frontal Scalp Similar 7 mm crust       Left Breast Erythematous patches with gritty scale.    A full examination was performed including scalp, head, eyes, ears, nose, lips, neck, chest, axillae, abdomen, back, buttocks, bilateral upper extremities, bilateral lower extremities, hands, feet, fingers, toes, fingernails, and toenails. All findings within normal limits unless otherwise noted below.   Assessment & Plan    Nummular eczema Left Lower Leg - Anterior; Right Lower Leg - Anterior  Apply clobetasol daily after bathing to active areas for up to 4 weeks, taper if improves.  Do not use on face or body folds  clobetasol cream (TEMOVATE) 0.05 % - Left Lower Leg - Anterior, Right Lower Leg - Anterior Apply 1 application. topically 2 (two) times daily.  Seborrheic keratosis Mid Back  Leave if stable  Neoplasm of uncertain behavior of skin (2) Right Parietal Scalp  Skin / nail biopsy Type of biopsy: tangential    Informed consent: discussed and consent obtained   Timeout: patient name, date of birth, surgical site, and procedure verified   Anesthesia: the lesion was anesthetized in a standard fashion   Anesthetic:  1% lidocaine w/ epinephrine 1-100,000 local infiltration Instrument used: flexible razor blade   Hemostasis achieved with: ferric subsulfate   Outcome: patient tolerated procedure well   Post-procedure details: wound care instructions given    Specimen 1 - Surgical pathology Differential Diagnosis: scc vs bcc  Check Margins: No  Right Frontal Scalp  Skin / nail biopsy Type of biopsy: tangential   Informed consent: discussed and consent obtained   Timeout: patient name, date of birth, surgical site, and procedure verified   Anesthesia: the lesion was anesthetized in a standard fashion   Anesthetic:  1% lidocaine w/ epinephrine 1-100,000 local infiltration Instrument used: flexible razor blade   Hemostasis achieved with: ferric subsulfate   Outcome: patient tolerated procedure well   Post-procedure details: wound care instructions given    Specimen 2 - Surgical pathology Differential Diagnosis: scc vs bcc  Check Margins: No  AK (actinic keratosis) Left Breast  Destruction of lesion - Left Breast Complexity: simple   Destruction method: cryotherapy   Informed consent: discussed and consent obtained   Timeout:  patient name, date of birth, surgical site, and procedure verified Lesion destroyed using liquid nitrogen: Yes   Cryotherapy cycles:  1 Outcome: patient tolerated procedure well with no complications   Post-procedure details: wound care instructions given        I, Janalyn Harder, MD, have reviewed  all documentation for this visit.  The documentation on 12/25/21 for the exam, diagnosis, procedures, and orders are all accurate and complete.

## 2022-02-01 ENCOUNTER — Encounter: Payer: Self-pay | Admitting: Internal Medicine

## 2022-02-01 ENCOUNTER — Ambulatory Visit: Payer: Medicare PPO | Admitting: Internal Medicine

## 2022-02-01 DIAGNOSIS — H6123 Impacted cerumen, bilateral: Secondary | ICD-10-CM | POA: Diagnosis not present

## 2022-02-01 DIAGNOSIS — R944 Abnormal results of kidney function studies: Secondary | ICD-10-CM | POA: Diagnosis not present

## 2022-02-01 DIAGNOSIS — R739 Hyperglycemia, unspecified: Secondary | ICD-10-CM

## 2022-02-01 DIAGNOSIS — M353 Polymyalgia rheumatica: Secondary | ICD-10-CM | POA: Diagnosis not present

## 2022-02-01 DIAGNOSIS — M19042 Primary osteoarthritis, left hand: Secondary | ICD-10-CM | POA: Diagnosis not present

## 2022-02-01 DIAGNOSIS — I2583 Coronary atherosclerosis due to lipid rich plaque: Secondary | ICD-10-CM

## 2022-02-01 DIAGNOSIS — I2510 Atherosclerotic heart disease of native coronary artery without angina pectoris: Secondary | ICD-10-CM | POA: Diagnosis not present

## 2022-02-01 DIAGNOSIS — N320 Bladder-neck obstruction: Secondary | ICD-10-CM

## 2022-02-01 DIAGNOSIS — I10 Essential (primary) hypertension: Secondary | ICD-10-CM

## 2022-02-01 DIAGNOSIS — M19041 Primary osteoarthritis, right hand: Secondary | ICD-10-CM | POA: Diagnosis not present

## 2022-02-01 DIAGNOSIS — M1991 Primary osteoarthritis, unspecified site: Secondary | ICD-10-CM | POA: Insufficient documentation

## 2022-02-01 LAB — PSA: PSA: 0.42 ng/mL (ref 0.10–4.00)

## 2022-02-01 LAB — URINALYSIS
Bilirubin Urine: NEGATIVE
Hgb urine dipstick: NEGATIVE
Ketones, ur: NEGATIVE
Leukocytes,Ua: NEGATIVE
Nitrite: NEGATIVE
Specific Gravity, Urine: <=1.005 — AB (ref 1.000–1.030)
Total Protein, Urine: NEGATIVE
Urine Glucose: NEGATIVE
Urobilinogen, UA: 0.2 (ref 0.0–1.0)
pH: 6 (ref 5.0–8.0)

## 2022-02-01 LAB — LIPID PANEL
Cholesterol: 126 mg/dL (ref 0–200)
HDL: 47.5 mg/dL (ref >39.00)
LDL Cholesterol: 68 mg/dL (ref 0–99)
NonHDL: 78.6
Total CHOL/HDL Ratio: 3
Triglycerides: 54 mg/dL (ref 0.0–149.0)
VLDL: 10.8 mg/dL (ref 0.0–40.0)

## 2022-02-01 LAB — CBC WITH DIFFERENTIAL/PLATELET
Basophils Absolute: 0 10*3/uL (ref 0.0–0.1)
Basophils Relative: 0.4 % (ref 0.0–3.0)
Eosinophils Absolute: 0.2 10*3/uL (ref 0.0–0.7)
Eosinophils Relative: 3.1 % (ref 0.0–5.0)
HCT: 44 % (ref 39.0–52.0)
Hemoglobin: 14.6 g/dL (ref 13.0–17.0)
Lymphocytes Relative: 34.3 % (ref 12.0–46.0)
Lymphs Abs: 2.2 10*3/uL (ref 0.7–4.0)
MCHC: 33.2 g/dL (ref 30.0–36.0)
MCV: 88.9 fl (ref 78.0–100.0)
Monocytes Absolute: 0.5 10*3/uL (ref 0.1–1.0)
Monocytes Relative: 7 % (ref 3.0–12.0)
Neutro Abs: 3.6 10*3/uL (ref 1.4–7.7)
Neutrophils Relative %: 55.2 % (ref 43.0–77.0)
Platelets: 119 10*3/uL — ABNORMAL LOW (ref 150.0–400.0)
RBC: 4.95 Mil/uL (ref 4.22–5.81)
RDW: 14.9 % (ref 11.5–15.5)
WBC: 6.5 10*3/uL (ref 4.0–10.5)

## 2022-02-01 LAB — COMPREHENSIVE METABOLIC PANEL
ALT: 21 U/L (ref 0–53)
AST: 26 U/L (ref 0–37)
Albumin: 4.9 g/dL (ref 3.5–5.2)
Alkaline Phosphatase: 88 U/L (ref 39–117)
BUN: 17 mg/dL (ref 6–23)
CO2: 30 mEq/L (ref 19–32)
Calcium: 9.7 mg/dL (ref 8.4–10.5)
Chloride: 102 mEq/L (ref 96–112)
Creatinine, Ser: 1 mg/dL (ref 0.40–1.50)
GFR: 72.71 mL/min (ref >60.00)
Glucose, Bld: 117 mg/dL — ABNORMAL HIGH (ref 70–99)
Potassium: 4.5 mEq/L (ref 3.5–5.1)
Sodium: 140 mEq/L (ref 135–145)
Total Bilirubin: 0.6 mg/dL (ref 0.2–1.2)
Total Protein: 7.6 g/dL (ref 6.0–8.3)

## 2022-02-01 LAB — TSH: TSH: 1.51 u[IU]/mL (ref 0.35–5.50)

## 2022-02-01 LAB — HEMOGLOBIN A1C: Hgb A1c MFr Bld: 6.5 % (ref 4.6–6.5)

## 2022-02-01 NOTE — Assessment & Plan Note (Signed)
Cont on Lipitor, Plavix, Losartan, Toprol, Zetia Niacin

## 2022-02-01 NOTE — Assessment & Plan Note (Signed)
Irrigate w/a kit

## 2022-02-01 NOTE — Assessment & Plan Note (Signed)
Blue-Emu cream was recommended to use 2-3 times a day ? ?

## 2022-02-01 NOTE — Patient Instructions (Signed)
Blue-Emu cream -- use 2-3 times a day ? ?

## 2022-02-01 NOTE — Assessment & Plan Note (Addendum)
F/u w/Dr Alinda Money On [place]

## 2022-02-01 NOTE — Progress Notes (Signed)
Subjective:  Patient ID: DWAYN MORAVEK, male    DOB: March 14, 1946  Age: 76 y.o. MRN: [id number]  CC: No chief complaint on file.   HPI NEVIN GRIZZLE presents for insomnia, BPH, HTN, anxiety, h/o PMR  Outpatient Medications Prior to Visit  Medication Sig Dispense Refill   ALPRAZolam (XANAX) 0.5 MG tablet Take 1 tablet (0.5 mg total) by mouth at bedtime as needed for anxiety. 90 tablet 3   atorvastatin (LIPITOR) 80 MG tablet TAKE 1 TABLET EVERY DAY. MUST KEEP UPCOMING APPOINTMENT IN FEBRUARY 2023 90 tablet 2   b complex vitamins capsule Take 1 capsule by mouth daily.       Cholecalciferol (VITAMIN D) 2000 UNIT CAPS Take 2,000 Units by mouth daily.      clobetasol cream (TEMOVATE) 4.48 % Apply 1 application. topically 2 (two) times daily. 30 g 2   clopidogrel (PLAVIX) 75 MG tablet TAKE 1 TABLET EVERY DAY 90 tablet 3   doxycycline (VIBRAMYCIN) 100 MG capsule Take by mouth.     ezetimibe (ZETIA) 10 MG tablet TAKE 1 TABLET EVERY DAY. MUST KEEP UPCOMING APPOINTMENT IN FEBRUARY 2023 90 tablet 2   glucosamine-chondroitin 500-400 MG tablet Take 1 tablet by mouth in the morning and at bedtime.     losartan (COZAAR) 25 MG tablet TAKE 1 TABLET EVERY DAY 90 tablet 3   metoprolol succinate (TOPROL-XL) 50 MG 24 hr tablet TAKE 1 TABLET BY MOUTH EVERY DAY WITH OR IMMEDIATELY AFTER A MEAL 90 tablet 3   naproxen (NAPROSYN) 500 MG tablet 1 tablet with food or milk as needed     niacin (NIASPAN) 500 MG CR tablet Take 2,000 mg by mouth at bedtime.     nitroGLYCERIN (NITROSTAT) 0.4 MG SL tablet Place 1 tablet (0.4 mg total) under the tongue every 5 (five) minutes as needed for chest pain. 25 tablet 3   pantoprazole (PROTONIX) 40 MG tablet TAKE 1 TABLET EVERY DAY. MUST KEEP UPCOMING APPOINTMENT IN FEBRUARY 2023 90 tablet 2   sildenafil (REVATIO) 20 MG tablet Take 2-5 tablets by mouth as needed prior to intercourse 90 tablet 1   triamcinolone cream (KENALOG) 0.1 % Apply 1 application topically as needed.      Vibegron (GEMTESA) 75 MG TABS Take by mouth. Per patient "I will be taking". Haven't started taking medication     No facility-administered medications prior to visit.    ROS: Review of Systems  Constitutional:  Negative for appetite change, fatigue and unexpected weight change.  HENT:  Negative for congestion, nosebleeds, sneezing, sore throat and trouble swallowing.   Eyes:  Negative for itching and visual disturbance.  Respiratory:  Negative for cough.   Cardiovascular:  Negative for chest pain, palpitations and leg swelling.  Gastrointestinal:  Negative for abdominal distention, blood in stool, diarrhea and nausea.  Genitourinary:  Positive for frequency and urgency. Negative for hematuria.  Musculoskeletal:  Negative for back pain, gait problem, joint swelling and neck pain.  Skin:  Negative for rash.  Neurological:  Negative for dizziness, tremors, speech difficulty and weakness.  Psychiatric/Behavioral:  Positive for sleep disturbance. Negative for agitation and dysphoric mood. The patient is not nervous/anxious.     Objective:  BP 124/72 (BP Location: Left Arm, Patient Position: Sitting, Cuff Size: Large)   Pulse (!) 59   Temp 98.2 F (36.8 C) (Oral)   Ht '5\' 6"'  (1.676 m)   Wt 160 lb (72.6 kg)   SpO2 99%   BMI 25.82 kg/m   BP Readings  from Last 3 Encounters:  02/01/22 124/72  08/05/21 124/80  07/30/21 118/60    Wt Readings from Last 3 Encounters:  02/01/22 160 lb (72.6 kg)  08/19/21 171 lb (77.6 kg)  08/05/21 171 lb (77.6 kg)    Physical Exam Constitutional:      General: He is not in acute distress.    Appearance: He is well-developed.     Comments: NAD  Eyes:     Conjunctiva/sclera: Conjunctivae normal.     Pupils: Pupils are equal, round, and reactive to light.  Neck:     Thyroid: No thyromegaly.     Vascular: No JVD.  Cardiovascular:     Rate and Rhythm: Normal rate and regular rhythm.     Heart sounds: Normal heart sounds. No murmur heard.    No  friction rub. No gallop.  Pulmonary:     Effort: Pulmonary effort is normal. No respiratory distress.     Breath sounds: Normal breath sounds. No wheezing or rales.  Chest:     Chest wall: No tenderness.  Abdominal:     General: Bowel sounds are normal. There is no distension.     Palpations: Abdomen is soft. There is no mass.     Tenderness: There is no abdominal tenderness. There is no guarding or rebound.  Musculoskeletal:        General: No tenderness. Normal range of motion.     Cervical back: Normal range of motion.  Lymphadenopathy:     Cervical: No cervical adenopathy.  Skin:    General: Skin is warm and dry.     Findings: No rash.  Neurological:     Mental Status: He is alert and oriented to person, place, and time.     Cranial Nerves: No cranial nerve deficit.     Motor: No abnormal muscle tone.     Coordination: Coordination normal.     Gait: Gait normal.     Deep Tendon Reflexes: Reflexes are normal and symmetric.  Psychiatric:        Behavior: Behavior normal.        Thought Content: Thought content normal.        Judgment: Judgment normal.   B wax  Lab Results  Component Value Date   WBC 8.8 09/01/2020   HGB 15.0 09/01/2020   HCT 45.0 09/01/2020   PLT 154.0 09/01/2020   GLUCOSE 97 02/24/2021   CHOL 137 09/01/2020   TRIG 72.0 09/01/2020   HDL 54.40 09/01/2020   LDLCALC 68 09/01/2020   ALT 21 02/24/2021   AST 24 02/24/2021   NA 139 02/24/2021   K 4.9 02/24/2021   CL 102 02/24/2021   CREATININE 1.21 02/24/2021   BUN 20 02/24/2021   CO2 31 02/24/2021   TSH 1.12 09/01/2020   PSA 0.53 09/01/2020   INR 1.1 RATIO (H) 05/16/2008   HGBA1C 7.0 (H) 02/24/2021    VAS US AORTA/IVC/ILIACS  Result Date: 05/22/2021 ABDOMINAL AORTA STUDY Patient Name:  YAZIEL BRANDON  Date of Exam:   05/21/2021 Medical Rec #: [id number]      Accession #:    [id number] Date of Birth: [birth year]      Patient Gender: M Patient Age:   67 years Exam Location:  [facility] Procedure:       VAS US AORTA/IVC/ILIACS Referring Phys: Legrand Como COOPER --------------------------------------------------------------------------------  Indications: Peripheral arterial disease, and patient denies any claudication              symptoms or rest pain.  Today's ABIs are .88 on the right and .90 on the left. Risk Factors: Hypertension, hyperlipidemia, past history of smoking, prior MI,               coronary artery disease. Limitations: Air/bowel gas.  Comparison Study: In 05/2020, a lower arterial duplex performed at                   [facility] at [facility] showed velocities of                   309 cm/s in the distal aorta and 294 cm/s in the right                   proximal common iliac artery, both suggesting >50% stenosis.                   Highest velocities in the left common iliac artery was in the                   proximal segment at 171 cm/s, not consistent with prior exam                   of 347 cm/s on 05/21/2019. Performing Technologist: Sharlett Iles RVT  Examination Guidelines: A complete evaluation includes B-mode imaging, spectral Doppler, color Doppler, and power Doppler as needed of all accessible portions of each vessel. Bilateral testing is considered an integral part of a complete examination. Limited examinations for reoccurring indications may be performed as noted.  Abdominal Aorta Findings: +-------------+-------+----------+------+----------------+--------+------------+ Location     AP (cm)Trans (cm)PSV   Waveform        ThrombusComments                                   (cm/s)                                     +-------------+-------+----------+------+----------------+--------+------------+ Proximal     2.00   2.00      107   triphasic               dampened     +-------------+-------+----------+------+----------------+--------+------------+ Mid                           147   triphasic               dampened      +-------------+-------+----------+------+----------------+--------+------------+ Distal                        360   biphasic                turbulent    +-------------+-------+----------+------+----------------+--------+------------+ RT CIA Prox                   369   triphasic               turbulent    +-------------+-------+----------+------+----------------+--------+------------+ RT CIA Mid                    220   triphasic               turbulent    +-------------+-------+----------+------+----------------+--------+------------+ RT CIA Distal  137   triphasic                            +-------------+-------+----------+------+----------------+--------+------------+ RT EIA Prox                   125   triphasic                            +-------------+-------+----------+------+----------------+--------+------------+ RT EIA Mid                    132   triphasic                            +-------------+-------+----------+------+----------------+--------+------------+ RT EIA Distal                 104   barely triphasic                     +-------------+-------+----------+------+----------------+--------+------------+ LT CIA Prox                   521   triphasic               turbulent,                                                               increase in                                                              velocities                                                               when                                                                     compared to                                                              exam on  05/21/2019   +-------------+-------+----------+------+----------------+--------+------------+ LT CIA Mid                    234   triphasic                             +-------------+-------+----------+------+----------------+--------+------------+ LT CIA Distal                 195   triphasic                            +-------------+-------+----------+------+----------------+--------+------------+ LT EIA Prox                   164   multiphasic                          +-------------+-------+----------+------+----------------+--------+------------+ LT EIA Mid                    102   barely triphasic                     +-------------+-------+----------+------+----------------+--------+------------+ LT EIA Distal                 119   barely triphasic                     +-------------+-------+----------+------+----------------+--------+------------+ IVC/Iliac Findings: +--------+------+--------+--------+   IVC   PatentThrombusComments +--------+------+--------+--------+ IVC Proxpatent                 +--------+------+--------+--------+    Summary: Abdominal Aorta: Aortoiliac atherosclerosis. The largest aortic measurement is 2.0 cm. Stenosis: +--------------------+-------------+-------------------------------------------+ Location            Stenosis     Comments                                    +--------------------+-------------+-------------------------------------------+ Distal Aorta        >50% stenosisstable velocities                           +--------------------+-------------+-------------------------------------------+ Right Common Iliac  >50% stenosisstable velocities                           +--------------------+-------------+-------------------------------------------+ Left Common Iliac   >50% stenosisincrease velocities at the ostium compared                                   to prior exam in 05/2019                    +--------------------+-------------+-------------------------------------------+ Right External Iliac<50% stenosis                                             +--------------------+-------------+-------------------------------------------+ Left External Iliac <50% stenosis                                            +--------------------+-------------+-------------------------------------------+  IVC/Iliac: Patent IVC.  *  See table(s) above for measurements and observations. See ABI report. Suggest follow up study in 12 months.  Electronically signed by Carlyle Dolly MD on 05/22/2021 at 4:23:43 PM.    Final    VAS [place] ABI WITH/WO TBI  Result Date: 05/22/2021  LOWER EXTREMITY DOPPLER STUDY Patient Name:  KEISEAN SKOWRON  Date of Exam:   05/21/2021 Medical Rec #: [id number]      Accession #:    [id number] Date of Birth: 1946/06/26      Patient Gender: M Patient Age:   65 years Exam Location:  [facility] Procedure:      VAS [place] ABI WITH/WO TBI Referring Phys: MICHAEL COOPER --------------------------------------------------------------------------------  Indications: Peripheral artery disease, and and patient denies any claudication              symptoms or rest pain. High Risk Factors: Hypertension, hyperlipidemia, past history of smoking, prior                    MI, coronary artery disease.  Comparison Study: In 05/2020, a lower arterial Doppler showed an ABI of .95 on                   the right and .85 on the left. Performing Technologist: Sharlett Iles RVT  Examination Guidelines: A complete evaluation includes at minimum, Doppler waveform signals and systolic blood pressure reading at the level of bilateral brachial, anterior tibial, and posterior tibial arteries, when vessel segments are accessible. Bilateral testing is considered an integral part of a complete examination. Photoelectric Plethysmograph (PPG) waveforms and toe systolic pressure readings are included as required and additional duplex testing as needed. Limited examinations for reoccurring indications may be performed as noted.  ABI Findings: +---------+------------------+-----+-----------+--------+  Right    Rt Pressure (mmHg)IndexWaveform   Comment  +---------+------------------+-----+-----------+--------+ Brachial 143                                        +---------+------------------+-----+-----------+--------+ PTA      126               0.88 triphasic           +---------+------------------+-----+-----------+--------+ PERO     120                    multiphasic.84      +---------+------------------+-----+-----------+--------+ DP       118               0.83 triphasic           +---------+------------------+-----+-----------+--------+ Great Toe103               0.72                     +---------+------------------+-----+-----------+--------+ +---------+------------------+-----+-----------+-------+ Left     Lt Pressure (mmHg)IndexWaveform   Comment +---------+------------------+-----+-----------+-------+ Brachial 143                                       +---------+------------------+-----+-----------+-------+ PTA      128               0.90 triphasic          +---------+------------------+-----+-----------+-------+ PERO     119  multiphasic.83     +---------+------------------+-----+-----------+-------+ DP       114               0.80 multiphasic        +---------+------------------+-----+-----------+-------+ Great Toe113               0.79                    +---------+------------------+-----+-----------+-------+ +-------+-----------+-----------+------------+------------+ ABI/TBIToday's ABIToday's TBIPrevious ABIPrevious TBI +-------+-----------+-----------+------------+------------+ Right  .88        .72        .95         .59          +-------+-----------+-----------+------------+------------+ Left   .90        .79        .85         .55          +-------+-----------+-----------+------------+------------+ Right ABIs appear decreased compared to prior study on 05/21/2020. Left ABIs appear  essentially unchanged compared to prior study on 05/21/2020. Bilateral TBIs appear increased compared to prior study on 05/21/2020.  Summary: Right: Resting right ankle-brachial index indicates mild right lower extremity arterial disease. The right toe-brachial index is normal. Left: Resting left ankle-brachial index indicates mild left lower extremity arterial disease. The left toe-brachial index is normal.  *See table(s) above for measurements and observations. See Aortoiliac duplex report.  Suggest follow up study in 12 months. Electronically signed by Carlyle Dolly MD on 05/22/2021 at 4:03:20 PM.    Final     Assessment & Plan:   Problem List Items Addressed This Visit     Bladder neck obstruction    F/u w/Dr Alinda [facility]       CAD (coronary artery disease)    Cont on Lipitor, Plavix, Losartan, Toprol, Zetia Niacin      Cerumen impaction    Irrigate w/a kit      Decreased GFR   Essential hypertension   Hyperglycemia   PMR (polymyalgia rheumatica) (HCC)   Primary osteoarthritis    Blue-Emu cream was recommended to use 2-3 times a day          No orders of the defined types were placed in this encounter.     Follow-up: Return in about 6 months (around 08/04/2022) for Wellness Exam.  Walker Kehr, MD

## 2022-02-09 DIAGNOSIS — Z7952 Long term (current) use of systemic steroids: Secondary | ICD-10-CM | POA: Diagnosis not present

## 2022-02-09 DIAGNOSIS — E663 Overweight: Secondary | ICD-10-CM | POA: Diagnosis not present

## 2022-02-09 DIAGNOSIS — Z6826 Body mass index (BMI) 26.0-26.9, adult: Secondary | ICD-10-CM | POA: Diagnosis not present

## 2022-02-09 DIAGNOSIS — M1991 Primary osteoarthritis, unspecified site: Secondary | ICD-10-CM | POA: Diagnosis not present

## 2022-02-09 DIAGNOSIS — M353 Polymyalgia rheumatica: Secondary | ICD-10-CM | POA: Diagnosis not present

## 2022-03-15 ENCOUNTER — Other Ambulatory Visit: Payer: Self-pay | Admitting: Internal Medicine

## 2022-05-01 ENCOUNTER — Other Ambulatory Visit: Payer: Self-pay | Admitting: Internal Medicine

## 2022-05-03 NOTE — Telephone Encounter (Signed)
Patient is going out of town and needs his rx for nitroglycerin refilled today.  He said it must be today

## 2022-07-08 IMAGING — US US RENAL
1 series · 14 of 25 positions shown · non-contrast
Comparison: None.

CLINICAL DATA: Decreased GFR, new.

EXAM:
RENAL / URINARY TRACT ULTRASOUND COMPLETE

[Series 1: us renal · 0.22mm/px · 14 of 43 slices shown]
[im 1/43]
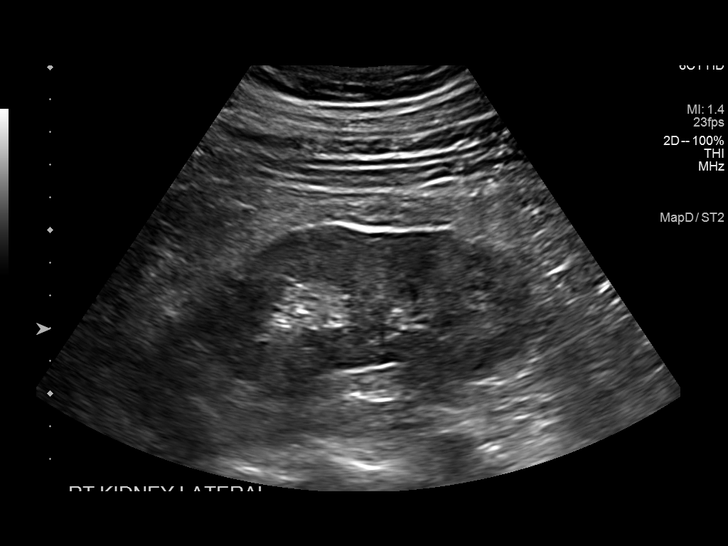
[im 4/43]
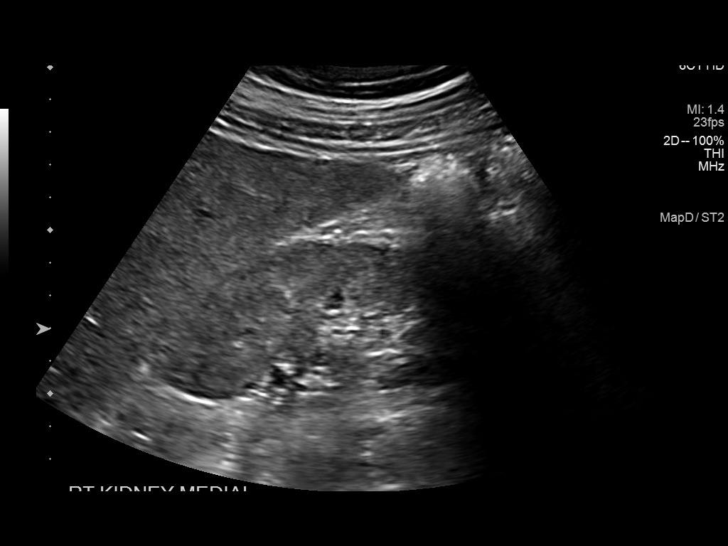
[im 8/43]
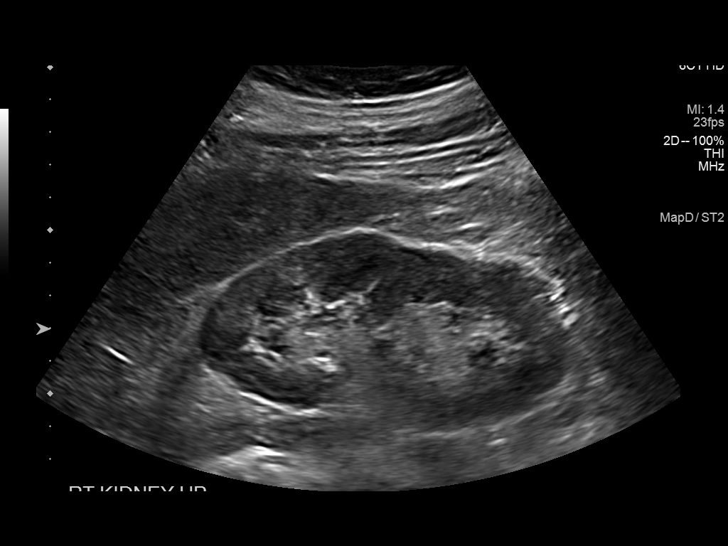
[im 11/43]
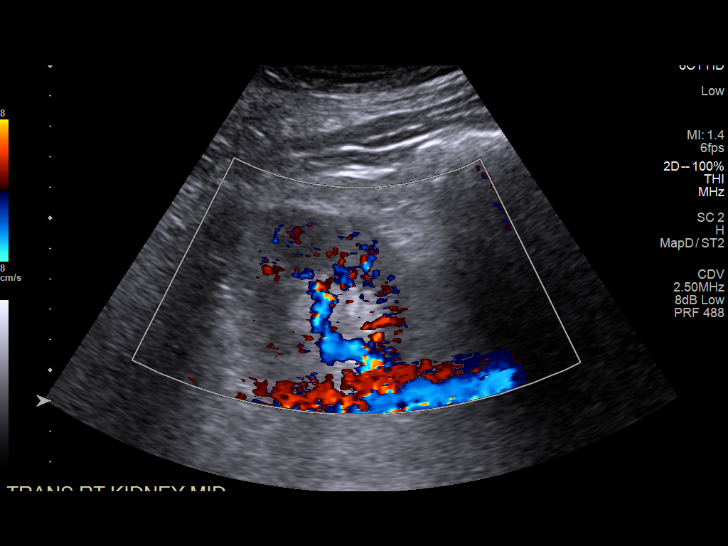
[im 15/43]
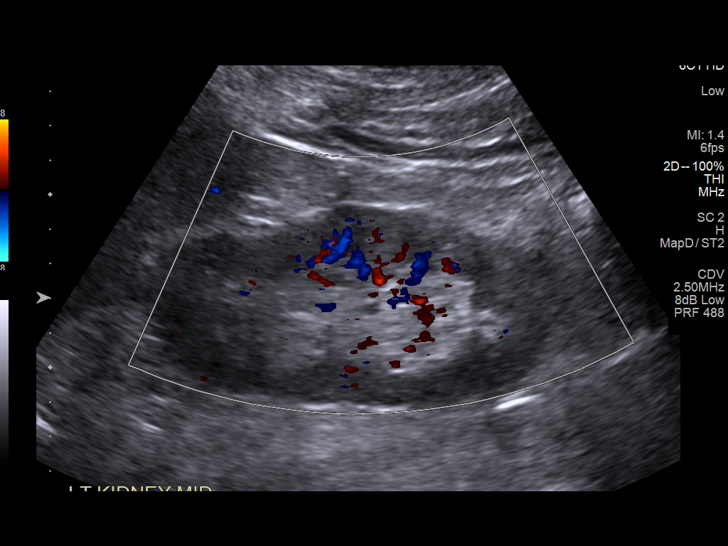
[im 16/43]
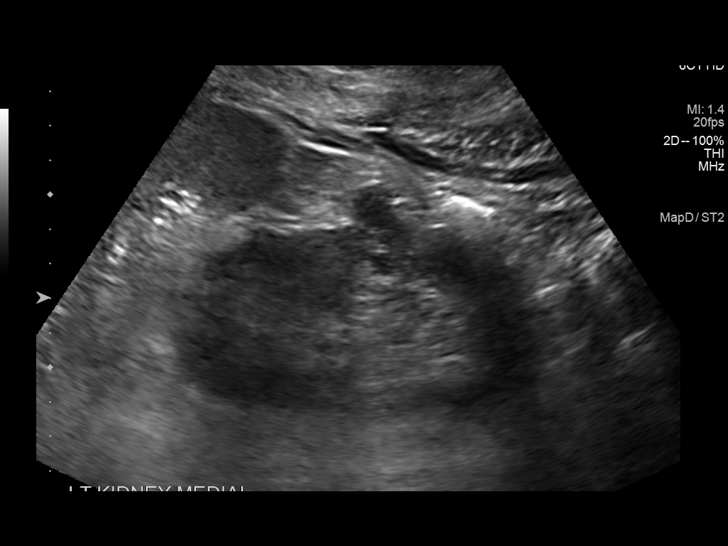
[im 20/43]
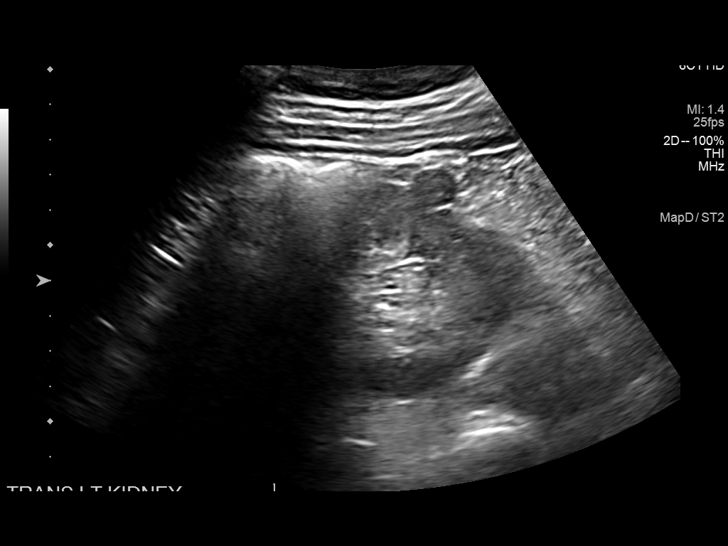
[im 23/43]
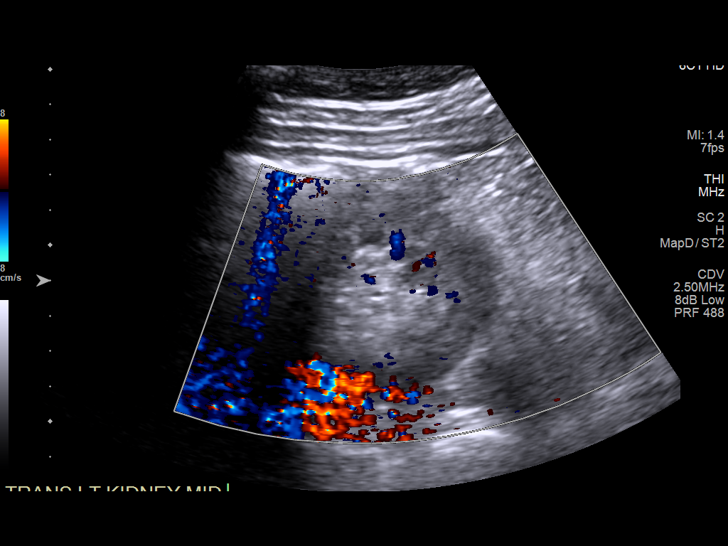
[im 27/43]
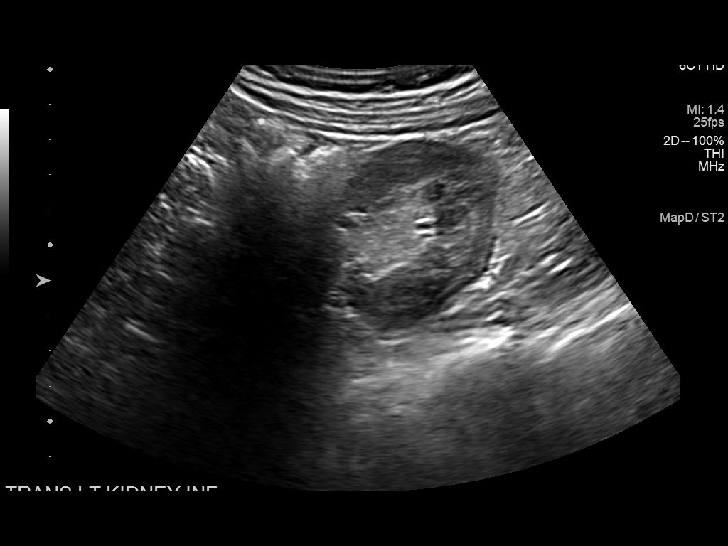
[im 29/43]
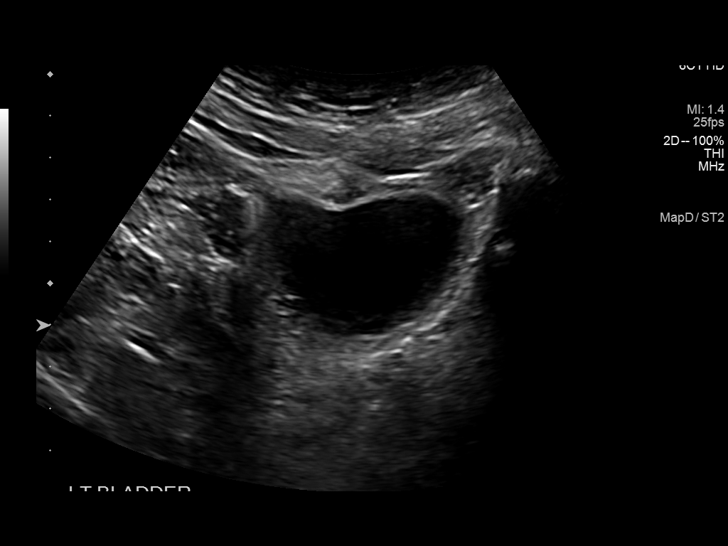
[im 32/43]
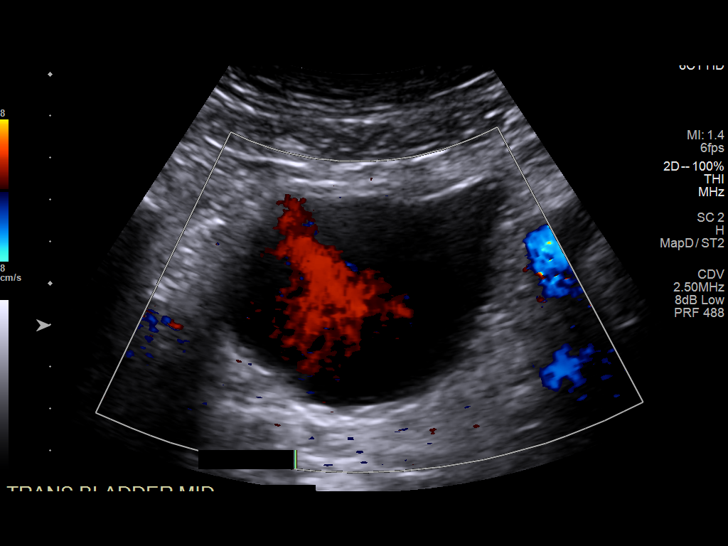
[im 36/43]
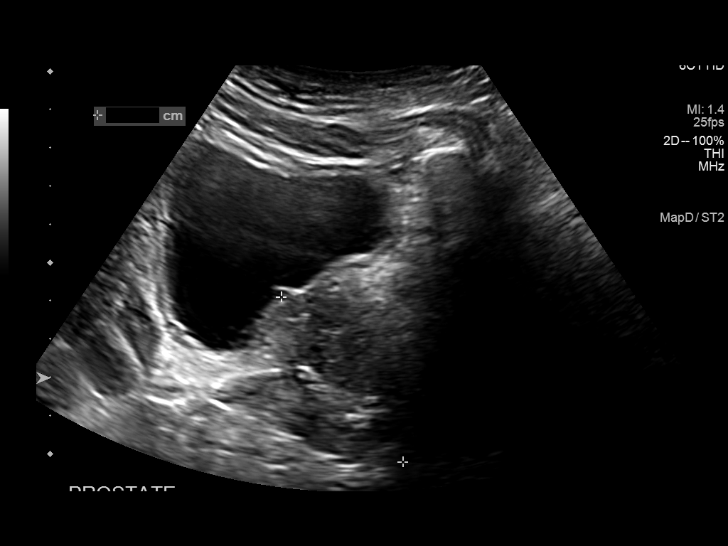
[im 39/43]
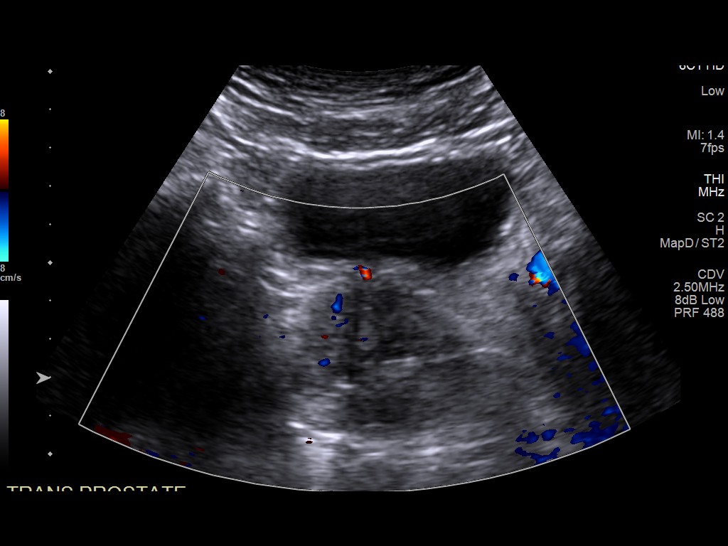
[im 43/43]
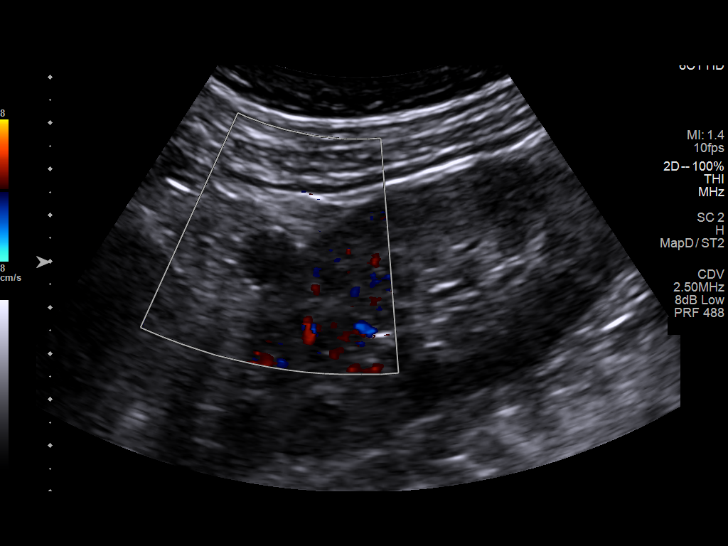

[14 of 25 positions shown; findings below may reference images not displayed]

FINDINGS: Right Kidney:

Renal measurements: 11.3 x 5.3 x 6.6 cm = volume: 208 mL. No mass or
hydronephrosis visualized.

Left Kidney:

Renal measurements: 12.3 x 5.9 x 5.9 cm = volume: 222 mL. No
hydronephrosis. There is an exophytic cyst measuring 1.3 x 1.4 x
cm which appears simple.

Bladder:

Appears normal for degree of bladder distention.

Other:

None.
IMPRESSION: No hydronephrosis.

Exophytic left renal cyst measuring up to 1.6 cm, which appears
simple.

## 2022-08-02 ENCOUNTER — Telehealth: Payer: Self-pay

## 2022-08-02 NOTE — Telephone Encounter (Signed)
Left message for patient to call back to schedule Medicare Annual Wellness Visit   Last AWV  07/30/21  Please schedule at anytime with [facility] if patient calls the office back.    30 Minutes appointment   Any questions, please call me at [phone]

## 2022-08-04 ENCOUNTER — Encounter: Payer: Self-pay | Admitting: Internal Medicine

## 2022-08-04 ENCOUNTER — Ambulatory Visit (INDEPENDENT_AMBULATORY_CARE_PROVIDER_SITE_OTHER): Payer: Medicare PPO | Admitting: Internal Medicine

## 2022-08-04 VITALS — BP 122/72 | HR 63 | Temp 97.9°F | Ht 66.0 in | Wt 163.0 lb

## 2022-08-04 DIAGNOSIS — K2270 Barrett's esophagus without dysplasia: Secondary | ICD-10-CM

## 2022-08-04 DIAGNOSIS — M353 Polymyalgia rheumatica: Secondary | ICD-10-CM | POA: Diagnosis not present

## 2022-08-04 DIAGNOSIS — E785 Hyperlipidemia, unspecified: Secondary | ICD-10-CM

## 2022-08-04 DIAGNOSIS — R0989 Other specified symptoms and signs involving the circulatory and respiratory systems: Secondary | ICD-10-CM | POA: Diagnosis not present

## 2022-08-04 DIAGNOSIS — N320 Bladder-neck obstruction: Secondary | ICD-10-CM

## 2022-08-04 DIAGNOSIS — Z0000 Encounter for general adult medical examination without abnormal findings: Secondary | ICD-10-CM

## 2022-08-04 DIAGNOSIS — H6123 Impacted cerumen, bilateral: Secondary | ICD-10-CM | POA: Diagnosis not present

## 2022-08-04 DIAGNOSIS — R739 Hyperglycemia, unspecified: Secondary | ICD-10-CM

## 2022-08-04 DIAGNOSIS — Z125 Encounter for screening for malignant neoplasm of prostate: Secondary | ICD-10-CM | POA: Diagnosis not present

## 2022-08-04 DIAGNOSIS — R053 Chronic cough: Secondary | ICD-10-CM

## 2022-08-04 LAB — CBC WITH DIFFERENTIAL/PLATELET
Basophils Absolute: 0 10*3/uL (ref 0.0–0.1)
Basophils Relative: 0.6 % (ref 0.0–3.0)
Eosinophils Absolute: 0.2 10*3/uL (ref 0.0–0.7)
Eosinophils Relative: 3.3 % (ref 0.0–5.0)
HCT: 44.2 % (ref 39.0–52.0)
Hemoglobin: 14.8 g/dL (ref 13.0–17.0)
Lymphocytes Relative: 31.3 % (ref 12.0–46.0)
Lymphs Abs: 2.2 10*3/uL (ref 0.7–4.0)
MCHC: 33.6 g/dL (ref 30.0–36.0)
MCV: 89.5 fl (ref 78.0–100.0)
Monocytes Absolute: 0.5 10*3/uL (ref 0.1–1.0)
Monocytes Relative: 6.9 % (ref 3.0–12.0)
Neutro Abs: 4 10*3/uL (ref 1.4–7.7)
Neutrophils Relative %: 57.9 % (ref 43.0–77.0)
Platelets: 139 10*3/uL — ABNORMAL LOW (ref 150.0–400.0)
RBC: 4.94 Mil/uL (ref 4.22–5.81)
RDW: 14.6 % (ref 11.5–15.5)
WBC: 6.9 10*3/uL (ref 4.0–10.5)

## 2022-08-04 LAB — TSH: TSH: 1.6 u[IU]/mL (ref 0.35–5.50)

## 2022-08-04 LAB — COMPREHENSIVE METABOLIC PANEL
ALT: 21 U/L (ref 0–53)
AST: 26 U/L (ref 0–37)
Albumin: 4.7 g/dL (ref 3.5–5.2)
Alkaline Phosphatase: 83 U/L (ref 39–117)
BUN: 20 mg/dL (ref 6–23)
CO2: 31 mEq/L (ref 19–32)
Calcium: 9.6 mg/dL (ref 8.4–10.5)
Chloride: 103 mEq/L (ref 96–112)
Creatinine, Ser: 1.18 mg/dL (ref 0.40–1.50)
GFR: 59.4 mL/min — ABNORMAL LOW (ref >60.00)
Glucose, Bld: 132 mg/dL — ABNORMAL HIGH (ref 70–99)
Potassium: 4.3 mEq/L (ref 3.5–5.1)
Sodium: 140 mEq/L (ref 135–145)
Total Bilirubin: 0.7 mg/dL (ref 0.2–1.2)
Total Protein: 7.1 g/dL (ref 6.0–8.3)

## 2022-08-04 LAB — LIPID PANEL
Cholesterol: 117 mg/dL (ref 0–200)
HDL: 43.4 mg/dL (ref >39.00)
LDL Cholesterol: 60 mg/dL (ref 0–99)
NonHDL: 73.16
Total CHOL/HDL Ratio: 3
Triglycerides: 66 mg/dL (ref 0.0–149.0)
VLDL: 13.2 mg/dL (ref 0.0–40.0)

## 2022-08-04 LAB — URINALYSIS
Bilirubin Urine: NEGATIVE
Hgb urine dipstick: NEGATIVE
Ketones, ur: NEGATIVE
Leukocytes,Ua: NEGATIVE
Nitrite: NEGATIVE
Specific Gravity, Urine: 1.015 (ref 1.000–1.030)
Total Protein, Urine: NEGATIVE
Urine Glucose: NEGATIVE
Urobilinogen, UA: 0.2 (ref 0.0–1.0)
pH: 5.5 (ref 5.0–8.0)

## 2022-08-04 LAB — HEMOGLOBIN A1C: Hgb A1c MFr Bld: 6.3 % (ref 4.6–6.5)

## 2022-08-04 LAB — PSA: PSA: 0.54 ng/mL (ref 0.10–4.00)

## 2022-08-04 NOTE — Assessment & Plan Note (Signed)
Off Prednisone

## 2022-08-04 NOTE — Assessment & Plan Note (Signed)

## 2022-08-04 NOTE — Assessment & Plan Note (Signed)
Use Debrox kit

## 2022-08-04 NOTE — Assessment & Plan Note (Signed)
Check PSA. ?

## 2022-08-04 NOTE — Assessment & Plan Note (Signed)
Will ref to Dr Henrene Pastor Last EGD 2019 - Dr Earlean Shawl On Protonix

## 2022-08-04 NOTE — Progress Notes (Signed)
Subjective:  Patient ID: Joshua Brown, male    DOB: 10-02-1945  Age: 77 y.o. MRN: [id number]  CC: Annual Exam   HPI Joshua Brown presents for a well exam F/u on anxiety,  Barrett's, colon polyps  Outpatient Medications Prior to Visit  Medication Sig Dispense Refill   ALPRAZolam (XANAX) 0.5 MG tablet TAKE 1 TABLET AT BEDTIME AS NEEDED FOR ANXIETY 30 tablet 2   atorvastatin (LIPITOR) 80 MG tablet TAKE 1 TABLET EVERY DAY. MUST KEEP UPCOMING APPOINTMENT IN FEBRUARY 2023 90 tablet 2   b complex vitamins capsule Take 1 capsule by mouth daily.       Cholecalciferol (VITAMIN D) 2000 UNIT CAPS Take 2,000 Units by mouth daily.      clobetasol cream (TEMOVATE) [id number] % Apply 1 application. topically 2 (two) times daily. 30 g 2   clopidogrel (PLAVIX) 75 MG tablet TAKE 1 TABLET EVERY DAY 90 tablet 3   ezetimibe (ZETIA) 10 MG tablet TAKE 1 TABLET EVERY DAY. MUST KEEP UPCOMING APPOINTMENT IN FEBRUARY 2023 90 tablet 2   glucosamine-chondroitin 500-400 MG tablet Take 1 tablet by mouth in the morning and at bedtime.     losartan (COZAAR) 25 MG tablet TAKE 1 TABLET EVERY DAY 90 tablet 3   metoprolol succinate (TOPROL-XL) 50 MG 24 hr tablet TAKE 1 TABLET BY MOUTH EVERY DAY WITH OR IMMEDIATELY AFTER A MEAL 90 tablet 3   naproxen (NAPROSYN) 500 MG tablet 1 tablet with food or milk as needed     niacin (NIASPAN) 500 MG CR tablet Take 2,000 mg by mouth at bedtime.     nitroGLYCERIN (NITROSTAT) 0.4 MG SL tablet PLACE 1 TABLET UNDER THE TONGUE EVERY 5 MINUTES AS NEEDED FOR CHEST PAIN. 25 tablet 3   pantoprazole (PROTONIX) 40 MG tablet TAKE 1 TABLET EVERY DAY. MUST KEEP UPCOMING APPOINTMENT IN FEBRUARY 2023 90 tablet 2   sildenafil (REVATIO) 20 MG tablet Take 2-5 tablets by mouth as needed prior to intercourse 90 tablet 1   triamcinolone cream (KENALOG) 0.1 % Apply 1 application topically as needed.     doxycycline (VIBRAMYCIN) 100 MG capsule Take by mouth.     Vibegron (GEMTESA) 75 MG TABS Take by mouth. Per  patient "I will be taking". Haven't started taking medication     No facility-administered medications prior to visit.    ROS: Review of Systems  Constitutional:  Negative for appetite change, fatigue and unexpected weight change.  HENT:  Negative for congestion, nosebleeds, sneezing, sore throat and trouble swallowing.   Eyes:  Negative for itching and visual disturbance.  Respiratory:  Positive for cough.   Cardiovascular:  Negative for chest pain, palpitations and leg swelling.  Gastrointestinal:  Negative for abdominal distention, blood in stool, constipation, diarrhea and nausea.  Genitourinary:  Negative for frequency and hematuria.  Musculoskeletal:  Negative for back pain, gait problem, joint swelling and neck pain.  Skin:  Negative for rash.  Neurological:  Negative for dizziness, tremors, speech difficulty and weakness.  Psychiatric/Behavioral:  Positive for decreased concentration. Negative for agitation, dysphoric mood, sleep disturbance and suicidal ideas. The patient is not nervous/anxious.     Objective:  BP 122/72 (BP Location: Left Arm, Patient Position: Sitting, Cuff Size: Normal)   Pulse 63   Temp 97.9 F (36.6 C) (Oral)   Ht 5' 6"$  (1.676 m)   Wt 163 lb (73.9 kg)   SpO2 96%   BMI 26.31 kg/m   BP Readings from Last 3 Encounters:  08/04/22 122/72  02/01/22 124/72  08/05/21 124/80    Wt Readings from Last 3 Encounters:  08/04/22 163 lb (73.9 kg)  02/01/22 160 lb (72.6 kg)  08/19/21 171 lb (77.6 kg)    Physical Exam Constitutional:      General: He is not in acute distress.    Appearance: He is well-developed.     Comments: NAD  Eyes:     Conjunctiva/sclera: Conjunctivae normal.     Pupils: Pupils are equal, round, and reactive to light.  Neck:     Thyroid: No thyromegaly.     Vascular: No JVD.  Cardiovascular:     Rate and Rhythm: Normal rate and regular rhythm.     Heart sounds: Normal heart sounds. No murmur heard.    No friction rub. No  gallop.  Pulmonary:     Effort: Pulmonary effort is normal. No respiratory distress.     Breath sounds: Normal breath sounds. No wheezing or rales.  Chest:     Chest wall: No tenderness.  Abdominal:     General: Bowel sounds are normal. There is no distension.     Palpations: Abdomen is soft. There is no mass.     Tenderness: There is no abdominal tenderness. There is no guarding or rebound.  Genitourinary:    Prostate: Normal.     Rectum: Normal. Guaiac result negative.  Musculoskeletal:        General: No tenderness. Normal range of motion.     Cervical back: Normal range of motion.  Lymphadenopathy:     Cervical: No cervical adenopathy.  Skin:    General: Skin is warm and dry.     Findings: No rash.  Neurological:     Mental Status: He is alert and oriented to person, place, and time.     Cranial Nerves: No cranial nerve deficit.     Motor: No abnormal muscle tone.     Coordination: Coordination normal.     Gait: Gait normal.     Deep Tendon Reflexes: Reflexes are normal and symmetric.  Psychiatric:        Behavior: Behavior normal.        Thought Content: Thought content normal.        Judgment: Judgment normal.   Wax B Bruit, mild  Lab Results  Component Value Date   WBC 6.5 02/01/2022   HGB 14.6 02/01/2022   HCT 44.0 02/01/2022   PLT 119.0 (L) 02/01/2022   GLUCOSE 117 (H) 02/01/2022   CHOL 126 02/01/2022   TRIG 54.0 02/01/2022   HDL 47.50 02/01/2022   LDLCALC 68 02/01/2022   ALT 21 02/01/2022   AST 26 02/01/2022   NA 140 02/01/2022   K 4.5 02/01/2022   CL 102 02/01/2022   CREATININE 1.00 02/01/2022   BUN 17 02/01/2022   CO2 30 02/01/2022   TSH 1.51 02/01/2022   PSA 0.42 02/01/2022   INR 1.1 RATIO (H) 05/16/2008   HGBA1C 6.5 02/01/2022    VAS US AORTA/IVC/ILIACS  Result Date: 05/22/2021 ABDOMINAL AORTA STUDY Patient Name:  Joshua Brown  Date of Exam:   05/21/2021 Medical Rec #: [id number]      Accession #:    [id number] Date of Birth: [birth year]       Patient Gender: M Patient Age:   89 years Exam Location:  [facility] Procedure:      VAS US AORTA/IVC/ILIACS Referring Phys: Legrand Como COOPER --------------------------------------------------------------------------------  Indications: Peripheral arterial disease, and patient denies any claudication  symptoms or rest pain.               Today's ABIs are .88 on the right and .90 on the left. Risk Factors: Hypertension, hyperlipidemia, past history of smoking, prior MI,               coronary artery disease. Limitations: Air/bowel gas.  Comparison Study: In 05/2020, a lower arterial duplex performed at                   [facility] showed velocities of                   309 cm/s in the distal aorta and 294 cm/s in the right                   proximal common iliac artery, both suggesting >50% stenosis.                   Highest velocities in the left common iliac artery was in the                   proximal segment at 171 cm/s, not consistent with prior exam                   of 347 cm/s on 05/21/2019. Performing Technologist: Sharlett Iles RVT  Examination Guidelines: A complete evaluation includes B-mode imaging, spectral Doppler, color Doppler, and power Doppler as needed of all accessible portions of each vessel. Bilateral testing is considered an integral part of a complete examination. Limited examinations for reoccurring indications may be performed as noted.  Abdominal Aorta Findings: +-------------+-------+----------+------+----------------+--------+------------+ Location     AP (cm)Trans (cm)PSV   Waveform        ThrombusComments                                   (cm/s)                                     +-------------+-------+----------+------+----------------+--------+------------+ Proximal     2.00   2.00      107   triphasic               dampened     +-------------+-------+----------+------+----------------+--------+------------+ Mid                            147   triphasic               dampened     +-------------+-------+----------+------+----------------+--------+------------+ Distal                        360   biphasic                turbulent    +-------------+-------+----------+------+----------------+--------+------------+ RT CIA Prox                   369   triphasic               turbulent    +-------------+-------+----------+------+----------------+--------+------------+ RT CIA Mid                    220   triphasic  turbulent    +-------------+-------+----------+------+----------------+--------+------------+ RT CIA Distal                 137   triphasic                            +-------------+-------+----------+------+----------------+--------+------------+ RT EIA Prox                   125   triphasic                            +-------------+-------+----------+------+----------------+--------+------------+ RT EIA Mid                    132   triphasic                            +-------------+-------+----------+------+----------------+--------+------------+ RT EIA Distal                 104   barely triphasic                     +-------------+-------+----------+------+----------------+--------+------------+ LT CIA Prox                   521   triphasic               turbulent,                                                               increase in                                                              velocities                                                               when                                                                     compared to                                                              exam on  05/21/2019   +-------------+-------+----------+------+----------------+--------+------------+ LT CIA Mid                     234   triphasic                            +-------------+-------+----------+------+----------------+--------+------------+ LT CIA Distal                 195   triphasic                            +-------------+-------+----------+------+----------------+--------+------------+ LT EIA Prox                   164   multiphasic                          +-------------+-------+----------+------+----------------+--------+------------+ LT EIA Mid                    102   barely triphasic                     +-------------+-------+----------+------+----------------+--------+------------+ LT EIA Distal                 119   barely triphasic                     +-------------+-------+----------+------+----------------+--------+------------+ IVC/Iliac Findings: +--------+------+--------+--------+   IVC   PatentThrombusComments +--------+------+--------+--------+ IVC Proxpatent                 +--------+------+--------+--------+    Summary: Abdominal Aorta: Aortoiliac atherosclerosis. The largest aortic measurement is 2.0 cm. Stenosis: +--------------------+-------------+-------------------------------------------+ Location            Stenosis     Comments                                    +--------------------+-------------+-------------------------------------------+ Distal Aorta        >50% stenosisstable velocities                           +--------------------+-------------+-------------------------------------------+ Right Common Iliac  >50% stenosisstable velocities                           +--------------------+-------------+-------------------------------------------+ Left Common Iliac   >50% stenosisincrease velocities at the ostium compared                                   to prior exam in 05/2019                    +--------------------+-------------+-------------------------------------------+ Right External Iliac<50% stenosis                                             +--------------------+-------------+-------------------------------------------+ Left External Iliac <50% stenosis                                            +--------------------+-------------+-------------------------------------------+  IVC/Iliac: Patent IVC.  *  See table(s) above for measurements and observations. See ABI report. Suggest follow up study in 12 months.  Electronically signed by Carlyle Dolly MD on 05/22/2021 at 4:23:43 PM.    Final    VAS [place] ABI WITH/WO TBI  Result Date: 05/22/2021  LOWER EXTREMITY DOPPLER STUDY Patient Name:  Joshua Brown  Date of Exam:   05/21/2021 Medical Rec #: [id number]      Accession #:    [id number] Date of Birth: 31-Jan-1946      Patient Gender: M Patient Age:   40 years Exam Location:  [facility] Procedure:      VAS [place] ABI WITH/WO TBI Referring Phys: MICHAEL COOPER --------------------------------------------------------------------------------  Indications: Peripheral artery disease, and and patient denies any claudication              symptoms or rest pain. High Risk Factors: Hypertension, hyperlipidemia, past history of smoking, prior                    MI, coronary artery disease.  Comparison Study: In 05/2020, a lower arterial Doppler showed an ABI of .95 on                   the right and .85 on the left. Performing Technologist: Sharlett Iles RVT  Examination Guidelines: A complete evaluation includes at minimum, Doppler waveform signals and systolic blood pressure reading at the level of bilateral brachial, anterior tibial, and posterior tibial arteries, when vessel segments are accessible. Bilateral testing is considered an integral part of a complete examination. Photoelectric Plethysmograph (PPG) waveforms and toe systolic pressure readings are included as required and additional duplex testing as needed. Limited examinations for reoccurring indications may be performed as noted.  ABI Findings:  +---------+------------------+-----+-----------+--------+ Right    Rt Pressure (mmHg)IndexWaveform   Comment  +---------+------------------+-----+-----------+--------+ Brachial 143                                        +---------+------------------+-----+-----------+--------+ PTA      126               0.88 triphasic           +---------+------------------+-----+-----------+--------+ PERO     120                    multiphasic.84      +---------+------------------+-----+-----------+--------+ DP       118               0.83 triphasic           +---------+------------------+-----+-----------+--------+ Great Toe103               0.72                     +---------+------------------+-----+-----------+--------+ +---------+------------------+-----+-----------+-------+ Left     Lt Pressure (mmHg)IndexWaveform   Comment +---------+------------------+-----+-----------+-------+ Brachial 143                                       +---------+------------------+-----+-----------+-------+ PTA      128               0.90 triphasic          +---------+------------------+-----+-----------+-------+ PERO     119  multiphasic.83     +---------+------------------+-----+-----------+-------+ DP       114               0.80 multiphasic        +---------+------------------+-----+-----------+-------+ Great Toe113               0.79                    +---------+------------------+-----+-----------+-------+ +-------+-----------+-----------+------------+------------+ ABI/TBIToday's ABIToday's TBIPrevious ABIPrevious TBI +-------+-----------+-----------+------------+------------+ Right  .88        .72        .95         .59          +-------+-----------+-----------+------------+------------+ Left   .90        .79        .85         .55          +-------+-----------+-----------+------------+------------+ Right ABIs appear decreased  compared to prior study on 05/21/2020. Left ABIs appear essentially unchanged compared to prior study on 05/21/2020. Bilateral TBIs appear increased compared to prior study on 05/21/2020.  Summary: Right: Resting right ankle-brachial index indicates mild right lower extremity arterial disease. The right toe-brachial index is normal. Left: Resting left ankle-brachial index indicates mild left lower extremity arterial disease. The left toe-brachial index is normal.  *See table(s) above for measurements and observations. See Aortoiliac duplex report.  Suggest follow up study in 12 months. Electronically signed by Carlyle Dolly MD on 05/22/2021 at 4:03:20 PM.    Final     Assessment & Plan:   Problem List Items Addressed This Visit       Digestive   Barrett's esophagus determined by biopsy    Will ref to Dr Henrene Pastor Last EGD 2019 - Dr Earlean Shawl On Protonix      Relevant Orders   Ambulatory referral to Gastroenterology     Nervous and Auditory   Cerumen impaction    Use Debrox kit        Genitourinary   Bladder neck obstruction    Check PSA        Other   Bruit    Check Doppler US      PMR (polymyalgia rheumatica) (HCC)    Off Prednisone      Well adult exam - Primary     We discussed age appropriate health related issues, including available/recomended screening tests and vaccinations. Labs were ordered to be later reviewed . All questions were answered. We discussed one or more of the following - seat belt use, use of sunscreen/sun exposure exercise, fall risk reduction, second hand smoke exposure, firearm use and storage, seat belt use, a need for adhering to healthy diet and exercise. Labs were ordered.  All questions were answered.           No orders of the defined types were placed in this encounter.     Follow-up: Return in about 3 months (around 11/02/2022) for a follow-up visit.  Walker Kehr, MD

## 2022-08-04 NOTE — Assessment & Plan Note (Signed)
Check Doppler US

## 2022-08-04 NOTE — Patient Instructions (Addendum)
Ginko biloba  Use Debrox kit

## 2022-08-31 ENCOUNTER — Telehealth: Payer: Self-pay | Admitting: Internal Medicine

## 2022-08-31 ENCOUNTER — Encounter: Payer: Self-pay | Admitting: Internal Medicine

## 2022-08-31 NOTE — Telephone Encounter (Signed)
You can schedule ear wax removal under nurse visit if MD told pt to come back to have ears cleaned. If just want clean will need to go on provider schedule.Marland KitchenJohny Brown

## 2022-08-31 NOTE — Telephone Encounter (Signed)
Patient is requesting a nurse visit for earwax removal. Due to that needing approval, he would like a call back to get that scheduled. Best callback is [phone].

## 2022-09-02 ENCOUNTER — Ambulatory Visit: Payer: Medicare PPO | Admitting: Internal Medicine

## 2022-09-02 VITALS — BP 128/80 | HR 64 | Temp 98.4°F | Ht 66.0 in | Wt 164.0 lb

## 2022-09-02 DIAGNOSIS — H6123 Impacted cerumen, bilateral: Secondary | ICD-10-CM

## 2022-09-02 DIAGNOSIS — I10 Essential (primary) hypertension: Secondary | ICD-10-CM

## 2022-09-02 DIAGNOSIS — R739 Hyperglycemia, unspecified: Secondary | ICD-10-CM

## 2022-09-02 NOTE — Progress Notes (Signed)
PRE-PROCEDURE EXAM: Left TM cannot be visualized due to total occlusion/impaction of the ear canal.  PROCEDURE INDICATION: remove wax to visualize ear drum & relieve discomfort  CONSENT:  Verbal     PROCEDURE NOTE:     BILATERAL EAR:  I used warm water irrigation under direct visualization with the otoscope to free the wax bolus from the ear canal.    POST- PROCEDURE EXAM: TMs successfully visualized and found to have no erythema     The patient tolerated the procedure well.

## 2022-09-02 NOTE — Patient Instructions (Signed)
Your ears were irrigated of wax today  Please continue all other medications as before, and refills have been done if requested.  Please have the pharmacy call with any other refills you may need.  Please keep your appointments with your specialists as you may have planned   

## 2022-09-02 NOTE — Progress Notes (Signed)
Patient ID: Joshua Brown, male   DOB: 10-30-1945, 77 y.o.   MRN: [id number]        Chief Complaint: follow up bilateral cerumen impaction, htn, hyperglycemia,        HPI:  Joshua Brown is a 77 y.o. male here with c/o bilateral ear muffled hearing without pain, fever, vertigo or headache.  Pt denies chest pain, increased sob or doe, wheezing, orthopnea, PND, increased LE swelling, palpitations, dizziness or syncope.   Pt denies polydipsia, polyuria, or new focal neuro s/s.    Pt denies recent wt loss, night sweats, loss of appetite, or other constitutional symptoms       Wt Readings from Last 3 Encounters:  09/02/22 164 lb (74.4 kg)  08/04/22 163 lb (73.9 kg)  02/01/22 160 lb (72.6 kg)   BP Readings from Last 3 Encounters:  09/02/22 128/80  08/04/22 122/72  02/01/22 124/72         Past Medical History:  Diagnosis Date   ABDOMINAL AORTIC ANEURYSM 11/12/2008   has annual U/s   Atypical mole 03/08/2018   mild atypia on R lower abdomen - no treatment   CAD (coronary artery disease)    premature   DDD (degenerative disc disease)    ED (erectile dysfunction)    Frequent urination at night    nocturia 2-3   GERD (gastroesophageal reflux disease)    Hyperglycemia    Borderline   Hyperlipidemia    Atherogenic   Hypertension    Myocardial infarct, old    Obesity    Class I   PVD (peripheral vascular disease) ([place])    ABI June '12 -0.9 bilateral.   Past Surgical History:  Procedure Laterality Date   colonoscopy     one month ago   CORONARY ARTERY BYPASS GRAFT     TONSILLECTOMY     age 77    reports that he quit smoking about 53 years ago. His smoking use included cigarettes. He has a 16.00 pack-year smoking history. He has never used smokeless tobacco. He reports current alcohol use of about 2.0 standard drinks of alcohol per week. He reports that he does not use drugs. family history includes CAD in his father and paternal grandfather; Cancer in his mother; Congestive Heart  Failure in his mother; Early death in his father; Heart attack in his father and paternal grandfather; Heart disease in his father and paternal grandfather; Hyperlipidemia in his mother; Prostate cancer in his brother. No Known Allergies Current Outpatient Medications on File Prior to Visit  Medication Sig Dispense Refill   ALPRAZolam (XANAX) 0.5 MG tablet TAKE 1 TABLET AT BEDTIME AS NEEDED FOR ANXIETY 30 tablet 2   atorvastatin (LIPITOR) 80 MG tablet TAKE 1 TABLET EVERY DAY. MUST KEEP UPCOMING APPOINTMENT IN FEBRUARY 2023 90 tablet 2   b complex vitamins capsule Take 1 capsule by mouth daily.       Cholecalciferol (VITAMIN D) 2000 UNIT CAPS Take 2,000 Units by mouth daily.      clobetasol cream (TEMOVATE) [id number] % Apply 1 application. topically 2 (two) times daily. 30 g 2   clopidogrel (PLAVIX) 75 MG tablet TAKE 1 TABLET EVERY DAY 90 tablet 3   ezetimibe (ZETIA) 10 MG tablet TAKE 1 TABLET EVERY DAY. MUST KEEP UPCOMING APPOINTMENT IN FEBRUARY 2023 90 tablet 2   glucosamine-chondroitin 500-400 MG tablet Take 1 tablet by mouth in the morning and at bedtime.     losartan (COZAAR) 25 MG tablet TAKE 1 TABLET EVERY DAY  90 tablet 3   metoprolol succinate (TOPROL-XL) 50 MG 24 hr tablet TAKE 1 TABLET BY MOUTH EVERY DAY WITH OR IMMEDIATELY AFTER A MEAL 90 tablet 3   naproxen (NAPROSYN) 500 MG tablet 1 tablet with food or milk as needed     niacin (NIASPAN) 500 MG CR tablet Take 2,000 mg by mouth at bedtime.     nitroGLYCERIN (NITROSTAT) 0.4 MG SL tablet PLACE 1 TABLET UNDER THE TONGUE EVERY 5 MINUTES AS NEEDED FOR CHEST PAIN. 25 tablet 3   pantoprazole (PROTONIX) 40 MG tablet TAKE 1 TABLET EVERY DAY. MUST KEEP UPCOMING APPOINTMENT IN FEBRUARY 2023 90 tablet 2   sildenafil (REVATIO) 20 MG tablet Take 2-5 tablets by mouth as needed prior to intercourse 90 tablet 1   triamcinolone cream (KENALOG) 0.1 % Apply 1 application topically as needed.     No current facility-administered medications on file prior to  visit.        ROS:  All others reviewed and negative.  Objective        PE:  BP 128/80 (BP Location: Right Arm, Patient Position: Sitting, Cuff Size: Large)   Pulse 64   Temp 98.4 F (36.9 C) (Oral)   Ht '5\' 6"'$  (1.676 m)   Wt 164 lb (74.4 kg)   SpO2 98%   BMI 26.47 kg/m                 Constitutional: Pt appears in NAD               HENT: Head: NCAT.                Right Ear: External ear normal.                 Left Ear: External ear normal. Bialteral cerumen impactions resolved with irrigation and hearing improved               Eyes: . Pupils are equal, round, and reactive to light. Conjunctivae and EOM are normal               Nose: without d/c or deformity               Neck: Neck supple. Gross normal ROM               Cardiovascular: Normal rate and regular rhythm.                 Pulmonary/Chest: Effort normal and breath sounds without rales or wheezing.                               Neurological: Pt is alert. At baseline orientation, motor grossly intact               Skin: Skin is warm. No rashes, no other new lesions, LE edema - none               Psychiatric: Pt behavior is normal without agitation   Micro: none  Cardiac tracings I have personally interpreted today:  none  Pertinent Radiological findings (summarize): none   Lab Results  Component Value Date   WBC 6.9 08/04/2022   HGB 14.8 08/04/2022   HCT 44.2 08/04/2022   PLT 139.0 (L) 08/04/2022   GLUCOSE 132 (H) 08/04/2022   CHOL 117 08/04/2022   TRIG 66.0 08/04/2022   HDL 43.40 08/04/2022   LDLCALC 60 08/04/2022   ALT 21 08/04/2022  AST 26 08/04/2022   NA 140 08/04/2022   K 4.3 08/04/2022   CL 103 08/04/2022   CREATININE 1.18 08/04/2022   BUN 20 08/04/2022   CO2 31 08/04/2022   TSH 1.60 08/04/2022   PSA 0.54 08/04/2022   INR 1.1 RATIO (H) 05/16/2008   HGBA1C 6.3 08/04/2022   Assessment/Plan:  Joshua Brown is a 77 y.o. White or Caucasian [1] male with  has a past medical history of ABDOMINAL  AORTIC ANEURYSM (11/12/2008), Atypical mole (03/08/2018), CAD (coronary artery disease), DDD (degenerative disc disease), ED (erectile dysfunction), Frequent urination at night, GERD (gastroesophageal reflux disease), Hyperglycemia, Hyperlipidemia, Hypertension, Myocardial infarct, old, Obesity, and PVD (peripheral vascular disease) ([place]).  Essential hypertension BP Readings from Last 3 Encounters:  09/02/22 128/80  08/04/22 122/72  02/01/22 124/72   Stable, pt to continue medical treatment lsoartan 25 mg qd, toprol xl 50 mg qd   Hyperglycemia Lab Results  Component Value Date   HGBA1C 6.3 08/04/2022   Stable, pt to continue current medical treatment   diet, wt control   Cerumen impaction Ceruminosis is noted.  Wax is removed by syringing and manual debridement. Instructions for home care to prevent wax buildup are given.  Followup: Return if symptoms worsen or fail to improve.  Cathlean Cower, MD 09/05/2022 11:47 AM [facility]

## 2022-09-03 ENCOUNTER — Ambulatory Visit
Admission: RE | Admit: 2022-09-03 | Discharge: 2022-09-03 | Disposition: A | Discharge status: Home / Self Care | Payer: Medicare PPO | Source: Ambulatory Visit | Attending: Internal Medicine | Admitting: Internal Medicine

## 2022-09-03 DIAGNOSIS — R918 Other nonspecific abnormal finding of lung field: Secondary | ICD-10-CM | POA: Diagnosis not present

## 2022-09-03 DIAGNOSIS — R911 Solitary pulmonary nodule: Secondary | ICD-10-CM | POA: Diagnosis not present

## 2022-09-03 DIAGNOSIS — R053 Chronic cough: Secondary | ICD-10-CM | POA: Diagnosis not present

## 2022-09-05 ENCOUNTER — Encounter: Payer: Self-pay | Admitting: Internal Medicine

## 2022-09-05 NOTE — Assessment & Plan Note (Signed)
BP Readings from Last 3 Encounters:  09/02/22 128/80  08/04/22 122/72  02/01/22 124/72   Stable, pt to continue medical treatment lsoartan 25 mg qd, toprol xl 50 mg qd

## 2022-09-05 NOTE — Assessment & Plan Note (Signed)
Lab Results  Component Value Date   HGBA1C 6.3 08/04/2022   Stable, pt to continue current medical treatment   diet, wt control

## 2022-09-05 NOTE — Assessment & Plan Note (Signed)
Ceruminosis is noted.  Wax is removed by syringing and manual debridement. Instructions for home care to prevent wax buildup are given.  

## 2022-09-14 ENCOUNTER — Telehealth: Payer: Self-pay | Admitting: Internal Medicine

## 2022-09-14 NOTE — Telephone Encounter (Signed)
Hi Dr. Henrene Pastor,  We received a referral for the patient to be evaluated for Barrett's esophagus determined by biopsy. He does have history with Dr. Earlean Shawl from 2019. All records were obtained which also includes a colonoscopy that was performed. The patient is specifically requesting to continue his care with you. Please review and advise on scheduling.    Thank you.

## 2022-09-16 ENCOUNTER — Other Ambulatory Visit: Payer: Self-pay | Admitting: Internal Medicine

## 2022-09-18 NOTE — Telephone Encounter (Signed)
COURTESY GI RECORDS REVIEW  Outside records reviewed. [place] for routine office visit with me.  Docia Chuck. Geri [place]in[place]., M.D. [facility] Division of Gastroenterology

## 2022-09-21 ENCOUNTER — Encounter: Payer: Self-pay | Admitting: Internal Medicine

## 2022-09-21 NOTE — Telephone Encounter (Signed)
Patient scheduled for 5/22  

## 2022-09-21 NOTE — Telephone Encounter (Signed)
Called patient to schedule left voicemail. 

## 2022-11-02 ENCOUNTER — Ambulatory Visit: Payer: Medicare PPO | Admitting: Internal Medicine

## 2022-11-02 ENCOUNTER — Encounter: Payer: Self-pay | Admitting: Internal Medicine

## 2022-11-02 VITALS — BP 120/68 | HR 58 | Temp 98.0°F | Ht 66.0 in | Wt 164.0 lb

## 2022-11-02 DIAGNOSIS — M353 Polymyalgia rheumatica: Secondary | ICD-10-CM

## 2022-11-02 DIAGNOSIS — E785 Hyperlipidemia, unspecified: Secondary | ICD-10-CM | POA: Diagnosis not present

## 2022-11-02 DIAGNOSIS — R944 Abnormal results of kidney function studies: Secondary | ICD-10-CM | POA: Diagnosis not present

## 2022-11-02 DIAGNOSIS — D485 Neoplasm of uncertain behavior of skin: Secondary | ICD-10-CM

## 2022-11-02 DIAGNOSIS — R739 Hyperglycemia, unspecified: Secondary | ICD-10-CM | POA: Diagnosis not present

## 2022-11-02 DIAGNOSIS — I10 Essential (primary) hypertension: Secondary | ICD-10-CM

## 2022-11-02 LAB — HEMOGLOBIN A1C: Hgb A1c MFr Bld: 6.4 % (ref 4.6–6.5)

## 2022-11-02 LAB — COMPREHENSIVE METABOLIC PANEL
ALT: 21 U/L (ref 0–53)
AST: 28 U/L (ref 0–37)
Albumin: 4.3 g/dL (ref 3.5–5.2)
Alkaline Phosphatase: 72 U/L (ref 39–117)
BUN: 12 mg/dL (ref 6–23)
CO2: 29 mEq/L (ref 19–32)
Calcium: 9.5 mg/dL (ref 8.4–10.5)
Chloride: 104 mEq/L (ref 96–112)
Creatinine, Ser: 1.19 mg/dL (ref 0.40–1.50)
GFR: 58.7 mL/min — ABNORMAL LOW (ref >60.00)
Glucose, Bld: 121 mg/dL — ABNORMAL HIGH (ref 70–99)
Potassium: 4.6 mEq/L (ref 3.5–5.1)
Sodium: 139 mEq/L (ref 135–145)
Total Bilirubin: 0.6 mg/dL (ref 0.2–1.2)
Total Protein: 6.9 g/dL (ref 6.0–8.3)

## 2022-11-02 NOTE — Progress Notes (Signed)
Subjective:  Patient ID: Joshua Brown, male    DOB: 1946/03/11  Age: 77 y.o. MRN: [id number]  CC: Follow-up (3 MNTH F/U)   HPI UZZIEL RUSSEY presents for HTN, anxiety, h/o PMR, rash, moles  Outpatient Medications Prior to Visit  Medication Sig Dispense Refill   ALPRAZolam (XANAX) 0.5 MG tablet TAKE 1 TABLET AT BEDTIME AS NEEDED FOR ANXIETY 30 tablet 3   atorvastatin (LIPITOR) 80 MG tablet TAKE 1 TABLET EVERY DAY. MUST KEEP UPCOMING APPOINTMENT IN FEBRUARY 2023 90 tablet 2   b complex vitamins capsule Take 1 capsule by mouth daily.       Cholecalciferol (VITAMIN D) 2000 UNIT CAPS Take 2,000 Units by mouth daily.      clobetasol cream (TEMOVATE) 0.05 % Apply 1 application. topically 2 (two) times daily. 30 g 2   clopidogrel (PLAVIX) 75 MG tablet TAKE 1 TABLET EVERY DAY 90 tablet 3   ezetimibe (ZETIA) 10 MG tablet TAKE 1 TABLET EVERY DAY. MUST KEEP UPCOMING APPOINTMENT IN FEBRUARY 2023 90 tablet 2   losartan (COZAAR) 25 MG tablet TAKE 1 TABLET EVERY DAY 90 tablet 3   metoprolol succinate (TOPROL-XL) 50 MG 24 hr tablet TAKE 1 TABLET BY MOUTH EVERY DAY WITH OR IMMEDIATELY AFTER A MEAL 90 tablet 3   naproxen (NAPROSYN) 500 MG tablet 1 tablet with food or milk as needed     niacin (NIASPAN) 500 MG CR tablet Take 2,000 mg by mouth at bedtime.     nitroGLYCERIN (NITROSTAT) 0.4 MG SL tablet PLACE 1 TABLET UNDER THE TONGUE EVERY 5 MINUTES AS NEEDED FOR CHEST PAIN. 25 tablet 3   pantoprazole (PROTONIX) 40 MG tablet TAKE 1 TABLET EVERY DAY. MUST KEEP UPCOMING APPOINTMENT IN FEBRUARY 2023 90 tablet 2   sildenafil (REVATIO) 20 MG tablet Take 2-5 tablets by mouth as needed prior to intercourse 90 tablet 1   triamcinolone cream (KENALOG) 0.1 % Apply 1 application topically as needed.     glucosamine-chondroitin 500-400 MG tablet Take 1 tablet by mouth in the morning and at bedtime.     No facility-administered medications prior to visit.    ROS: Review of Systems  Constitutional:  Negative for  appetite change, fatigue and unexpected weight change.  HENT:  Negative for congestion, nosebleeds, sneezing, sore throat and trouble swallowing.   Eyes:  Negative for itching and visual disturbance.  Respiratory:  Negative for cough.   Cardiovascular:  Negative for chest pain, palpitations and leg swelling.  Gastrointestinal:  Negative for abdominal distention, blood in stool, diarrhea and nausea.  Genitourinary:  Negative for frequency and hematuria.  Musculoskeletal:  Negative for back pain, gait problem, joint swelling and neck pain.  Skin:  Negative for rash.  Neurological:  Negative for dizziness, tremors, speech difficulty and weakness.  Psychiatric/Behavioral:  Negative for agitation, dysphoric mood and sleep disturbance. The patient is not nervous/anxious.     Objective:  BP 120/68 (BP Location: Left Arm, Patient Position: Sitting, Cuff Size: Large)   Pulse (!) 58   Temp 98 F (36.7 C) (Oral)   Ht 5\' 6"  (1.676 m)   Wt 164 lb (74.4 kg)   SpO2 97%   BMI 26.47 kg/m   BP Readings from Last 3 Encounters:  11/02/22 120/68  09/02/22 128/80  08/04/22 122/72    Wt Readings from Last 3 Encounters:  11/02/22 164 lb (74.4 kg)  09/02/22 164 lb (74.4 kg)  08/04/22 163 lb (73.9 kg)    Physical Exam Constitutional:  General: He is not in acute distress.    Appearance: Normal appearance. He is well-developed.     Comments: NAD  Eyes:     Conjunctiva/sclera: Conjunctivae normal.     Pupils: Pupils are equal, round, and reactive to light.  Neck:     Thyroid: No thyromegaly.     Vascular: No JVD.  Cardiovascular:     Rate and Rhythm: Normal rate and regular rhythm.     Heart sounds: Normal heart sounds. No murmur heard.    No friction rub. No gallop.  Pulmonary:     Effort: Pulmonary effort is normal. No respiratory distress.     Breath sounds: Normal breath sounds. No wheezing or rales.  Chest:     Chest wall: No tenderness.  Abdominal:     General: Bowel sounds are  normal. There is no distension.     Palpations: Abdomen is soft. There is no mass.     Tenderness: There is no abdominal tenderness. There is no guarding or rebound.  Musculoskeletal:        General: No tenderness. Normal range of motion.     Cervical back: Normal range of motion.  Lymphadenopathy:     Cervical: No cervical adenopathy.  Skin:    General: Skin is warm and dry.     Findings: No rash.  Neurological:     Mental Status: He is alert and oriented to person, place, and time.     Cranial Nerves: No cranial nerve deficit.     Motor: No abnormal muscle tone.     Coordination: Coordination normal.     Gait: Gait normal.     Deep Tendon Reflexes: Reflexes are normal and symmetric.  Psychiatric:        Behavior: Behavior normal.        Thought Content: Thought content normal.        Judgment: Judgment normal.   Mole on scalp   Lab Results  Component Value Date   WBC 6.9 08/04/2022   HGB 14.8 08/04/2022   HCT 44.2 08/04/2022   PLT 139.0 (L) 08/04/2022   GLUCOSE 132 (H) 08/04/2022   CHOL 117 08/04/2022   TRIG 66.0 08/04/2022   HDL 43.40 08/04/2022   LDLCALC 60 08/04/2022   ALT 21 08/04/2022   AST 26 08/04/2022   NA 140 08/04/2022   K 4.3 08/04/2022   CL 103 08/04/2022   CREATININE 1.18 08/04/2022   BUN 20 08/04/2022   CO2 31 08/04/2022   TSH 1.60 08/04/2022   PSA 0.54 08/04/2022   INR 1.1 RATIO (H) 05/16/2008   HGBA1C 6.3 08/04/2022    CT Chest Wo Contrast  Result Date: 09/05/2022 CLINICAL DATA:  Chronic cough EXAM: CT CHEST WITHOUT CONTRAST TECHNIQUE: Multidetector CT imaging of the chest was performed following the standard protocol without IV contrast. RADIATION DOSE REDUCTION: This exam was performed according to the departmental dose-optimization program which includes automated exposure control, adjustment of the mA and/or kV according to patient size and/or use of iterative reconstruction technique. COMPARISON:  Chest x-ray 12/26/2014 FINDINGS:  Cardiovascular: Normal heart size status post sternotomy and CABG. No pericardial effusion. Thoracic aorta is nonaneurysmal. Atherosclerotic calcifications of the aorta and coronary arteries. Central pulmonary vasculature is nondilated. Mediastinum/Nodes: No enlarged mediastinal or axillary lymph nodes. Thyroid gland and trachea esophagus demonstrate no significant findings. Small hiatal hernia. Mildly thickened appearance of the distal esophagus. Lungs/Pleura: There are several scattered noncalcified pulmonary nodules throughout both lungs, largest within the medial aspect of the  left upper lobe measuring 5 mm (series 8, image 24). Largest on the right measures up to 3.5 mm within the right lower lobe (series 8, image 104). Lungs are otherwise clear. No pleural effusion or pneumothorax. Upper Abdomen: No acute abnormality. Musculoskeletal: No chest wall mass or suspicious bone lesions identified. Multilevel thoracic spondylosis with increased kyphosis. IMPRESSION: 1. No acute intrathoracic findings. 2. Several scattered noncalcified pulmonary nodules throughout both lungs, largest within the left upper lobe measuring 5 mm. No follow-up needed if patient is low-risk (and has no known or suspected primary neoplasm). Non-contrast chest CT can be considered in 12 months if patient is high-risk. This recommendation follows the consensus statement: Guidelines for Management of Incidental Pulmonary Nodules Detected on CT Images: From the Fleischner Society 2017; Radiology 2017; 284:228-243. 3. Small hiatal hernia with mildly thickened appearance of the distal esophagus, which can be seen in the setting of esophagitis. 4. Aortic and coronary artery atherosclerosis (ICD10-I70.0). Electronically Signed   By: Duanne Guess D.O.   On: 09/05/2022 11:45    Assessment & Plan:   Problem List Items Addressed This Visit     Dyslipidemia    Cont on Zetia, Lipitor, Niacin      Essential hypertension    Cont on Losartan,  Toprol      Hyperglycemia    Check A1c      Decreased GFR - Primary    Monitor GFR      Neoplasm of uncertain behavior of skin    Scalp - skin bx w/me Derm ref      Relevant Orders   Ambulatory referral to Dermatology      No orders of the defined types were placed in this encounter.     Follow-up: Return in about 6 months (around 05/04/2023) for a follow-up visit.  Sonda Primes, MD

## 2022-11-02 NOTE — Assessment & Plan Note (Signed)
Monitor ESR 

## 2022-11-02 NOTE — Assessment & Plan Note (Signed)
Cont on Losartan, Toprol °

## 2022-11-02 NOTE — Assessment & Plan Note (Addendum)
Scalp - skin bx w/me Derm ref

## 2022-11-02 NOTE — Assessment & Plan Note (Signed)
Check A1c. 

## 2022-11-02 NOTE — Assessment & Plan Note (Signed)
Monitor GFR 

## 2022-11-02 NOTE — Assessment & Plan Note (Signed)
Cont on Zetia, Lipitor, Niacin

## 2022-11-03 ENCOUNTER — Telehealth: Payer: Self-pay | Admitting: Internal Medicine

## 2022-11-03 NOTE — Telephone Encounter (Signed)
Patient called and wants to be started on the farxiga

## 2022-11-05 ENCOUNTER — Other Ambulatory Visit (HOSPITAL_COMMUNITY)
Admission: RE | Admit: 2022-11-05 | Discharge: 2022-11-05 | Disposition: A | Discharge status: Home / Self Care | Payer: Medicare PPO | Source: Ambulatory Visit | Attending: Internal Medicine | Admitting: Internal Medicine

## 2022-11-05 ENCOUNTER — Encounter: Payer: Self-pay | Admitting: Internal Medicine

## 2022-11-05 ENCOUNTER — Ambulatory Visit: Payer: Medicare PPO | Admitting: Internal Medicine

## 2022-11-05 VITALS — Ht 66.0 in

## 2022-11-05 DIAGNOSIS — R944 Abnormal results of kidney function studies: Secondary | ICD-10-CM

## 2022-11-05 DIAGNOSIS — E119 Type 2 diabetes mellitus without complications: Secondary | ICD-10-CM | POA: Diagnosis not present

## 2022-11-05 DIAGNOSIS — D485 Neoplasm of uncertain behavior of skin: Secondary | ICD-10-CM | POA: Insufficient documentation

## 2022-11-05 DIAGNOSIS — L821 Other seborrheic keratosis: Secondary | ICD-10-CM

## 2022-11-05 MED ORDER — DAPAGLIFLOZIN PROPANEDIOL 5 MG PO TABS: 5.0000 mg | ORAL_TABLET | Freq: Every day | ORAL | 11 refills | Status: DC

## 2022-11-05 NOTE — Assessment & Plan Note (Signed)
See bx procedure 

## 2022-11-05 NOTE — Assessment & Plan Note (Signed)
Discussed renal and CV protection options Start Farxiga 

## 2022-11-05 NOTE — Progress Notes (Signed)
Subjective:  Patient ID: Joshua Brown, male    DOB: 1945-12-10  Age: 77 y.o. MRN: [id number]  CC: No chief complaint on file.   HPI Joshua Brown presents for decreased GFR and for a skin   Outpatient Medications Prior to Visit  Medication Sig Dispense Refill   ALPRAZolam (XANAX) 0.5 MG tablet TAKE 1 TABLET AT BEDTIME AS NEEDED FOR ANXIETY 30 tablet 3   atorvastatin (LIPITOR) 80 MG tablet TAKE 1 TABLET EVERY DAY. MUST KEEP UPCOMING APPOINTMENT IN FEBRUARY 2023 90 tablet 2   b complex vitamins capsule Take 1 capsule by mouth daily.       Cholecalciferol (VITAMIN D) 2000 UNIT CAPS Take 2,000 Units by mouth daily.      clobetasol cream (TEMOVATE) 0.05 % Apply 1 application. topically 2 (two) times daily. 30 g 2   clopidogrel (PLAVIX) 75 MG tablet TAKE 1 TABLET EVERY DAY 90 tablet 3   ezetimibe (ZETIA) 10 MG tablet TAKE 1 TABLET EVERY DAY. MUST KEEP UPCOMING APPOINTMENT IN FEBRUARY 2023 90 tablet 2   losartan (COZAAR) 25 MG tablet TAKE 1 TABLET EVERY DAY 90 tablet 3   metoprolol succinate (TOPROL-XL) 50 MG 24 hr tablet TAKE 1 TABLET BY MOUTH EVERY DAY WITH OR IMMEDIATELY AFTER A MEAL 90 tablet 3   naproxen (NAPROSYN) 500 MG tablet 1 tablet with food or milk as needed     niacin (NIASPAN) 500 MG CR tablet Take 2,000 mg by mouth at bedtime.     nitroGLYCERIN (NITROSTAT) 0.4 MG SL tablet PLACE 1 TABLET UNDER THE TONGUE EVERY 5 MINUTES AS NEEDED FOR CHEST PAIN. 25 tablet 3   pantoprazole (PROTONIX) 40 MG tablet TAKE 1 TABLET EVERY DAY. MUST KEEP UPCOMING APPOINTMENT IN FEBRUARY 2023 90 tablet 2   sildenafil (REVATIO) 20 MG tablet Take 2-5 tablets by mouth as needed prior to intercourse 90 tablet 1   No facility-administered medications prior to visit.    ROS: Review of Systems  Constitutional:  Negative for chills.  Cardiovascular:  Negative for chest pain.  Skin:  Positive for color change. Negative for wound.    Objective:  Ht 5\' 6"  (1.676 m)   BMI 26.47 kg/m   BP Readings from  Last 3 Encounters:  11/02/22 120/68  09/02/22 128/80  08/04/22 122/72    Wt Readings from Last 3 Encounters:  11/02/22 164 lb (74.4 kg)  09/02/22 164 lb (74.4 kg)  08/04/22 163 lb (73.9 kg)    Physical Exam Constitutional:      General: He is not in acute distress.    Appearance: Normal appearance. He is well-developed.     Comments: NAD  Eyes:     Conjunctiva/sclera: Conjunctivae normal.     Pupils: Pupils are equal, round, and reactive to light.  Neck:     Thyroid: No thyromegaly.     Vascular: No JVD.  Cardiovascular:     Rate and Rhythm: Normal rate and regular rhythm.     Heart sounds: Normal heart sounds.  Pulmonary:     Effort: Pulmonary effort is normal.     Breath sounds: Normal breath sounds.  Abdominal:     General: Bowel sounds are normal.     Palpations: Abdomen is soft.  Musculoskeletal:        General: No swelling. Normal range of motion.     Cervical back: Normal range of motion.  Skin:    General: Skin is warm and dry.     Findings: Lesion present.  Neurological:     Mental Status: He is alert and oriented to person, place, and time.     Motor: No weakness or abnormal muscle tone.     Gait: Gait normal.     Deep Tendon Reflexes: Reflexes are normal and symmetric.  Psychiatric:        Behavior: Behavior normal.        Thought Content: Thought content normal.        Judgment: Judgment normal.      Procedure - Excisional biopsy: scalp Indication: r/o skin cancer Risks including bleeding on a blood thinner, infection, incomplete removal and other were discussed. Consent was signed.  Lesion #1 on   measuring approx 25x24   mm irregular - see picture   Skin over lesion #1  was prepped with Betadine and alcohol  and anesthetized with 1 cc of 2% lidocaine and epinephrine, using a 25-gauge 1 inch needle. Excisional biopsy with a 6 mm punch. Closed with 3 staples. Hemostasis after 5 min of pressure. Band-Aid was applied with antibiotic  ointment.  Tolerated well. Complications - none     Lab Results  Component Value Date   WBC 6.9 08/04/2022   HGB 14.8 08/04/2022   HCT 44.2 08/04/2022   PLT 139.0 (L) 08/04/2022   GLUCOSE 121 (H) 11/02/2022   CHOL 117 08/04/2022   TRIG 66.0 08/04/2022   HDL 43.40 08/04/2022   LDLCALC 60 08/04/2022   ALT 21 11/02/2022   AST 28 11/02/2022   NA 139 11/02/2022   K 4.6 11/02/2022   CL 104 11/02/2022   CREATININE 1.19 11/02/2022   BUN 12 11/02/2022   CO2 29 11/02/2022   TSH 1.60 08/04/2022   PSA 0.54 08/04/2022   INR 1.1 RATIO (H) 05/16/2008   HGBA1C 6.4 11/02/2022    CT Chest Wo Contrast  Result Date: 09/05/2022 CLINICAL DATA:  Chronic cough EXAM: CT CHEST WITHOUT CONTRAST TECHNIQUE: Multidetector CT imaging of the chest was performed following the standard protocol without IV contrast. RADIATION DOSE REDUCTION: This exam was performed according to the departmental dose-optimization program which includes automated exposure control, adjustment of the mA and/or kV according to patient size and/or use of iterative reconstruction technique. COMPARISON:  Chest x-ray 12/26/2014 FINDINGS: Cardiovascular: Normal heart size status post sternotomy and CABG. No pericardial effusion. Thoracic aorta is nonaneurysmal. Atherosclerotic calcifications of the aorta and coronary arteries. Central pulmonary vasculature is nondilated. Mediastinum/Nodes: No enlarged mediastinal or axillary lymph nodes. Thyroid gland and trachea esophagus demonstrate no significant findings. Small hiatal hernia. Mildly thickened appearance of the distal esophagus. Lungs/Pleura: There are several scattered noncalcified pulmonary nodules throughout both lungs, largest within the medial aspect of the left upper lobe measuring 5 mm (series 8, image 24). Largest on the right measures up to 3.5 mm within the right lower lobe (series 8, image 104). Lungs are otherwise clear. No pleural effusion or pneumothorax. Upper Abdomen: No  acute abnormality. Musculoskeletal: No chest wall mass or suspicious bone lesions identified. Multilevel thoracic spondylosis with increased kyphosis. IMPRESSION: 1. No acute intrathoracic findings. 2. Several scattered noncalcified pulmonary nodules throughout both lungs, largest within the left upper lobe measuring 5 mm. No follow-up needed if patient is low-risk (and has no known or suspected primary neoplasm). Non-contrast chest CT can be considered in 12 months if patient is high-risk. This recommendation follows the consensus statement: Guidelines for Management of Incidental Pulmonary Nodules Detected on CT Images: From the Fleischner Society 2017; Radiology 2017; 284:228-243. 3. Small hiatal hernia with mildly  thickened appearance of the distal esophagus, which can be seen in the setting of esophagitis. 4. Aortic and coronary artery atherosclerosis (ICD10-I70.0). Electronically Signed   By: Duanne Guess D.O.   On: 09/05/2022 11:45    Assessment & Plan:   Problem List Items Addressed This Visit     Decreased GFR    Discussed renal and CV protection options Start Farxiga      Neoplasm of uncertain behavior of skin    See bx procedure      Relevant Orders   Surgical pathology   Diabetes mellitus type 2 in nonobese [facility]) - Primary    Discussed renal and CV protection options Start Farxiga      Relevant Medications   dapagliflozin propanediol (FARXIGA) 5 MG TABS tablet      Meds ordered this encounter  Medications   dapagliflozin propanediol (FARXIGA) 5 MG TABS tablet    Sig: Take 1 tablet (5 mg total) by mouth daily before breakfast.    Dispense:  30 tablet    Refill:  11      Follow-up: Return in about 10 days (around 11/15/2022).  Sonda Primes, MD

## 2022-11-05 NOTE — Assessment & Plan Note (Addendum)
Discussed renal and CV protection options Start [phone]

## 2022-11-08 NOTE — Telephone Encounter (Signed)
It has been taking care of.  Thanks

## 2022-11-09 LAB — SURGICAL PATHOLOGY

## 2022-11-10 DIAGNOSIS — H25013 Cortical age-related cataract, bilateral: Secondary | ICD-10-CM | POA: Diagnosis not present

## 2022-11-10 DIAGNOSIS — H5211 Myopia, right eye: Secondary | ICD-10-CM | POA: Diagnosis not present

## 2022-11-16 ENCOUNTER — Ambulatory Visit: Payer: Medicare PPO | Admitting: Internal Medicine

## 2022-11-16 ENCOUNTER — Encounter: Payer: Self-pay | Admitting: Internal Medicine

## 2022-11-16 VITALS — BP 120/70 | HR 58 | Temp 98.0°F | Ht 66.0 in | Wt 164.0 lb

## 2022-11-16 DIAGNOSIS — Z7984 Long term (current) use of oral hypoglycemic drugs: Secondary | ICD-10-CM | POA: Diagnosis not present

## 2022-11-16 DIAGNOSIS — E119 Type 2 diabetes mellitus without complications: Secondary | ICD-10-CM | POA: Diagnosis not present

## 2022-11-16 DIAGNOSIS — D485 Neoplasm of uncertain behavior of skin: Secondary | ICD-10-CM

## 2022-11-16 NOTE — Progress Notes (Signed)
Subjective:  Patient ID: Joshua Brown, male    DOB: 11-26-1945  Age: 77 y.o. MRN: [id number]  CC: Follow-up (BX F/U)   HPI Joshua Brown presents for DM f/u - not on Farxiga yet  Staples removal  Outpatient Medications Prior to Visit  Medication Sig Dispense Refill   ALPRAZolam (XANAX) 0.5 MG tablet TAKE 1 TABLET AT BEDTIME AS NEEDED FOR ANXIETY 30 tablet 3   atorvastatin (LIPITOR) 80 MG tablet TAKE 1 TABLET EVERY DAY. MUST KEEP UPCOMING APPOINTMENT IN FEBRUARY 2023 90 tablet 2   b complex vitamins capsule Take 1 capsule by mouth daily.       Cholecalciferol (VITAMIN D) 2000 UNIT CAPS Take 2,000 Units by mouth daily.      clobetasol cream (TEMOVATE) 0.05 % Apply 1 application. topically 2 (two) times daily. 30 g 2   clopidogrel (PLAVIX) 75 MG tablet TAKE 1 TABLET EVERY DAY 90 tablet 3   dapagliflozin propanediol (FARXIGA) 5 MG TABS tablet Take 1 tablet (5 mg total) by mouth daily before breakfast. 30 tablet 11   ezetimibe (ZETIA) 10 MG tablet TAKE 1 TABLET EVERY DAY. MUST KEEP UPCOMING APPOINTMENT IN FEBRUARY 2023 90 tablet 2   losartan (COZAAR) 25 MG tablet TAKE 1 TABLET EVERY DAY 90 tablet 3   metoprolol succinate (TOPROL-XL) 50 MG 24 hr tablet TAKE 1 TABLET BY MOUTH EVERY DAY WITH OR IMMEDIATELY AFTER A MEAL 90 tablet 3   naproxen (NAPROSYN) 500 MG tablet 1 tablet with food or milk as needed     niacin (NIASPAN) 500 MG CR tablet Take 2,000 mg by mouth at bedtime.     nitroGLYCERIN (NITROSTAT) 0.4 MG SL tablet PLACE 1 TABLET UNDER THE TONGUE EVERY 5 MINUTES AS NEEDED FOR CHEST PAIN. 25 tablet 3   pantoprazole (PROTONIX) 40 MG tablet TAKE 1 TABLET EVERY DAY. MUST KEEP UPCOMING APPOINTMENT IN FEBRUARY 2023 90 tablet 2   sildenafil (REVATIO) 20 MG tablet Take 2-5 tablets by mouth as needed prior to intercourse 90 tablet 1   No facility-administered medications prior to visit.    ROS: Review of Systems  Constitutional:  Negative for fever.  Musculoskeletal:  Negative for gait  problem.  Skin:  Positive for wound.    Objective:  BP 120/70 (BP Location: Right Arm, Patient Position: Sitting, Cuff Size: Large)   Pulse (!) 58   Temp 98 F (36.7 C) (Oral)   Ht 5\' 6"  (1.676 m)   Wt 164 lb (74.4 kg)   SpO2 97%   BMI 26.47 kg/m   BP Readings from Last 3 Encounters:  11/16/22 120/70  11/02/22 120/68  09/02/22 128/80    Wt Readings from Last 3 Encounters:  11/16/22 164 lb (74.4 kg)  11/02/22 164 lb (74.4 kg)  09/02/22 164 lb (74.4 kg)    Physical Exam Constitutional:      Appearance: Normal appearance.  Skin:    Findings: Lesion present. No erythema or rash.  Neurological:     Mental Status: He is oriented to person, place, and time.    He is here is for staples removal - removed x3  Lab Results  Component Value Date   WBC 6.9 08/04/2022   HGB 14.8 08/04/2022   HCT 44.2 08/04/2022   PLT 139.0 (L) 08/04/2022   GLUCOSE 121 (H) 11/02/2022   CHOL 117 08/04/2022   TRIG 66.0 08/04/2022   HDL 43.40 08/04/2022   LDLCALC 60 08/04/2022   ALT 21 11/02/2022   AST 28 11/02/2022  NA 139 11/02/2022   K 4.6 11/02/2022   CL 104 11/02/2022   CREATININE 1.19 11/02/2022   BUN 12 11/02/2022   CO2 29 11/02/2022   TSH 1.60 08/04/2022   PSA 0.54 08/04/2022   INR 1.1 RATIO (H) 05/16/2008   HGBA1C 6.4 11/02/2022    No results found.  Assessment & Plan:   Problem List Items Addressed This Visit     Neoplasm of uncertain behavior of skin    He is here is for staples removal Derm ref - Dr Emily Filbert      Relevant Orders   Ambulatory referral to Dermatology   Diabetes mellitus type 2 in nonobese [facility]) - Primary    Start on Farxiga Rx      Relevant Orders   Comprehensive metabolic panel   Hemoglobin A1c      No orders of the defined types were placed in this encounter.     Follow-up: Return in about 3 months (around 02/16/2023) for a follow-up visit.  Sonda Primes, MD

## 2022-11-16 NOTE — Assessment & Plan Note (Addendum)
He is here is for staples removal Derm ref - Dr Emily Filbert

## 2022-11-16 NOTE — Assessment & Plan Note (Addendum)
Start on [place] [facility]

## 2022-11-24 ENCOUNTER — Encounter: Payer: Self-pay | Admitting: Internal Medicine

## 2022-11-24 ENCOUNTER — Ambulatory Visit: Payer: Medicare PPO | Admitting: Internal Medicine

## 2022-11-24 VITALS — BP 112/74 | HR 67 | Ht 66.0 in | Wt 164.0 lb

## 2022-11-24 DIAGNOSIS — K2270 Barrett's esophagus without dysplasia: Secondary | ICD-10-CM | POA: Diagnosis not present

## 2022-11-24 DIAGNOSIS — Z860100 Personal history of colon polyps, unspecified: Secondary | ICD-10-CM

## 2022-11-24 DIAGNOSIS — Z7902 Long term (current) use of antithrombotics/antiplatelets: Secondary | ICD-10-CM

## 2022-11-24 DIAGNOSIS — K219 Gastro-esophageal reflux disease without esophagitis: Secondary | ICD-10-CM

## 2022-11-24 NOTE — Patient Instructions (Signed)
_______________________________________________________  If your blood pressure at your visit was 140/90 or greater, please contact your primary care physician to follow up on this.  _______________________________________________________  If you are age 78 or older, your body mass index should be between 23-30. Your Body mass index is 26.47 kg/m. If this is out of the aforementioned range listed, please consider follow up with your Primary Care Provider.  If you are age 72 or younger, your body mass index should be between 19-25. Your Body mass index is 26.47 kg/m. If this is out of the aformentioned range listed, please consider follow up with your Primary Care Provider.   ________________________________________________________  The [facility] providers would like to encourage you to use [facility] to communicate with providers for non-urgent requests or questions.  Due to long hold times on the telephone, sending your provider a message by [facility] may be a faster and more efficient way to get a response.  Please allow 48 business hours for a response.  Please remember that this is for non-urgent requests.  _______________________________________________________  Call us back to schedule your egd

## 2022-11-24 NOTE — Progress Notes (Signed)
HISTORY OF PRESENT ILLNESS:  Joshua Brown is a 77 y.o. male, [place] native and self-employed downtown [employer], who is sent today by his primary care provider regarding establishment of GI care and questioning the need for endoscopic procedures.  He has multiple significant medical problems including coronary artery disease and peripheral vascular disease.  He is on chronic Plavix therapy.  His cardiologist is Dr. Excell Seltzer.  Previous history with Dr. Sharrell Ku.  Available old records reviewed.  Patient states that he has had chronic GERD for which she takes pantoprazole.  For the most part, works.  He also tells me that he had been previously diagnosed with Barrett's esophagus.  Review of biopsies from upper endoscopy August 2019 revealed benign gastroesophageal mucosa with mild chronic inflammation.  However, no intestinal metaplasia.  Patient has no family history of esophageal cancer.  Colonoscopy at that time revealed a benign inflammatory polyp.  No neoplasia.  Patient has no family history of colon cancer.  Patient reports a many year history of chronic throat clearing behavior.  This continues.  He also describes somewhat of a proximal esophageal dysphagia to dry solid foods.  No nausea or vomiting.  No weight loss.  No melena or hematochezia.  Review of blood work from January 2024 shows normal CBC with hemoglobin 14.8.  Blood work from April 2024 shows normal comprehensive metabolic panel.  REVIEW OF SYSTEMS:  All non-GI ROS negative as otherwise stated in the HPI except for arthritis, visual change, cough, hearing problems, sleeping problems, excessive urination  Past Medical History:  Diagnosis Date   ABDOMINAL AORTIC ANEURYSM 11/12/2008   has annual U/s   Atypical mole 03/08/2018   mild atypia on R lower abdomen - no treatment   CAD (coronary artery disease)    premature   DDD (degenerative disc disease)    ED (erectile dysfunction)    Frequent urination at night     nocturia 2-3   GERD (gastroesophageal reflux disease)    Hyperglycemia    Borderline   Hyperlipidemia    Atherogenic   Hypertension    Myocardial infarct, old    Obesity    Class I   PVD (peripheral vascular disease) (HCC)    ABI June '12 -0.9 bilateral.    Past Surgical History:  Procedure Laterality Date   colonoscopy     one month ago   CORONARY ARTERY BYPASS GRAFT     TONSILLECTOMY     age 85    Social History Joshua Brown  reports that he quit smoking about 53 years ago. His smoking use included cigarettes. He has a 16.00 pack-year smoking history. He has never used smokeless tobacco. He reports current alcohol use of about 2.0 standard drinks of alcohol per week. He reports that he does not use drugs.  family history includes CAD in his father and paternal grandfather; Cancer in his mother; Congestive Heart Failure in his mother; Early death in his father; Heart attack in his father and paternal grandfather; Heart disease in his father and paternal grandfather; Hyperlipidemia in his mother; Prostate cancer in his brother.  No Known Allergies     PHYSICAL EXAMINATION: Vital signs: BP 112/74   Pulse 67   Ht 5\' 6"  (1.676 m)   Wt 164 lb (74.4 kg)   BMI 26.47 kg/m   Constitutional: generally well-appearing, no acute distress Psychiatric: alert and oriented x3, cooperative Eyes: extraocular movements intact, anicteric, conjunctiva pink Mouth: oral pharynx moist, no lesions Neck: supple no  lymphadenopathy Cardiovascular: heart regular rate and rhythm, no murmur Lungs: clear to auscultation bilaterally Abdomen: soft, nontender, nondistended, no obvious ascites, no peritoneal signs, normal bowel sounds, no organomegaly Rectal: Omitted Extremities: no clubbing, cyanosis, or lower extremity edema bilaterally Skin: no lesions on visible extremities Neuro: No focal deficits.  Cranial nerves intact  ASSESSMENT:  1.  Chronic GERD.  On chronic pantoprazole. 2.   Reported history of Barrett's.  Last endoscopy did not reveal Barrett's on biopsy 3.  Vague dysphagia 4.  History of nonadenomatous polyp 2019.  No family history colon cancer 5.  Cardiovascular problems.  Stable.  On Plavix.  PLAN:  1.  Reflux precautions 2.  Continue pantoprazole 40 mg daily 3.  Upper endoscopy with possible esophageal dilation, to evaluate questionable Barrett's and vague dysphagia.  Patient is high risk given his age, comorbidities, and the need to address Plavix therapy. 4.  Hold Plavix 5 days prior to the procedure.  We discussed the pros and cons of interrupting Plavix therapy.  He understands.  We will run this by Dr. Excell Seltzer. 5.  Patient has aged out of surveillance colonoscopy program. 6.  Ongoing general medical care with Dr. Posey Rea A total time of 60 minutes was spent preparing to see the patient, reviewing myriad of records, obtaining comprehensive history, performing medically appropriate physical exam, counseling and educating patient regarding above listed issues, ordering therapeutic endoscopic procedure, and documenting clinical information in the health record

## 2022-11-26 ENCOUNTER — Telehealth: Payer: Self-pay

## 2022-11-26 DIAGNOSIS — H2513 Age-related nuclear cataract, bilateral: Secondary | ICD-10-CM | POA: Diagnosis not present

## 2022-11-26 NOTE — Telephone Encounter (Signed)
   Patient Name: Joshua Brown  DOB: April 16, 1946 MRN: [id number]  Primary Cardiologist: Tonny Bollman, MD  Chart reviewed as part of pre-operative protocol coverage.   Patient can hold Plavix 5 days prior to procedure and should resume postprocedure when surgically safe and advised by performing provider.   Napoleon Form, Leodis Rains, NP 11/26/2022, 3:22 PM

## 2022-11-26 NOTE — Telephone Encounter (Signed)
Joshua Brown,  Please contact this patient's Plavix prescribing MD for a Plavix hold per Dr. Lamar Sprinkles OV notation. This patient has a PV scheduled on 11/30/2022 in room 53 and we will need to be able to give a concrete answer on the Plavix hold number of days (Dr. Marina Goodell stated 5 days but also that we would contact Dr. Excell Seltzer for final decision.  Thank you Bre

## 2022-11-26 NOTE — Telephone Encounter (Signed)
[facility] Pre-operative Risk Assessment     Request for surgical clearance:     Endoscopy Procedure  What type of surgery is being performed?     Endoscopy  When is this surgery scheduled?     12/24/2022  What type of clearance is required ?   Pharmacy  Are there any medications that need to be held prior to surgery and how long? Plavix - 5 days  Practice name and name of physician performing surgery?      [facility]  What is your office phone and fax number?      Phone- [phone]  Fax- [phone]  Anesthesia type (None, local, MAC, general) ?       MAC

## 2022-11-30 ENCOUNTER — Ambulatory Visit (AMBULATORY_SURGERY_CENTER): Payer: Medicare PPO

## 2022-11-30 ENCOUNTER — Other Ambulatory Visit: Payer: Self-pay | Admitting: Cardiovascular Disease

## 2022-11-30 VITALS — Ht 66.0 in | Wt 164.0 lb

## 2022-11-30 DIAGNOSIS — K219 Gastro-esophageal reflux disease without esophagitis: Secondary | ICD-10-CM

## 2022-11-30 DIAGNOSIS — K2270 Barrett's esophagus without dysplasia: Secondary | ICD-10-CM

## 2022-11-30 NOTE — Progress Notes (Signed)
Pre visit completed via phone call; Patient verified name, DOB, and address;  No egg or soy allergy known to patient  No issues known to pt with past sedation with any surgeries or procedures Patient denies ever being told they had issues or difficulty with intubation  No FH of Malignant Hyperthermia Pt is not on diet pills Pt is not on home 02  Pt is not on blood thinners  Pt denies issues with constipation  No A fib or A flutter Have any cardiac testing pending--NO Pt instructed to use [email or web address] or GoodRx for a price reduction on prep   Insurance verified during PV appt=[employer]  Patient's chart reviewed by Cathlyn Parsons CNRA prior to previsit and patient appropriate for the [facility].  Previsit completed and red dot placed by patient's name on their procedure day (on provider's schedule).    Patient reports he will receive his instructions via MyChart;

## 2022-11-30 NOTE — Telephone Encounter (Signed)
Noted on Pre Visit chart- And patient aware of medication hold

## 2022-12-01 ENCOUNTER — Other Ambulatory Visit: Payer: Self-pay | Admitting: *Deleted

## 2022-12-01 MED ORDER — DAPAGLIFLOZIN PROPANEDIOL 5 MG PO TABS: 5.0000 mg | ORAL_TABLET | Freq: Every day | ORAL | 3 refills | Status: DC

## 2022-12-10 ENCOUNTER — Encounter: Payer: Self-pay | Admitting: Internal Medicine

## 2022-12-24 ENCOUNTER — Ambulatory Visit (AMBULATORY_SURGERY_CENTER): Payer: Medicare PPO | Admitting: Internal Medicine

## 2022-12-24 ENCOUNTER — Encounter: Payer: Self-pay | Admitting: Internal Medicine

## 2022-12-24 VITALS — BP 98/53 | HR 60 | Temp 98.2°F | Resp 13 | Ht 66.0 in | Wt 164.0 lb

## 2022-12-24 DIAGNOSIS — K219 Gastro-esophageal reflux disease without esophagitis: Secondary | ICD-10-CM

## 2022-12-24 DIAGNOSIS — K2270 Barrett's esophagus without dysplasia: Secondary | ICD-10-CM | POA: Diagnosis not present

## 2022-12-24 DIAGNOSIS — I2510 Atherosclerotic heart disease of native coronary artery without angina pectoris: Secondary | ICD-10-CM | POA: Diagnosis not present

## 2022-12-24 DIAGNOSIS — E669 Obesity, unspecified: Secondary | ICD-10-CM | POA: Diagnosis not present

## 2022-12-24 DIAGNOSIS — I10 Essential (primary) hypertension: Secondary | ICD-10-CM | POA: Diagnosis not present

## 2022-12-24 MED ORDER — SODIUM CHLORIDE 0.9 % IV SOLN
500.0000 mL | Freq: Once | INTRAVENOUS | Status: DC
Start: 2022-12-24 — End: 2022-12-24

## 2022-12-24 NOTE — Progress Notes (Signed)
Sedate, gd SR, tolerated procedure well, VSS, report to RN 

## 2022-12-24 NOTE — Progress Notes (Signed)
VS completed by DT.  Pt's states no medical or surgical changes since previsit or office visit.  

## 2022-12-24 NOTE — Op Note (Signed)
[facility] Patient Name: Joshua Brown Procedure Date: 12/24/2022 9:54 AM MRN: [id number] Endoscopist: Wilhemina Bonito. Marina Goodell , MD, [id number] Age: 77 Referring MD:  Date of Birth: 02/27/1946 Gender: Male Account #: [id number] Procedure:                Upper GI endoscopy Indications:              Esophageal reflux, Follow-up of Barrett's esophagus Medicines:                Monitored Anesthesia Care Procedure:                Pre-Anesthesia Assessment:                           - Prior to the procedure, a History and Physical                            was performed, and patient medications and                            allergies were reviewed. The patient's tolerance of                            previous anesthesia was also reviewed. The risks                            and benefits of the procedure and the sedation                            options and risks were discussed with the patient.                            All questions were answered, and informed consent                            was obtained. Prior Anticoagulants: The patient has                            taken Plavix (clopidogrel), last dose was 4 days                            prior to procedure. ASA Grade Assessment: III - A                            patient with severe systemic disease. After                            reviewing the risks and benefits, the patient was                            deemed in satisfactory condition to undergo the                            procedure.  After obtaining informed consent, the endoscope was                            passed under direct vision. Throughout the                            procedure, the patient's blood pressure, pulse, and                            oxygen saturations were monitored continuously. The                            GIF [id number] #[id number] was introduced through the                            mouth, and advanced to the second  part of duodenum.                            The upper GI endoscopy was accomplished without                            difficulty. The patient tolerated the procedure                            well. Scope In: Scope Out: Findings:                 The esophagus was normal. NO Barrett's esophagus or                            stricture                           The stomach was normal. Moderate hiatal hernia                            present.                           The examined duodenum was normal.                           The cardia and gastric fundus were normal on                            retroflexion. Complications:            No immediate complications. Estimated Blood Loss:     Estimated blood loss: none. Impression:               - Normal esophagus.                           - Normal stomach.                           - Normal examined duodenum.                           -  No specimens collected. Recommendation:           - Patient has a contact number available for                            emergencies. The signs and symptoms of potential                            delayed complications were discussed with the                            patient. Return to normal activities tomorrow.                            Written discharge instructions were provided to the                            patient.                           - Resume previous diet.                           - Continue present medications.                           - Resume Plavix (clopidogrel) at prior dose today. Wilhemina Bonito. Marina Goodell, MD 12/24/2022 10:15:40 AM This report has been signed electronically.

## 2022-12-24 NOTE — Patient Instructions (Signed)
RESUME Plavix at prior dose today.  YOU HAD AN ENDOSCOPIC PROCEDURE TODAY AT THE [facility]:   Refer to the procedure report that was given to you for any specific questions about what was found during the examination.  If the procedure report does not answer your questions, please call your gastroenterologist to clarify.  If you requested that your care partner not be given the details of your procedure findings, then the procedure report has been included in a sealed envelope for you to review at your convenience later.  YOU SHOULD EXPECT: Some feelings of bloating in the abdomen. Passage of more gas than usual.  Walking can help get rid of the air that was put into your GI tract during the procedure and reduce the bloating. If you had a lower endoscopy (such as a colonoscopy or flexible sigmoidoscopy) you may notice spotting of blood in your stool or on the toilet paper. If you underwent a bowel prep for your procedure, you may not have a normal bowel movement for a few days.  Please Note:  You might notice some irritation and congestion in your nose or some drainage.  This is from the oxygen used during your procedure.  There is no need for concern and it should clear up in a day or so.  SYMPTOMS TO REPORT IMMEDIATELY:  Following upper endoscopy (EGD)  Vomiting of blood or coffee ground material  New chest pain or pain under the shoulder blades  Painful or persistently difficult swallowing  New shortness of breath  Fever of 100F or higher  Black, tarry-looking stools  For urgent or emergent issues, a gastroenterologist can be reached at any hour by calling [phone]. Do not use MyChart messaging for urgent concerns.    DIET:  We do recommend a small meal at first, but then you may proceed to your regular diet.  Drink plenty of fluids but you should avoid alcoholic beverages for 24 hours.  ACTIVITY:  You should plan to take it easy for the rest of today and you should  NOT DRIVE or use heavy machinery until tomorrow (because of the sedation medicines used during the test).    FOLLOW UP: Our staff will call the number listed on your records the next business day following your procedure.  We will call around 7:15- 8:00 am to check on you and address any questions or concerns that you may have regarding the information given to you following your procedure. If we do not reach you, we will leave a message.     If any biopsies were taken you will be contacted by phone or by letter within the next 1-3 weeks.  Please call us at [phone] if you have not heard about the biopsies in 3 weeks.    SIGNATURES/CONFIDENTIALITY: You and/or your care partner have signed paperwork which will be entered into your electronic medical record.  These signatures attest to the fact that that the information above on your After Visit Summary has been reviewed and is understood.  Full responsibility of the confidentiality of this discharge information lies with you and/or your care-partner.

## 2022-12-24 NOTE — Progress Notes (Signed)
HISTORY OF PRESENT ILLNESS:   Joshua [place] is a 77 y.o. male, [place] native and self-employed [place] [employer], who is sent today by his primary care provider regarding establishment of GI care and questioning the need for endoscopic procedures.  He has multiple significant medical problems including coronary artery disease and peripheral vascular disease.  He is on chronic Plavix therapy.  His cardiologist is Dr. Excell Seltzer.   Previous history with Dr. Sharrell Ku.  Available old records reviewed.  Patient states that he has had chronic GERD for which she takes pantoprazole.  For the most part, works.  He also tells me that he had been previously diagnosed with Barrett's esophagus.  Review of biopsies from upper endoscopy August 2019 revealed benign gastroesophageal mucosa with mild chronic inflammation.  However, no intestinal metaplasia.  Patient has no family history of esophageal cancer.   Colonoscopy at that time revealed a benign inflammatory polyp.  No neoplasia.  Patient has no family history of colon cancer.   Patient reports a many year history of chronic throat clearing behavior.  This continues.  He also describes somewhat of a proximal esophageal dysphagia to dry solid foods.  No nausea or vomiting.  No weight loss.  No melena or hematochezia.   Review of blood work from January 2024 shows normal CBC with hemoglobin 14.8.  Blood work from April 2024 shows normal comprehensive metabolic panel.   REVIEW OF SYSTEMS:   All non-GI ROS negative as otherwise stated in the HPI except for arthritis, visual change, cough, hearing problems, sleeping problems, excessive urination       Past Medical History:  Diagnosis Date   ABDOMINAL AORTIC ANEURYSM 11/12/2008    has annual U/s   Atypical mole 03/08/2018    mild atypia on R lower abdomen - no treatment   CAD (coronary artery disease)      premature   DDD (degenerative disc disease)     ED (erectile dysfunction)     Frequent  urination at night      nocturia 2-3   GERD (gastroesophageal reflux disease)     Hyperglycemia      Borderline   Hyperlipidemia      Atherogenic   Hypertension     Myocardial infarct, old     Obesity      Class I   PVD (peripheral vascular disease) (HCC)      ABI June '12 -0.9 bilateral.           Past Surgical History:  Procedure Laterality Date   colonoscopy        one month ago   CORONARY ARTERY BYPASS GRAFT       TONSILLECTOMY        age 40      Social History [name]  reports that he quit smoking about 53 years ago. His smoking use included cigarettes. He has a 16.00 pack-year smoking history. He has never used smokeless tobacco. He reports current alcohol use of about 2.0 standard drinks of alcohol per week. He reports that he does not use drugs.   family history includes CAD in his father and paternal grandfather; Cancer in his mother; Congestive Heart Failure in his mother; Early death in his father; Heart attack in his father and paternal grandfather; Heart disease in his father and paternal grandfather; Hyperlipidemia in his mother; Prostate cancer in his brother.   No Known Allergies       PHYSICAL EXAMINATION: Vital signs: BP 112/74  Pulse 67   Ht 5\' 6"  (1.676 m)   Wt 164 lb (74.4 kg)   BMI 26.47 kg/m   Constitutional: generally well-appearing, no acute distress Psychiatric: alert and oriented x3, cooperative Eyes: extraocular movements intact, anicteric, conjunctiva pink Mouth: oral pharynx moist, no lesions Neck: supple no lymphadenopathy Cardiovascular: heart regular rate and rhythm, no murmur Lungs: clear to auscultation bilaterally Abdomen: soft, nontender, nondistended, no obvious ascites, no peritoneal signs, normal bowel sounds, no organomegaly Rectal: Omitted Extremities: no clubbing, cyanosis, or lower extremity edema bilaterally Skin: no lesions on visible extremities Neuro: No focal deficits.  Cranial nerves intact   ASSESSMENT:    1.  Chronic GERD.  On chronic pantoprazole. 2.  Reported history of Barrett's.  Last endoscopy did not reveal Barrett's on biopsy 3.  Vague dysphagia 4.  History of nonadenomatous polyp 2019.  No family history colon cancer 5.  Cardiovascular problems.  Stable.  On Plavix.   PLAN:   1.  Reflux precautions 2.  Continue pantoprazole 40 mg daily 3.  Upper endoscopy with possible esophageal dilation, to evaluate questionable Barrett's and vague dysphagia.  Patient is high risk given his age, comorbidities, and the need to address Plavix therapy. 4.  Hold Plavix 5 days prior to the procedure.  We discussed the pros and cons of interrupting Plavix therapy.  He understands.  We will run this by Dr. Excell Seltzer. 5.  Patient has aged out of surveillance colonoscopy program. 6.  Ongoing general medical care with Dr. Posey Rea

## 2022-12-28 ENCOUNTER — Telehealth: Payer: Self-pay | Admitting: *Deleted

## 2022-12-28 NOTE — Telephone Encounter (Signed)
Left message on f/u call 

## 2023-01-10 DIAGNOSIS — Z961 Presence of intraocular lens: Secondary | ICD-10-CM | POA: Diagnosis not present

## 2023-01-10 DIAGNOSIS — H25812 Combined forms of age-related cataract, left eye: Secondary | ICD-10-CM | POA: Diagnosis not present

## 2023-01-10 DIAGNOSIS — H2512 Age-related nuclear cataract, left eye: Secondary | ICD-10-CM | POA: Diagnosis not present

## 2023-01-10 HISTORY — PX: CATARACT EXTRACTION: SUR2

## 2023-01-12 ENCOUNTER — Other Ambulatory Visit: Payer: Self-pay

## 2023-01-12 MED ORDER — METOPROLOL SUCCINATE ER 50 MG PO TB24: ORAL_TABLET | ORAL | 0 refills | Status: DC

## 2023-01-12 MED ORDER — PANTOPRAZOLE SODIUM 40 MG PO TBEC: 40.0000 mg | DELAYED_RELEASE_TABLET | Freq: Every day | ORAL | 0 refills | Status: DC

## 2023-01-12 MED ORDER — ATORVASTATIN CALCIUM 80 MG PO TABS: 80.0000 mg | ORAL_TABLET | Freq: Every day | ORAL | 0 refills | Status: DC

## 2023-01-12 MED ORDER — EZETIMIBE 10 MG PO TABS: 10.0000 mg | ORAL_TABLET | Freq: Every day | ORAL | 0 refills | Status: DC

## 2023-01-12 MED ORDER — LOSARTAN POTASSIUM 25 MG PO TABS: 25.0000 mg | ORAL_TABLET | Freq: Every day | ORAL | 0 refills | Status: DC

## 2023-01-24 ENCOUNTER — Other Ambulatory Visit: Payer: Self-pay | Admitting: Cardiovascular Disease

## 2023-01-24 ENCOUNTER — Encounter: Payer: Self-pay | Admitting: Cardiovascular Disease

## 2023-01-25 MED ORDER — ATORVASTATIN CALCIUM 80 MG PO TABS: 80.0000 mg | ORAL_TABLET | Freq: Every day | ORAL | 0 refills | Status: DC

## 2023-01-25 MED ORDER — CLOPIDOGREL BISULFATE 75 MG PO TABS: 75.0000 mg | ORAL_TABLET | Freq: Every day | ORAL | 0 refills | Status: DC

## 2023-01-25 MED ORDER — EZETIMIBE 10 MG PO TABS: 10.0000 mg | ORAL_TABLET | Freq: Every day | ORAL | 0 refills | Status: DC

## 2023-01-25 MED ORDER — METOPROLOL SUCCINATE ER 50 MG PO TB24: ORAL_TABLET | ORAL | 0 refills | Status: DC

## 2023-01-25 MED ORDER — LOSARTAN POTASSIUM 25 MG PO TABS: 25.0000 mg | ORAL_TABLET | Freq: Every day | ORAL | 0 refills | Status: DC

## 2023-01-28 ENCOUNTER — Ambulatory Visit: Payer: Medicare PPO

## 2023-01-28 VITALS — Ht 66.0 in | Wt 164.0 lb

## 2023-01-28 DIAGNOSIS — Z0000 Encounter for general adult medical examination without abnormal findings: Secondary | ICD-10-CM | POA: Diagnosis not present

## 2023-01-28 NOTE — Progress Notes (Cosign Needed Addendum)
Subjective:   Joshua Brown is a 77 y.o. male who presents for Medicare Annual/Subsequent preventive examination.  Visit Complete: Virtual  I connected with  Noralee Stain on 01/28/23 by a audio enabled telemedicine application and verified that I am speaking with the correct person using two identifiers.  Patient Location: Home  Provider Location: Office/Clinic  I discussed the limitations of evaluation and management by telemedicine. The patient expressed understanding and agreed to proceed.  Patient Medicare AWV questionnaire was completed by the patient on 01/24/2023; I have confirmed that all information answered by patient is correct and no changes since this date.  Vital Signs: Per patient no change in vitals since last visit.   Review of Systems    Cardiac Risk Factors include: advanced age (>31men, >93 women);male gender;hypertension;diabetes mellitus;Other (see comment), Risk factor comments: CAD, PVD, myocardial infarction     Objective:    Today's Vitals   01/28/23 0817  Weight: 164 lb (74.4 kg)  Height: 5\' 6"  (1.676 m)   Body mass index is 26.47 kg/m.     01/28/2023    8:28 AM 07/30/2021    9:17 AM 07/08/2020   10:14 AM 06/22/2019    9:13 AM  Advanced Directives  Does Patient Have a Medical Advance Directive? Yes Yes Yes Yes  Type of Estate agent of [place];Living will Living will;Healthcare Power of Attorney Living will;Healthcare Power of [employer] Power of [place];Living will  Does patient want to make changes to medical advance directive?  No - Patient declined No - Patient declined   Copy of Healthcare Power of Attorney in Chart? No - copy requested No - copy requested No - copy requested No - copy requested    Current Medications (verified) Outpatient Encounter Medications as of 01/28/2023  Medication Sig   ALPRAZolam (XANAX) 0.5 MG tablet TAKE 1 TABLET AT BEDTIME AS NEEDED FOR ANXIETY   atorvastatin (LIPITOR) 80 MG  tablet Take 1 tablet (80 mg total) by mouth daily.   b complex vitamins capsule Take 1 capsule by mouth daily.     Cetirizine HCl (KLS ALLER-TEC PO) Take 1 tablet by mouth daily as needed (allergies).   Cholecalciferol (VITAMIN D) 2000 UNIT CAPS Take 2,000 Units by mouth daily.    clobetasol cream (TEMOVATE) 0.05 % Apply 1 application. topically 2 (two) times daily.   clopidogrel (PLAVIX) 75 MG tablet Take 1 tablet (75 mg total) by mouth daily.   dapagliflozin propanediol (FARXIGA) 5 MG TABS tablet Take 1 tablet (5 mg total) by mouth daily before breakfast.   ezetimibe (ZETIA) 10 MG tablet Take 1 tablet (10 mg total) by mouth daily.   losartan (COZAAR) 25 MG tablet Take 1 tablet (25 mg total) by mouth daily.   metoprolol succinate (TOPROL-XL) 50 MG 24 hr tablet TAKE 1 TABLET EVERY DAY WITH OR IMMEDIATELY AFTER A MEAL   niacin (NIASPAN) 500 MG CR tablet Take 2,000 mg by mouth at bedtime.   nitroGLYCERIN (NITROSTAT) 0.4 MG SL tablet PLACE 1 TABLET UNDER THE TONGUE EVERY 5 MINUTES AS NEEDED FOR CHEST PAIN.   pantoprazole (PROTONIX) 40 MG tablet Take 1 tablet (40 mg total) by mouth daily.   sildenafil (REVATIO) 20 MG tablet Take 2-5 tablets by mouth as needed prior to intercourse   naproxen (NAPROSYN) 500 MG tablet 1 tablet with food or milk as needed (Patient not taking: Reported on 11/30/2022)   No facility-administered encounter medications on file as of 01/28/2023.    Allergies (verified) Patient  has no known allergies.   History: Past Medical History:  Diagnosis Date   ABDOMINAL AORTIC ANEURYSM 11/12/2008   has annual U/s   Atypical mole 03/08/2018   mild atypia on R lower abdomen - no treatment   CAD (coronary artery disease)    premature   DDD (degenerative disc disease)    ED (erectile dysfunction)    Frequent urination at night    nocturia 2-3   GERD (gastroesophageal reflux disease)    Hyperglycemia    Borderline   Hyperlipidemia    Atherogenic   Hypertension    Myocardial  infarct, old    Obesity    Class I   PVD (peripheral vascular disease) (HCC)    ABI June '12 -0.9 bilateral.   Past Surgical History:  Procedure Laterality Date   CATARACT EXTRACTION Left 01/10/2023   colonoscopy     one month ago   COLONOSCOPY     CORONARY ARTERY BYPASS GRAFT     TONSILLECTOMY     age 78   UPPER GASTROINTESTINAL ENDOSCOPY     Family History  Problem Relation Age of Onset   Cancer Mother        Jaw Cancer-hx of smoker   Hyperlipidemia Mother    Congestive Heart Failure Mother    Early death Father    Heart disease Father    CAD Father    Heart attack Father    Prostate cancer Brother    Heart disease Paternal Grandfather    CAD Paternal Grandfather    Heart attack Paternal Grandfather    Esophageal cancer Neg Hx    Stomach cancer Neg Hx    Colon cancer Neg Hx    Colon polyps Neg Hx    Rectal cancer Neg Hx    Social History   Socioeconomic History   Marital status: Married    Spouse name: Windell Moulding   Number of children: 3   Years of education: 17   Highest education level: Bachelor's degree (e.g., BA, AB, BS)  Occupational History   Occupation: REAL Environmental manager: SELF EMPLOYED    Comment: 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 [place]   Occupation: retired  Tobacco Use   Smoking status: Former    Current packs/day: 0.00    Average packs/day: 1 pack/day for 16.0 years (16.0 ttl pk-yrs)    Types: Cigarettes    Start date: 07/05/1953    Quit date: 07/05/1969    Years since quitting: 53.6   Smokeless tobacco: Never  Vaping Use   Vaping status: Never Used  Substance and Sexual Activity   Alcohol use: Yes    Alcohol/week: 2.0 standard drinks of alcohol    Types: 2 Standard drinks or equivalent per week    Comment: socially   Drug use: No   Sexual activity: Yes    Partners: Female  Other Topics Concern   Not on file  Social History Narrative   HSG, [employer] 1 year. Married 1977. 1 son - 66; 2 daughters - 15, 45; 3  Grandchildren. Work - self -  employed Fish farm manager. Marriage in good health. ACP - discussed. Provided packet May '13.   Social Determinants of Health   Financial Resource Strain: Low Risk  (01/28/2023)   Overall Financial Resource Strain (CARDIA)    Difficulty of Paying Living Expenses: Not hard at all  Food Insecurity: No Food Insecurity (01/28/2023)   Hunger Vital Sign    Worried About Running Out of Food in the Last  Year: Never true    Ran Out of Food in the Last Year: Never true  Transportation Needs: No Transportation Needs (01/28/2023)   PRAPARE - Administrator, Civil Service (Medical): No    Lack of Transportation (Non-Medical): No  Physical Activity: Sufficiently Active (01/28/2023)   Exercise Vital Sign    Days of Exercise per Week: 3 days    Minutes of Exercise per Session: 60 min  Recent Concern: Physical Activity - Insufficiently Active (11/01/2022)   Exercise Vital Sign    Days of Exercise per Week: 2 days    Minutes of Exercise per Session: 60 min  Stress: Stress Concern Present (01/28/2023)   [employer] of Occupational Health - Occupational Stress Questionnaire    Feeling of Stress : To some extent  Social Connections: Socially Integrated (01/28/2023)   Social Connection and Isolation Panel [NHANES]    Frequency of Communication with Friends and Family: More than three times a week    Frequency of Social Gatherings with Friends and Family: More than three times a week    Attends Religious Services: 1 to 4 times per year    Active Member of [employer] or Organizations: Yes    Attends Engineer, structural: More than 4 times per year    Marital Status: Married    Tobacco Counseling Counseling given: Not Answered   Clinical Intake:  Pre-visit preparation completed: Yes  Pain : No/denies pain     BMI - recorded: 26.47 Nutritional Status: BMI 25 -29 Overweight Nutritional Risks: None Diabetes: Yes CBG done?: No Did pt. bring in CBG monitor from home?: No  How often  do you need to have someone help you when you read instructions, pamphlets, or other written materials from your doctor or pharmacy?: 1 - Never  Interpreter Needed?: No  Information entered by :: Tyana Butzer, RMA   Activities of Daily Living    01/28/2023    8:21 AM 01/24/2023    8:23 AM  In your present state of health, do you have any difficulty performing the following activities:  Hearing? 1 1  Vision? 0 0  Difficulty concentrating or making decisions? 0 0  Walking or climbing stairs? 0 0  Dressing or bathing? 0 0  Doing errands, shopping? 0 0  Preparing Food and eating ? N N  Using the Toilet? N N  In the past six months, have you accidently leaked urine? Y Y  Comment slight leaking   Do you have problems with loss of bowel control? N N  Managing your Medications? N N  Managing your Finances? N N  Housekeeping or managing your Housekeeping? N N    Patient Care Team: Plotnikov, Georgina Quint, MD as PCP - General (Internal Medicine) Tonny Bollman, MD as PCP - Cardiology (Cardiology) Sharrell Ku, MD as Consulting Physician (Gastroenterology) Elise Benne, MD as Consulting Physician (Ophthalmology) Jethro Bolus, MD (Inactive) as Consulting Physician (Urology) Oretha Milch, MD as Consulting Physician (Pulmonary Disease) Janalyn Harder, MD (Inactive) as Consulting Physician (Dermatology) Donnetta Hail, MD as Consulting Physician (Rheumatology) Heloise Purpura, MD as Consulting Physician (Urology)  Indicate any recent Medical Services you may have received from other than [facility] providers in the past year (date may be approximate).     Assessment:   This is a routine wellness examination for [place].  Hearing/Vision screen Hearing Screening - Comments:: Wear hearing aides-both sides  Dietary issues and exercise activities discussed:     Goals Addressed  This Visit's Progress    Patient Stated   On track    I want to increase my physical  activity.       Depression Screen    01/28/2023    8:32 AM 11/02/2022    9:26 AM 08/04/2022    9:08 AM 07/30/2021    9:16 AM 08/27/2020    8:35 AM 07/08/2020   10:12 AM 06/22/2019    9:11 AM  PHQ 2/9 Scores  PHQ - 2 Score 0 0 0 0 0 0 0  PHQ- 9 Score 0          Fall Risk    01/28/2023    8:29 AM 01/24/2023    8:23 AM 11/02/2022    9:26 AM 08/04/2022    9:08 AM 07/30/2021    9:18 AM  Fall Risk   Falls in the past year? 0 0 0 0 0  Number falls in past yr: 0  0 0 0  Injury with Fall? 0  0 0 0  Risk for fall due to : No Fall Risks  No Fall Risks No Fall Risks No Fall Risks  Follow up Falls prevention discussed  Falls evaluation completed Falls evaluation completed Falls evaluation completed    MEDICARE RISK AT HOME:   TIMED UP AND GO:  Was the test performed?  No    Cognitive Function:        01/28/2023    8:29 AM  6CIT Screen  What Year? 0 points  What month? 0 points  What time? 0 points  Count back from 20 0 points  Months in reverse 0 points  Repeat phrase 0 points  Total Score 0 points    Immunizations Immunization History  Administered Date(s) Administered   Fluad Quad(high Dose 65+) 04/09/2019   Hepatitis A 05/05/2006, 11/23/2006   Influenza, High Dose Seasonal PF 04/18/2016, 04/25/2017, 04/10/2018, 05/13/2020, 04/23/2021   Influenza-Unspecified 04/19/2022   Moderna Sars-Covid-2 Vaccination 10/11/2020   PFIZER(Purple Top)SARS-COV-2 Vaccination 07/24/2019, 08/14/2019, 02/28/2020   Pfizer Covid-19 Vaccine Bivalent Booster 78yrs & up 03/25/2021   Pneumococcal Conjugate-13 11/22/2013   Pneumococcal Polysaccharide-23 11/12/2011, 08/27/2020   Td 05/05/2006, 12/17/2013   Tdap 05/05/2006, 10/04/2019   Zoster Recombinant(Shingrix) 12/14/2021, 03/22/2022   Zoster, Live 01/02/2014    TDAP status: Up to date  Flu Vaccine status: Up to date  Pneumococcal vaccine status: Up to date  Covid-19 vaccine status: Completed vaccines  Qualifies for Shingles Vaccine?  Yes   Zostavax completed Yes   Shingrix Completed?: Yes  Screening Tests Health Maintenance  Topic Date Due   FOOT EXAM  Never done   Diabetic kidney evaluation - Urine ACR  Never done   OPHTHALMOLOGY EXAM  02/01/2019   COVID-19 Vaccine (6 - 2023-24 season) 03/05/2022   Colonoscopy  02/22/2023   INFLUENZA VACCINE  02/03/2023   HEMOGLOBIN A1C  05/04/2023   Diabetic kidney evaluation - eGFR measurement  11/02/2023   Medicare Annual Wellness (AWV)  01/28/2024   DTaP/Tdap/Td (5 - Td or Tdap) 10/03/2029   Pneumonia Vaccine 56+ Years old  Completed   Hepatitis C Screening  Completed   Zoster Vaccines- Shingrix  Completed   HPV VACCINES  Aged Out    Health Maintenance  Health Maintenance Due  Topic Date Due   FOOT EXAM  Never done   Diabetic kidney evaluation - Urine ACR  Never done   OPHTHALMOLOGY EXAM  02/01/2019   COVID-19 Vaccine (6 - 2023-24 season) 03/05/2022   Colonoscopy  02/22/2023  Colorectal cancer screening: No longer required.   Lung Cancer Screening: (Low Dose CT Chest recommended if Age 90-80 years, 20 pack-year currently smoking OR have quit w/in 15years.) does not qualify.   Lung Cancer Screening Referral: N/A  Additional Screening:  Hepatitis C Screening: does qualify; Completed 12/08/2015  Vision Screening: Recommended annual ophthalmology exams for early detection of glaucoma and other disorders of the eye. Is the patient up to date with their annual eye exam?  Yes  Who is the provider or what is the name of the office in which the patient attends annual eye exams?Emily Filbert 11/10/22 If pt is not established with a provider, would they like to be referred to a provider to establish care? No .   Dental Screening: Recommended annual dental exams for proper oral hygiene  Diabetic Foot Exam: Diabetic Foot Exam: Completed 11/02/2022  Community Resource Referral / Chronic Care Management: CRR required this visit?  No   CCM required this visit?  No      Plan:     I have personally reviewed and noted the following in the patient's chart:   Medical and social history Use of alcohol, tobacco or illicit drugs  Current medications and supplements including opioid prescriptions. Patient is not currently taking opioid prescriptions. Functional ability and status Nutritional status Physical activity Advanced directives List of other physicians Hospitalizations, surgeries, and ER visits in previous 12 months Vitals Screenings to include cognitive, depression, and falls Referrals and appointments  In addition, I have reviewed and discussed with patient certain preventive protocols, quality metrics, and best practice recommendations. A written personalized care plan for preventive services as well as general preventive health recommendations were provided to patient.     Shenee Wignall L Izzabelle Bouley, CMA   01/28/2023   After Visit Summary: (MyChart) Due to this being a telephonic visit, the after visit summary with patients personalized plan was offered to patient via MyChart   Nurse Notes: Patient has no concerns today.   Medical screening examination/treatment/procedure(s) were performed by non-physician practitioner and as supervising physician I was immediately available for consultation/collaboration.  I agree with above. Jacinta Shoe, MD

## 2023-01-28 NOTE — Patient Instructions (Signed)
[name] , Thank you for taking time to come for your Medicare Wellness Visit. I appreciate your ongoing commitment to your health goals. Please review the following plan we discussed and let me know if I can assist you in the future.   Referrals/Orders/Follow-Ups/Clinician Recommendations: Keep up the good work.  This is a list of the screening recommended for you and due dates:  Health Maintenance  Topic Date Due   Complete foot exam   Never done   Yearly kidney health urinalysis for diabetes  Never done   Eye exam for diabetics  02/01/2019   COVID-19 Vaccine (6 - 2023-24 season) 03/05/2022   Colon Cancer Screening  02/22/2023   Flu Shot  02/03/2023   Hemoglobin A1C  05/04/2023   Yearly kidney function blood test for diabetes  11/02/2023   Medicare Annual Wellness Visit  01/28/2024   DTaP/Tdap/Td vaccine (5 - Td or Tdap) 10/03/2029   Pneumonia Vaccine  Completed   Hepatitis C Screening  Completed   Zoster (Shingles) Vaccine  Completed   HPV Vaccine  Aged Out    Advanced directives: (Copy Requested) Please bring a copy of your health care power of attorney and living will to the office to be added to your chart at your convenience.  Next Medicare Annual Wellness Visit scheduled for next year: Yes  Preventive Care 4 Years and Older, Male  Preventive care refers to lifestyle choices and visits with your health care provider that can promote health and wellness. What does preventive care include? A yearly physical exam. This is also called an annual well check. Dental exams once or twice a year. Routine eye exams. Ask your health care provider how often you should have your eyes checked. Personal lifestyle choices, including: Daily care of your teeth and gums. Regular physical activity. Eating a healthy diet. Avoiding tobacco and drug use. Limiting alcohol use. Practicing safe sex. Taking low doses of aspirin every day. Taking vitamin and mineral supplements as recommended  by your health care provider. What happens during an annual well check? The services and screenings done by your health care provider during your annual well check will depend on your age, overall health, lifestyle risk factors, and family history of disease. Counseling  Your health care provider may ask you questions about your: Alcohol use. Tobacco use. Drug use. Emotional well-being. Home and relationship well-being. Sexual activity. Eating habits. History of falls. Memory and ability to understand (cognition). Work and work Astronomer. Screening  You may have the following tests or measurements: Height, weight, and BMI. Blood pressure. Lipid and cholesterol levels. These may be checked every 5 years, or more frequently if you are over 39 years old. Skin check. Lung cancer screening. You may have this screening every year starting at age 89 if you have a 30-pack-year history of smoking and currently smoke or have quit within the past 15 years. Fecal occult blood test (FOBT) of the stool. You may have this test every year starting at age 20. Flexible sigmoidoscopy or colonoscopy. You may have a sigmoidoscopy every 5 years or a colonoscopy every 10 years starting at age 69. Prostate cancer screening. Recommendations will vary depending on your family history and other risks. Hepatitis C blood test. Hepatitis B blood test. Sexually transmitted disease (STD) testing. Diabetes screening. This is done by checking your blood sugar (glucose) after you have not eaten for a while (fasting). You may have this done every 1-3 years. Abdominal aortic aneurysm (AAA) screening. You may need  this if you are a current or former smoker. Osteoporosis. You may be screened starting at age 69 if you are at high risk. Talk with your health care provider about your test results, treatment options, and if necessary, the need for more tests. Vaccines  Your health care provider may recommend certain  vaccines, such as: Influenza vaccine. This is recommended every year. Tetanus, diphtheria, and acellular pertussis (Tdap, Td) vaccine. You may need a Td booster every 10 years. Zoster vaccine. You may need this after age 61. Pneumococcal 13-valent conjugate (PCV13) vaccine. One dose is recommended after age 59. Pneumococcal polysaccharide (PPSV23) vaccine. One dose is recommended after age 70. Talk to your health care provider about which screenings and vaccines you need and how often you need them. This information is not intended to replace advice given to you by your health care provider. Make sure you discuss any questions you have with your health care provider. Document Released: 07/18/2015 Document Revised: 03/10/2016 Document Reviewed: 04/22/2015 Elsevier Interactive Patient Education  2017 [employer].  Fall Prevention in the Home Falls can cause injuries. They can happen to people of all ages. There are many things you can do to make your home safe and to help prevent falls. What can I do on the outside of my home? Regularly fix the edges of walkways and driveways and fix any cracks. Remove anything that might make you trip as you walk through a door, such as a raised step or threshold. Trim any bushes or trees on the path to your home. Use bright outdoor lighting. Clear any walking paths of anything that might make someone trip, such as rocks or tools. Regularly check to see if handrails are loose or broken. Make sure that both sides of any steps have handrails. Any raised decks and porches should have guardrails on the edges. Have any leaves, snow, or ice cleared regularly. Use sand or salt on walking paths during winter. Clean up any spills in your garage right away. This includes oil or grease spills. What can I do in the bathroom? Use night lights. Install grab bars by the toilet and in the tub and shower. Do not use towel bars as grab bars. Use non-skid mats or decals in  the tub or shower. If you need to sit down in the shower, use a plastic, non-slip stool. Keep the floor dry. Clean up any water that spills on the floor as soon as it happens. Remove soap buildup in the tub or shower regularly. Attach bath mats securely with double-sided non-slip rug tape. Do not have throw rugs and other things on the floor that can make you trip. What can I do in the bedroom? Use night lights. Make sure that you have a light by your bed that is easy to reach. Do not use any sheets or blankets that are too big for your bed. They should not hang down onto the floor. Have a firm chair that has side arms. You can use this for support while you get dressed. Do not have throw rugs and other things on the floor that can make you trip. What can I do in the kitchen? Clean up any spills right away. Avoid walking on wet floors. Keep items that you use a lot in easy-to-reach places. If you need to reach something above you, use a strong step stool that has a grab bar. Keep electrical cords out of the way. Do not use floor polish or wax that makes floors  slippery. If you must use wax, use non-skid floor wax. Do not have throw rugs and other things on the floor that can make you trip. What can I do with my stairs? Do not leave any items on the stairs. Make sure that there are handrails on both sides of the stairs and use them. Fix handrails that are broken or loose. Make sure that handrails are as long as the stairways. Check any carpeting to make sure that it is firmly attached to the stairs. Fix any carpet that is loose or worn. Avoid having throw rugs at the top or bottom of the stairs. If you do have throw rugs, attach them to the floor with carpet tape. Make sure that you have a light switch at the top of the stairs and the bottom of the stairs. If you do not have them, ask someone to add them for you. What else can I do to help prevent falls? Wear shoes that: Do not have high  heels. Have rubber bottoms. Are comfortable and fit you well. Are closed at the toe. Do not wear sandals. If you use a stepladder: Make sure that it is fully opened. Do not climb a closed stepladder. Make sure that both sides of the stepladder are locked into place. Ask someone to hold it for you, if possible. Clearly mark and make sure that you can see: Any grab bars or handrails. First and last steps. Where the edge of each step is. Use tools that help you move around (mobility aids) if they are needed. These include: Canes. Walkers. Scooters. Crutches. Turn on the lights when you go into a dark area. Replace any light bulbs as soon as they burn out. Set up your furniture so you have a clear path. Avoid moving your furniture around. If any of your floors are uneven, fix them. If there are any pets around you, be aware of where they are. Review your medicines with your doctor. Some medicines can make you feel dizzy. This can increase your chance of falling. Ask your doctor what other things that you can do to help prevent falls. This information is not intended to replace advice given to you by your health care provider. Make sure you discuss any questions you have with your health care provider. Document Released: 04/17/2009 Document Revised: 11/27/2015 Document Reviewed: 07/26/2014 Elsevier Interactive Patient Education  2017 [employer].

## 2023-02-02 ENCOUNTER — Encounter (INDEPENDENT_AMBULATORY_CARE_PROVIDER_SITE_OTHER): Payer: Self-pay

## 2023-02-03 DIAGNOSIS — H25811 Combined forms of age-related cataract, right eye: Secondary | ICD-10-CM | POA: Diagnosis not present

## 2023-02-03 DIAGNOSIS — H2511 Age-related nuclear cataract, right eye: Secondary | ICD-10-CM | POA: Diagnosis not present

## 2023-02-03 DIAGNOSIS — Z961 Presence of intraocular lens: Secondary | ICD-10-CM | POA: Diagnosis not present

## 2023-02-16 ENCOUNTER — Ambulatory Visit: Payer: Medicare PPO | Admitting: Internal Medicine

## 2023-02-22 ENCOUNTER — Other Ambulatory Visit: Payer: Self-pay | Admitting: Cardiovascular Disease

## 2023-02-24 ENCOUNTER — Ambulatory Visit: Payer: Medicare PPO | Admitting: Internal Medicine

## 2023-02-24 ENCOUNTER — Encounter: Payer: Self-pay | Admitting: Internal Medicine

## 2023-02-24 VITALS — BP 110/78 | HR 59 | Temp 98.3°F | Ht 66.0 in | Wt 163.0 lb

## 2023-02-24 DIAGNOSIS — N320 Bladder-neck obstruction: Secondary | ICD-10-CM

## 2023-02-24 DIAGNOSIS — R944 Abnormal results of kidney function studies: Secondary | ICD-10-CM

## 2023-02-24 DIAGNOSIS — U071 COVID-19: Secondary | ICD-10-CM | POA: Insufficient documentation

## 2023-02-24 DIAGNOSIS — G4733 Obstructive sleep apnea (adult) (pediatric): Secondary | ICD-10-CM | POA: Diagnosis not present

## 2023-02-24 DIAGNOSIS — E785 Hyperlipidemia, unspecified: Secondary | ICD-10-CM

## 2023-02-24 DIAGNOSIS — Z7984 Long term (current) use of oral hypoglycemic drugs: Secondary | ICD-10-CM

## 2023-02-24 DIAGNOSIS — E119 Type 2 diabetes mellitus without complications: Secondary | ICD-10-CM | POA: Diagnosis not present

## 2023-02-24 LAB — COMPREHENSIVE METABOLIC PANEL
ALT: 26 U/L (ref 0–53)
AST: 27 U/L (ref 0–37)
Albumin: 4.2 g/dL (ref 3.5–5.2)
Alkaline Phosphatase: 83 U/L (ref 39–117)
BUN: 13 mg/dL (ref 6–23)
CO2: 29 mEq/L (ref 19–32)
Calcium: 9.5 mg/dL (ref 8.4–10.5)
Chloride: 106 mEq/L (ref 96–112)
Creatinine, Ser: 1.13 mg/dL (ref 0.40–1.50)
GFR: 62.32 mL/min (ref >60.00)
Glucose, Bld: 132 mg/dL — ABNORMAL HIGH (ref 70–99)
Potassium: 4.8 mEq/L (ref 3.5–5.1)
Sodium: 141 mEq/L (ref 135–145)
Total Bilirubin: 0.6 mg/dL (ref 0.2–1.2)
Total Protein: 7 g/dL (ref 6.0–8.3)

## 2023-02-24 LAB — HEMOGLOBIN A1C: Hgb A1c MFr Bld: 6.6 % — ABNORMAL HIGH (ref 4.6–6.5)

## 2023-02-24 MED ORDER — FLUTICASONE PROPIONATE 50 MCG/ACT NA SUSP: 2.0000 | Freq: Every day | NASAL | 6 refills | Status: AC

## 2023-02-24 MED ORDER — TADALAFIL 5 MG PO TABS: 5.0000 mg | ORAL_TABLET | Freq: Every day | ORAL | 11 refills | Status: DC

## 2023-02-24 NOTE — Assessment & Plan Note (Signed)
Discussed renal and CV protection options Start Farxiga 

## 2023-02-24 NOTE — Assessment & Plan Note (Signed)
CPAP Try Flonase for mucus

## 2023-02-24 NOTE — Patient Instructions (Signed)
For a mild COVID-19 case - take zinc 50 mg a day for 1 week, vitamin C 1000 mg daily for 1 week, vitamin D2 50,000 units weekly for 2 months (unless  taking vitamin D daily already), an antioxidant Quercetin 500 mg twice a day for 1 week (if you can get it quick enough). Take Allegra or Benadryl.  Maintain good oral hydration and take Tylenol for high fever.  Call if problems. Isolate for 5 days, then wear a mask for 5 days per CDC.  

## 2023-02-24 NOTE — Assessment & Plan Note (Addendum)
Not interested in large surgery - Dr Robert Bellow is interested in a minimally invasive prostate surgery/procedure Will try daily Cialis - no NTG w/it.

## 2023-02-24 NOTE — Assessment & Plan Note (Signed)
Start on [facility]

## 2023-02-24 NOTE — Assessment & Plan Note (Signed)
Cont on Zetia, Lipitor, Niacin

## 2023-02-24 NOTE — Progress Notes (Signed)
Subjective:  Patient ID: Joshua Brown, male    DOB: 03/04/1946  Age: 77 y.o. MRN: [id number]  CC: Follow-up (3 [facility]TH F/U)   HPI Joshua Brown presents for BPH - worse, CAD, dyslipidemia  Outpatient Medications Prior to Visit  Medication Sig Dispense Refill   ALPRAZolam (XANAX) 0.5 MG tablet TAKE 1 TABLET AT BEDTIME AS NEEDED FOR ANXIETY 30 tablet 3   atorvastatin (LIPITOR) 80 MG tablet Take 1 tablet (80 mg total) by mouth daily. Please keep scheduled appointment for future refills. Thank you. 30 tablet 0   b complex vitamins capsule Take 1 capsule by mouth daily.       Cetirizine HCl (KLS ALLER-TEC PO) Take 1 tablet by mouth daily as needed (allergies).     Cholecalciferol (VITAMIN D) 2000 UNIT CAPS Take 2,000 Units by mouth daily.      clobetasol cream (TEMOVATE) 0.05 % Apply 1 application. topically 2 (two) times daily. 30 g 2   clopidogrel (PLAVIX) 75 MG tablet Take 1 tablet (75 mg total) by mouth daily. Please keep scheduled appointment for future refills. Thank you. 30 tablet 0   dapagliflozin propanediol (FARXIGA) 5 MG TABS tablet Take 1 tablet (5 mg total) by mouth daily before breakfast. 90 tablet 3   ezetimibe (ZETIA) 10 MG tablet Take 1 tablet (10 mg total) by mouth daily. Please keep scheduled appointment for future refills. Thank you. 30 tablet 0   losartan (COZAAR) 25 MG tablet Take 1 tablet (25 mg total) by mouth daily. Please keep scheduled appointment for future refills. Thank you. 30 tablet 0   metoprolol succinate (TOPROL-XL) 50 MG 24 hr tablet TAKE 1 TABLET BY MOUTH EVERY DAY WITH OR IMMEDIATELY AFTER A MEAL. Please keep scheduled appointment for future refills. Thank you. 30 tablet 0   niacin (NIASPAN) 500 MG CR tablet Take 2,000 mg by mouth at bedtime.     nitroGLYCERIN (NITROSTAT) 0.4 MG SL tablet PLACE 1 TABLET UNDER THE TONGUE EVERY 5 MINUTES AS NEEDED FOR CHEST PAIN. 25 tablet 3   pantoprazole (PROTONIX) 40 MG tablet Take 1 tablet (40 mg total) by mouth daily. 15  tablet 0   sildenafil (REVATIO) 20 MG tablet Take 2-5 tablets by mouth as needed prior to intercourse 90 tablet 1   naproxen (NAPROSYN) 500 MG tablet 1 tablet with food or milk as needed (Patient not taking: Reported on 11/30/2022)     No facility-administered medications prior to visit.    ROS: Review of Systems  Constitutional:  Negative for appetite change, fatigue and unexpected weight change.  HENT:  Negative for congestion, nosebleeds, sneezing, sore throat and trouble swallowing.   Eyes:  Negative for itching and visual disturbance.  Respiratory:  Negative for cough.   Cardiovascular:  Negative for chest pain, palpitations and leg swelling.  Gastrointestinal:  Negative for abdominal distention, blood in stool, diarrhea and nausea.  Genitourinary:  Positive for frequency and urgency. Negative for hematuria.  Musculoskeletal:  Negative for back pain, gait problem, joint swelling and neck pain.  Skin:  Negative for rash.  Neurological:  Negative for dizziness, tremors, speech difficulty and weakness.  Hematological:  Bruises/bleeds easily.  Psychiatric/Behavioral:  Negative for agitation, dysphoric mood and sleep disturbance. The patient is not nervous/anxious.     Objective:  BP 110/78 (BP Location: Left Arm, Patient Position: Sitting, Cuff Size: Large)   Pulse (!) 59   Temp 98.3 F (36.8 C) (Oral)   Ht 5\' 6"  (1.676 m)   Wt 163 lb (  73.9 kg)   SpO2 98%   BMI 26.31 kg/m   BP Readings from Last 3 Encounters:  02/24/23 110/78  12/24/22 (!) 98/53  11/24/22 112/74    Wt Readings from Last 3 Encounters:  02/24/23 163 lb (73.9 kg)  01/28/23 164 lb (74.4 kg)  12/24/22 164 lb (74.4 kg)    Physical Exam Constitutional:      General: He is not in acute distress.    Appearance: Normal appearance. He is well-developed.     Comments: NAD  Eyes:     Conjunctiva/sclera: Conjunctivae normal.     Pupils: Pupils are equal, round, and reactive to light.  Neck:     Thyroid: No  thyromegaly.     Vascular: No JVD.  Cardiovascular:     Rate and Rhythm: Normal rate and regular rhythm.     Heart sounds: Normal heart sounds. No murmur heard.    No friction rub. No gallop.  Pulmonary:     Effort: Pulmonary effort is normal. No respiratory distress.     Breath sounds: Normal breath sounds. No wheezing or rales.  Chest:     Chest wall: No tenderness.  Abdominal:     General: Bowel sounds are normal. There is no distension.     Palpations: Abdomen is soft. There is no mass.     Tenderness: There is no abdominal tenderness. There is no guarding or rebound.  Musculoskeletal:        General: No tenderness. Normal range of motion.     Cervical back: Normal range of motion.  Lymphadenopathy:     Cervical: No cervical adenopathy.  Skin:    General: Skin is warm and dry.     Findings: No rash.  Neurological:     Mental Status: He is alert and oriented to person, place, and time.     Cranial Nerves: No cranial nerve deficit.     Motor: No abnormal muscle tone.     Coordination: Coordination normal.     Gait: Gait normal.     Deep Tendon Reflexes: Reflexes are normal and symmetric.  Psychiatric:        Behavior: Behavior normal.        Thought Content: Thought content normal.        Judgment: Judgment normal.     Lab Results  Component Value Date   WBC 6.9 08/04/2022   HGB 14.8 08/04/2022   HCT 44.2 08/04/2022   PLT 139.0 (L) 08/04/2022   GLUCOSE 121 (H) 11/02/2022   CHOL 117 08/04/2022   TRIG 66.0 08/04/2022   HDL 43.40 08/04/2022   LDLCALC 60 08/04/2022   ALT 21 11/02/2022   AST 28 11/02/2022   NA 139 11/02/2022   K 4.6 11/02/2022   CL 104 11/02/2022   CREATININE 1.19 11/02/2022   BUN 12 11/02/2022   CO2 29 11/02/2022   TSH 1.60 08/04/2022   PSA 0.54 08/04/2022   INR 1.1 RATIO (H) 05/16/2008   HGBA1C 6.4 11/02/2022    No results found.  Assessment & Plan:   Problem List Items Addressed This Visit     Dyslipidemia    Cont on Zetia,  Lipitor, Niacin      Obstructive sleep apnea    CPAP Try Flonase for mucus      Bladder neck obstruction    Not interested in large surgery - Dr Robert Bellow is interested in a minimally invasive prostate surgery/procedure Will try daily Cialis - no NTG w/it.  Relevant Orders   Ambulatory referral to Urology   Decreased GFR    Discussed renal and CV protection options Start Farxiga      Diabetes mellitus type 2 in nonobese (HCC)    Start on Farxiga Rx      COVID-19 - Primary      Meds ordered this encounter  Medications   tadalafil (CIALIS) 5 MG tablet    Sig: Take 1 tablet (5 mg total) by mouth daily.    Dispense:  30 tablet    Refill:  11   fluticasone (FLONASE) 50 MCG/ACT nasal spray    Sig: Place 2 sprays into both nostrils daily.    Dispense:  16 g    Refill:  6      Follow-up: Return in about 3 months (around 05/27/2023).  Sonda Primes, MD

## 2023-02-28 ENCOUNTER — Other Ambulatory Visit: Payer: Self-pay | Admitting: Cardiovascular Disease

## 2023-02-28 ENCOUNTER — Encounter: Payer: Self-pay | Admitting: Cardiovascular Disease

## 2023-02-28 ENCOUNTER — Ambulatory Visit: Payer: Medicare PPO | Attending: Cardiovascular Disease | Admitting: Cardiovascular Disease

## 2023-02-28 VITALS — BP 110/60 | HR 74 | Ht 66.0 in | Wt 160.8 lb

## 2023-02-28 DIAGNOSIS — E782 Mixed hyperlipidemia: Secondary | ICD-10-CM

## 2023-02-28 DIAGNOSIS — I10 Essential (primary) hypertension: Secondary | ICD-10-CM

## 2023-02-28 DIAGNOSIS — I2510 Atherosclerotic heart disease of native coronary artery without angina pectoris: Secondary | ICD-10-CM

## 2023-02-28 DIAGNOSIS — R011 Cardiac murmur, unspecified: Secondary | ICD-10-CM

## 2023-02-28 DIAGNOSIS — I739 Peripheral vascular disease, unspecified: Secondary | ICD-10-CM | POA: Diagnosis not present

## 2023-02-28 NOTE — Patient Instructions (Signed)
Medication Instructions:  Your physician recommends that you continue on your current medications as directed. Please refer to the Current Medication list given to you today.  *If you need a refill on your cardiac medications before your next appointment, please call your pharmacy*  Lab Work: NONE If you have labs (blood work) drawn today and your tests are completely normal, you will receive your results only by: MyChart Message (if you have MyChart) OR A paper copy in the mail If you have any lab test that is abnormal or we need to change your treatment, we will call you to review the results.  Testing/Procedures: ECHO (prior to next year's visit) Your physician has requested that you have an echocardiogram. Echocardiography is a painless test that uses sound waves to create images of your heart. It provides your doctor with information about the size and shape of your heart and how well your heart's chambers and valves are working. This procedure takes approximately one hour. There are no restrictions for this procedure. Please do NOT wear cologne, perfume, aftershave, or lotions (deodorant is allowed). Please arrive 15 minutes prior to your appointment time.  Follow-Up: At [facility], you and your health needs are our priority.  As part of our continuing mission to provide you with exceptional heart care, we have created designated Provider Care Teams.  These Care Teams include your primary Cardiologist (physician) and Advanced Practice Providers (APPs -  Physician Assistants and Nurse Practitioners) who all work together to provide you with the care you need, when you need it.  Your next appointment:   1 year(s)  Provider:   Tonny Bollman, MD

## 2023-02-28 NOTE — Progress Notes (Signed)
Cardiology Office Note:    Date:  02/28/2023   ID:  [name], [name] February 22, 1946, MRN [id number]  PCP:  Tresa Garter, MD    HeartCare Providers Cardiologist:  Tonny Bollman, MD     Referring MD: Tresa Garter, MD   Chief Complaint  Patient presents with   Coronary Artery Disease    History of Present Illness:    Joshua Brown is a 77 y.o. male with a hx of coronary artery disease with remote MI at age 69.  The patient underwent multivessel CABG at age 38.  He is followed for peripheral arterial disease with intermittent claudication secondary to distal aortic and bilateral iliac stenoses.    The patient is here alone today.  He has been doing well from a cardiac perspective.  He denies any chest pain, chest pressure, shortness of breath, or heart palpitations.  He continues to do some aerobic exercise with walking on a treadmill without exertional symptoms.  He has been maintained on long-term clopidogrel in the setting of his extensive premature CAD.  The patient's most recent stress Myoview scan and February 2023 showed normal LVEF of 59% and no ischemia.  Past Medical History:  Diagnosis Date   ABDOMINAL AORTIC ANEURYSM 11/12/2008   has annual U/s   Atypical mole 03/08/2018   mild atypia on R lower abdomen - no treatment   CAD (coronary artery disease)    premature   DDD (degenerative disc disease)    ED (erectile dysfunction)    Frequent urination at night    nocturia 2-3   GERD (gastroesophageal reflux disease)    Hyperglycemia    Borderline   Hyperlipidemia    Atherogenic   Hypertension    Myocardial infarct, old    Obesity    Class I   PVD (peripheral vascular disease) (HCC)    ABI June '12 -0.9 bilateral.    Past Surgical History:  Procedure Laterality Date   CATARACT EXTRACTION Left 01/10/2023   colonoscopy     one month ago   COLONOSCOPY     CORONARY ARTERY BYPASS GRAFT     TONSILLECTOMY     age 19   UPPER GASTROINTESTINAL  ENDOSCOPY      Current Medications: Current Meds  Medication Sig   ALPRAZolam (XANAX) 0.5 MG tablet TAKE 1 TABLET AT BEDTIME AS NEEDED FOR ANXIETY   atorvastatin (LIPITOR) 80 MG tablet Take 1 tablet (80 mg total) by mouth daily. Please keep scheduled appointment for future refills. Thank you.   b complex vitamins capsule Take 1 capsule by mouth daily.     Cetirizine HCl (KLS ALLER-TEC PO) Take 1 tablet by mouth daily as needed (allergies).   Cholecalciferol (VITAMIN D) 2000 UNIT CAPS Take 2,000 Units by mouth daily.    clobetasol cream (TEMOVATE) 0.05 % Apply 1 application. topically 2 (two) times daily.   clopidogrel (PLAVIX) 75 MG tablet Take 1 tablet (75 mg total) by mouth daily. Please keep scheduled appointment for future refills. Thank you.   dapagliflozin propanediol (FARXIGA) 5 MG TABS tablet Take 1 tablet (5 mg total) by mouth daily before breakfast.   ezetimibe (ZETIA) 10 MG tablet Take 1 tablet (10 mg total) by mouth daily. Please keep scheduled appointment for future refills. Thank you.   fluticasone (FLONASE) 50 MCG/ACT nasal spray Place 2 sprays into both nostrils daily.   losartan (COZAAR) 25 MG tablet Take 1 tablet (25 mg total) by mouth daily. Please keep scheduled appointment for future  refills. Thank you.   metoprolol succinate (TOPROL-XL) 50 MG 24 hr tablet TAKE 1 TABLET BY MOUTH EVERY DAY WITH OR IMMEDIATELY AFTER A MEAL. Please keep scheduled appointment for future refills. Thank you.   naproxen (NAPROSYN) 500 MG tablet    niacin (NIASPAN) 500 MG CR tablet Take 2,000 mg by mouth at bedtime.   nitroGLYCERIN (NITROSTAT) 0.4 MG SL tablet PLACE 1 TABLET UNDER THE TONGUE EVERY 5 MINUTES AS NEEDED FOR CHEST PAIN.   pantoprazole (PROTONIX) 40 MG tablet Take 1 tablet (40 mg total) by mouth daily.   tadalafil (CIALIS) 5 MG tablet Take 1 tablet (5 mg total) by mouth daily.     Allergies:   Patient has no known allergies.   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   Occupation: retired  Tobacco Use   Smoking status: Former    Current packs/day: 0.00    Average packs/day: 1 pack/day for 16.0 years (16.0 ttl pk-yrs)    Types: Cigarettes    Start date: 07/05/1953    Quit date: 07/05/1969    Years since quitting: 53.6   Smokeless tobacco: Never  Vaping Use   Vaping status: Never Used  Substance and Sexual Activity   Alcohol use: Yes    Alcohol/week: 2.0 standard drinks of alcohol    Types: 2 Standard drinks or equivalent per week    Comment: socially   Drug use: No   Sexual activity: Yes    Partners: Female  Other Topics Concern   Not on file  Social History Narrative   HSG, [employer] 1 year. Married 1977. 1 son - 4; 2 daughters - 21, 2; 3  Grandchildren. Work - self - employed Fish farm manager. Marriage in good health. ACP - discussed. Provided packet May '13.   Social Determinants of Health   Financial Resource Strain: Low Risk  (01/28/2023)   Overall Financial Resource Strain (CARDIA)    Difficulty of Paying Living Expenses: Not hard at all  Food Insecurity: No Food Insecurity (01/28/2023)   Hunger Vital Sign    Worried About Running Out of Food in the Last Year: Never true    Ran Out of Food in the Last Year: Never true  Transportation Needs: No Transportation Needs (01/28/2023)   PRAPARE - Administrator, Civil Service (Medical): No    Lack of Transportation (Non-Medical): No  Physical Activity: Sufficiently Active (01/28/2023)   Exercise Vital Sign    Days of Exercise per Week: 3 days    Minutes of Exercise per Session: 60 min  Recent Concern: Physical Activity - Insufficiently Active (11/01/2022)   Exercise Vital Sign    Days of Exercise per Week: 2  days    Minutes of Exercise per Session: 60 min  Stress: Stress Concern Present (01/28/2023)   [employer] of Occupational Health - Occupational Stress Questionnaire    Feeling of Stress : To some extent  Social Connections: Socially Integrated (01/28/2023)   Social Connection and Isolation Panel [NHANES]    Frequency of Communication with Friends and Family: More than three times a week    Frequency of Social Gatherings with Friends and Family: More than  three times a week    Attends Religious Services: 1 to 4 times per year    Active Member of Clubs or Organizations: Yes    Attends Engineer, structural: More than 4 times per year    Marital Status: Married     Family History: The patient's family history includes CAD in his father and paternal grandfather; Cancer in his mother; Congestive Heart Failure in his mother; Early death in his father; Heart attack in his father and paternal grandfather; Heart disease in his father and paternal grandfather; Hyperlipidemia in his mother; Prostate cancer in his brother. There is no history of Esophageal cancer, Stomach cancer, Colon cancer, Colon polyps, or Rectal cancer.  ROS:   Please see the history of present illness.    All other systems reviewed and are negative.  EKGs/Labs/Other Studies Reviewed:    The following studies were reviewed today: EKG Interpretation Date/Time:  Monday February 28 2023 09:35:34 EDT Ventricular Rate:  74 PR Interval:  150 QRS Duration:  100 QT Interval:  400 QTC Calculation: 444 R Axis:   12  Text Interpretation: Normal sinus rhythm Possible Inferior infarct (cited on or before 19-Aug-2021) When compared with ECG of 19-Aug-2021 09:17, ST no longer depressed in Inferior leads ST no longer depressed in Lateral leads Confirmed by Tonny Bollman [id number]) on 02/28/2023 9:56:11 AM    Recent Labs: 08/04/2022: Hemoglobin 14.8; Platelets 139.0; TSH 1.60 02/24/2023: ALT 26; BUN 13; Creatinine, Ser 1.13;  Potassium 4.8; Sodium 141  Recent Lipid Panel    Component Value Date/Time   CHOL 117 08/04/2022 0955   CHOL 120 06/02/2020 0740   TRIG 66.0 08/04/2022 0955   HDL 43.40 08/04/2022 0955   HDL 41 06/02/2020 0740   CHOLHDL 3 08/04/2022 0955   VLDL 13.2 08/04/2022 0955   LDLCALC 60 08/04/2022 0955   LDLCALC 67 06/02/2020 0740     Risk Assessment/Calculations:                Physical Exam:    VS:  BP 110/60   Pulse 74   Ht 5\' 6"  (1.676 m)   Wt 160 lb 12.8 oz (72.9 kg)   SpO2 98%   BMI 25.95 kg/m     Wt Readings from Last 3 Encounters:  02/28/23 160 lb 12.8 oz (72.9 kg)  02/24/23 163 lb (73.9 kg)  01/28/23 164 lb (74.4 kg)     GEN:  Well nourished, well developed in no acute distress HEENT: Normal NECK: No JVD; No carotid bruits LYMPHATICS: No lymphadenopathy CARDIAC: RRR, 2/6 systolic murmur at the right upper sternal border RESPIRATORY:  Clear to auscultation without rales, wheezing or rhonchi  ABDOMEN: Soft, non-tender, non-distended MUSCULOSKELETAL:  No edema; No deformity  SKIN: Warm and dry NEUROLOGIC:  Alert and oriented x 3 PSYCHIATRIC:  Normal affect   ASSESSMENT:    1. Essential hypertension   2. Coronary artery disease involving native coronary artery of native heart without angina pectoris   3. PAD (peripheral artery disease) (HCC)   4. Mixed hyperlipidemia   5. Heart murmur    PLAN:    In order of problems listed above:  Blood pressure is well-controlled on losartan and metoprolol succinate.  No medication changes are made today.  Most recent labs are reviewed with a creatinine of 1.19. The patient continues to do well with no symptoms of angina.  His stress Myoview from 2023 is reviewed and this demonstrated no ischemia.  He will continue on atorvastatin, clopidogrel, beta-blocker, and  an ARB. Remains clinically stable.  Noted to have aortic atherosclerosis.  Patient on an aggressive medical regimen as outlined above. Treated with a high  intensity statin drug and Zetia.  Lipids reviewed with an LDL cholesterol of 60 mg/dL. The patient has a mid peaking crescendo decrescendo murmur heard at the right upper sternal border and apical region consistent with mild aortic stenosis.  He was noted to have aortic sclerosis previously.  I recommended an echocardiogram prior to his office visit next year.           Medication Adjustments/Labs and Tests Ordered: Current medicines are reviewed at length with the patient today.  Concerns regarding medicines are outlined above.  Orders Placed This Encounter  Procedures   EKG 12-Lead   ECHOCARDIOGRAM COMPLETE   No orders of the defined types were placed in this encounter.   Patient Instructions  Medication Instructions:  Your physician recommends that you continue on your current medications as directed. Please refer to the Current Medication list given to you today.  *If you need a refill on your cardiac medications before your next appointment, please call your pharmacy*  Lab Work: NONE If you have labs (blood work) drawn today and your tests are completely normal, you will receive your results only by: MyChart Message (if you have MyChart) OR A paper copy in the mail If you have any lab test that is abnormal or we need to change your treatment, we will call you to review the results.  Testing/Procedures: ECHO (prior to next year's visit) Your physician has requested that you have an echocardiogram. Echocardiography is a painless test that uses sound waves to create images of your heart. It provides your doctor with information about the size and shape of your heart and how well your heart's chambers and valves are working. This procedure takes approximately one hour. There are no restrictions for this procedure. Please do NOT wear cologne, perfume, aftershave, or lotions (deodorant is allowed). Please arrive 15 minutes prior to your appointment time.  Follow-Up: At [facility], you and your health needs are our priority.  As part of our continuing mission to provide you with exceptional heart care, we have created designated Provider Care Teams.  These Care Teams include your primary Cardiologist (physician) and Advanced Practice Providers (APPs -  Physician Assistants and Nurse Practitioners) who all work together to provide you with the care you need, when you need it.  Your next appointment:   1 year(s)  Provider:   Tonny Bollman, MD        Signed, Tonny Bollman, MD  02/28/2023 11:32 AM    [facility]

## 2023-03-01 MED ORDER — CLOPIDOGREL BISULFATE 75 MG PO TABS: 75.0000 mg | ORAL_TABLET | Freq: Every day | ORAL | 3 refills | Status: DC

## 2023-03-01 MED ORDER — PANTOPRAZOLE SODIUM 40 MG PO TBEC: 40.0000 mg | DELAYED_RELEASE_TABLET | Freq: Every day | ORAL | 3 refills | Status: DC

## 2023-03-01 MED ORDER — EZETIMIBE 10 MG PO TABS: 10.0000 mg | ORAL_TABLET | Freq: Every day | ORAL | 3 refills | Status: DC

## 2023-03-01 MED ORDER — LOSARTAN POTASSIUM 25 MG PO TABS: 25.0000 mg | ORAL_TABLET | Freq: Every day | ORAL | 3 refills | Status: DC

## 2023-03-01 MED ORDER — ATORVASTATIN CALCIUM 80 MG PO TABS: 80.0000 mg | ORAL_TABLET | Freq: Every day | ORAL | 3 refills | Status: DC

## 2023-03-01 MED ORDER — METOPROLOL SUCCINATE ER 50 MG PO TB24: ORAL_TABLET | ORAL | 3 refills | Status: DC

## 2023-03-01 NOTE — Telephone Encounter (Signed)
Per OV note by Excell Seltzer on 02/28/23: Blood pressure is well-controlled on losartan and metoprolol succinate. No medication changes are made today.  He will continue on atorvastatin, clopidogrel, beta-blocker, and an ARB.   Will send annual refills to pharmacy.

## 2023-03-23 ENCOUNTER — Other Ambulatory Visit: Payer: Self-pay | Admitting: Cardiovascular Disease

## 2023-03-29 DIAGNOSIS — D2261 Melanocytic nevi of right upper limb, including shoulder: Secondary | ICD-10-CM | POA: Diagnosis not present

## 2023-03-29 DIAGNOSIS — L821 Other seborrheic keratosis: Secondary | ICD-10-CM | POA: Diagnosis not present

## 2023-03-29 DIAGNOSIS — D692 Other nonthrombocytopenic purpura: Secondary | ICD-10-CM | POA: Diagnosis not present

## 2023-03-29 DIAGNOSIS — D2272 Melanocytic nevi of left lower limb, including hip: Secondary | ICD-10-CM | POA: Diagnosis not present

## 2023-03-29 DIAGNOSIS — D485 Neoplasm of uncertain behavior of skin: Secondary | ICD-10-CM | POA: Diagnosis not present

## 2023-03-29 DIAGNOSIS — L570 Actinic keratosis: Secondary | ICD-10-CM | POA: Diagnosis not present

## 2023-03-29 DIAGNOSIS — B353 Tinea pedis: Secondary | ICD-10-CM | POA: Diagnosis not present

## 2023-03-29 DIAGNOSIS — L603 Nail dystrophy: Secondary | ICD-10-CM | POA: Diagnosis not present

## 2023-03-29 DIAGNOSIS — D2262 Melanocytic nevi of left upper limb, including shoulder: Secondary | ICD-10-CM | POA: Diagnosis not present

## 2023-03-29 DIAGNOSIS — L218 Other seborrheic dermatitis: Secondary | ICD-10-CM | POA: Diagnosis not present

## 2023-05-04 ENCOUNTER — Ambulatory Visit: Payer: Medicare PPO | Admitting: Internal Medicine

## 2023-05-25 DIAGNOSIS — N401 Enlarged prostate with lower urinary tract symptoms: Secondary | ICD-10-CM | POA: Diagnosis not present

## 2023-05-25 DIAGNOSIS — R350 Frequency of micturition: Secondary | ICD-10-CM | POA: Diagnosis not present

## 2023-05-25 DIAGNOSIS — R351 Nocturia: Secondary | ICD-10-CM | POA: Diagnosis not present

## 2023-05-25 DIAGNOSIS — R3915 Urgency of urination: Secondary | ICD-10-CM | POA: Diagnosis not present

## 2023-05-25 DIAGNOSIS — N5201 Erectile dysfunction due to arterial insufficiency: Secondary | ICD-10-CM | POA: Diagnosis not present

## 2023-05-30 ENCOUNTER — Encounter: Payer: Self-pay | Admitting: Internal Medicine

## 2023-05-30 ENCOUNTER — Ambulatory Visit: Payer: Medicare PPO | Admitting: Internal Medicine

## 2023-05-30 VITALS — BP 110/70 | HR 61 | Temp 97.7°F | Ht 66.0 in | Wt 159.0 lb

## 2023-05-30 DIAGNOSIS — F411 Generalized anxiety disorder: Secondary | ICD-10-CM

## 2023-05-30 DIAGNOSIS — N320 Bladder-neck obstruction: Secondary | ICD-10-CM | POA: Diagnosis not present

## 2023-05-30 DIAGNOSIS — I10 Essential (primary) hypertension: Secondary | ICD-10-CM | POA: Diagnosis not present

## 2023-05-30 DIAGNOSIS — Z7984 Long term (current) use of oral hypoglycemic drugs: Secondary | ICD-10-CM

## 2023-05-30 DIAGNOSIS — R944 Abnormal results of kidney function studies: Secondary | ICD-10-CM | POA: Diagnosis not present

## 2023-05-30 DIAGNOSIS — E119 Type 2 diabetes mellitus without complications: Secondary | ICD-10-CM | POA: Diagnosis not present

## 2023-05-30 LAB — HEMOGLOBIN A1C: Hgb A1c MFr Bld: 6.4 % (ref 4.6–6.5)

## 2023-05-30 LAB — COMPREHENSIVE METABOLIC PANEL
ALT: 20 U/L (ref 0–53)
AST: 26 U/L (ref 0–37)
Albumin: 4.5 g/dL (ref 3.5–5.2)
Alkaline Phosphatase: 80 U/L (ref 39–117)
BUN: 20 mg/dL (ref 6–23)
CO2: 27 meq/L (ref 19–32)
Calcium: 9.5 mg/dL (ref 8.4–10.5)
Chloride: 105 meq/L (ref 96–112)
Creatinine, Ser: 1.34 mg/dL (ref 0.40–1.50)
GFR: 50.7 mL/min — ABNORMAL LOW (ref >60.00)
Glucose, Bld: 123 mg/dL — ABNORMAL HIGH (ref 70–99)
Potassium: 4.7 meq/L (ref 3.5–5.1)
Sodium: 138 meq/L (ref 135–145)
Total Bilirubin: 0.5 mg/dL (ref 0.2–1.2)
Total Protein: 7.2 g/dL (ref 6.0–8.3)

## 2023-05-30 MED ORDER — TADALAFIL 5 MG PO TABS
5.0000 mg | ORAL_TABLET | Freq: Every day | ORAL | 3 refills | Status: DC
Start: 2023-05-30 — End: 2024-03-05

## 2023-05-30 NOTE — Assessment & Plan Note (Signed)
Cont on Losartan, Toprol, Marcelline Deist

## 2023-05-30 NOTE — Assessment & Plan Note (Signed)
Chronic  On alprazolam low dose prn  Potential benefits of a long term benzodiazepines  use as well as potential risks  and complications were explained to the patient and were aknowledged.

## 2023-05-30 NOTE — Assessment & Plan Note (Signed)
On Farxiga (urinary frequency was there before [place])

## 2023-05-30 NOTE — Progress Notes (Signed)
Subjective:  Patient ID: KCI BAES, male    DOB: 1946/05/18  Age: 77 y.o. MRN: [id number]  CC: Medical Management of Chronic Issues (3 mnth f/u, Discuss Medications Marcelline Deist and Gemtesa, Needs new orders for sleep apnea testing.)   HPI, DM, CAD JAIMON LOFGREEN presents for urinary sx's, CAD, DM  Outpatient Medications Prior to Visit  Medication Sig Dispense Refill   ALPRAZolam (XANAX) 0.5 MG tablet TAKE 1 TABLET AT BEDTIME AS NEEDED FOR ANXIETY 30 tablet 3   atorvastatin (LIPITOR) 80 MG tablet Take 1 tablet (80 mg total) by mouth daily. 90 tablet 3   b complex vitamins capsule Take 1 capsule by mouth daily.       Cetirizine HCl (KLS ALLER-TEC PO) Take 1 tablet by mouth daily as needed (allergies).     Cholecalciferol (VITAMIN D) 2000 UNIT CAPS Take 2,000 Units by mouth daily.      clobetasol cream (TEMOVATE) 0.05 % Apply 1 application. topically 2 (two) times daily. 30 g 2   clopidogrel (PLAVIX) 75 MG tablet Take 1 tablet (75 mg total) by mouth daily. 90 tablet 3   dapagliflozin propanediol (FARXIGA) 5 MG TABS tablet Take 1 tablet (5 mg total) by mouth daily before breakfast. 90 tablet 3   ezetimibe (ZETIA) 10 MG tablet TAKE 1 TABLET BY MOUTH DAILY. PLEASE KEEP SCHEDULED APPOINTMENT FOR FUTURE REFILLS. THANK YOU. 30 tablet 10   fluticasone (FLONASE) 50 MCG/ACT nasal spray Place 2 sprays into both nostrils daily. 16 g 6   losartan (COZAAR) 25 MG tablet TAKE 1 TABLET BY MOUTH EVERY DAY 30 tablet 10   metoprolol succinate (TOPROL-XL) 50 MG 24 hr tablet TAKE 1 TABLET BY MOUTH EVERY DAY WITH OR IMMEDIATELY AFTER A MEAL. 30 tablet 10   naproxen (NAPROSYN) 500 MG tablet      niacin (NIASPAN) 500 MG CR tablet Take 2,000 mg by mouth at bedtime.     nitroGLYCERIN (NITROSTAT) 0.4 MG SL tablet PLACE 1 TABLET UNDER THE TONGUE EVERY 5 MINUTES AS NEEDED FOR CHEST PAIN. 25 tablet 3   pantoprazole (PROTONIX) 40 MG tablet Take 1 tablet (40 mg total) by mouth daily. 90 tablet 3   tadalafil (CIALIS) 5 MG  tablet Take 1 tablet (5 mg total) by mouth daily. 30 tablet 11   No facility-administered medications prior to visit.    ROS: Review of Systems  Constitutional:  Negative for appetite change, fatigue and unexpected weight change.  HENT:  Negative for congestion, nosebleeds, sneezing, sore throat and trouble swallowing.   Eyes:  Negative for itching and visual disturbance.  Respiratory:  Negative for cough.   Cardiovascular:  Negative for chest pain, palpitations and leg swelling.  Gastrointestinal:  Negative for abdominal distention, blood in stool, diarrhea and nausea.  Genitourinary:  Negative for frequency and hematuria.  Musculoskeletal:  Negative for back pain, gait problem, joint swelling and neck pain.  Skin:  Negative for rash.  Neurological:  Negative for dizziness, tremors, speech difficulty and weakness.  Psychiatric/Behavioral:  Negative for agitation, dysphoric mood and sleep disturbance. The patient is not nervous/anxious.     Objective:  BP 110/70 (BP Location: Left Arm, Patient Position: Sitting, Cuff Size: Normal)   Pulse 61   Temp 97.7 F (36.5 C) (Oral)   Ht 5\' 6"  (1.676 m)   Wt 159 lb (72.1 kg)   SpO2 97%   BMI 25.66 kg/m   BP Readings from Last 3 Encounters:  05/30/23 110/70  02/28/23 110/60  02/24/23 110/78  Wt Readings from Last 3 Encounters:  05/30/23 159 lb (72.1 kg)  02/28/23 160 lb 12.8 oz (72.9 kg)  02/24/23 163 lb (73.9 kg)    Physical Exam Constitutional:      General: He is not in acute distress.    Appearance: He is well-developed.     Comments: NAD  Eyes:     Conjunctiva/sclera: Conjunctivae normal.     Pupils: Pupils are equal, round, and reactive to light.  Neck:     Thyroid: No thyromegaly.     Vascular: No JVD.  Cardiovascular:     Rate and Rhythm: Normal rate and regular rhythm.     Heart sounds: Normal heart sounds. No murmur heard.    No friction rub. No gallop.  Pulmonary:     Effort: Pulmonary effort is normal. No  respiratory distress.     Breath sounds: Normal breath sounds. No wheezing or rales.  Chest:     Chest wall: No tenderness.  Abdominal:     General: Bowel sounds are normal. There is no distension.     Palpations: Abdomen is soft. There is no mass.     Tenderness: There is no abdominal tenderness. There is no guarding or rebound.  Musculoskeletal:        General: No tenderness. Normal range of motion.     Cervical back: Normal range of motion.  Lymphadenopathy:     Cervical: No cervical adenopathy.  Skin:    General: Skin is warm and dry.     Findings: No rash.  Neurological:     Mental Status: He is alert and oriented to person, place, and time.     Cranial Nerves: No cranial nerve deficit.     Motor: No abnormal muscle tone.     Coordination: Coordination normal.     Gait: Gait normal.     Deep Tendon Reflexes: Reflexes are normal and symmetric.  Psychiatric:        Behavior: Behavior normal.        Thought Content: Thought content normal.        Judgment: Judgment normal.     Lab Results  Component Value Date   WBC 6.9 08/04/2022   HGB 14.8 08/04/2022   HCT 44.2 08/04/2022   PLT 139.0 (L) 08/04/2022   GLUCOSE 132 (H) 02/24/2023   CHOL 117 08/04/2022   TRIG 66.0 08/04/2022   HDL 43.40 08/04/2022   LDLCALC 60 08/04/2022   ALT 26 02/24/2023   AST 27 02/24/2023   NA 141 02/24/2023   K 4.8 02/24/2023   CL 106 02/24/2023   CREATININE 1.13 02/24/2023   BUN 13 02/24/2023   CO2 29 02/24/2023   TSH 1.60 08/04/2022   PSA 0.54 08/04/2022   INR 1.1 RATIO (H) 05/16/2008   HGBA1C 6.6 (H) 02/24/2023    No results found.  Assessment & Plan:   Problem List Items Addressed This Visit     Essential hypertension    Cont on Losartan, Toprol, Farxiga      Relevant Medications   tadalafil (CIALIS) 5 MG tablet   Bladder neck obstruction    On Farxiga (urinary frequency was there before [place])      Generalized anxiety disorder    Chronic  On alprazolam low dose  prn  Potential benefits of a long term benzodiazepines  use as well as potential risks  and complications were explained to the patient and were aknowledged.      Decreased GFR - Primary  On Farxiga (urinary frequency was there before [place])      Relevant Orders   Hemoglobin A1c   Comprehensive metabolic panel   Diabetes mellitus type 2 in nonobese [facility]    On Farxiga (urinary frequency was there before [place])      Relevant Orders   Hemoglobin A1c   Comprehensive metabolic panel      Meds ordered this encounter  Medications   tadalafil (CIALIS) 5 MG tablet    Sig: Take 1 tablet (5 mg total) by mouth daily.    Dispense:  90 tablet    Refill:  3      Follow-up: No follow-ups on file.  Sonda Primes, MD

## 2023-07-08 DIAGNOSIS — N401 Enlarged prostate with lower urinary tract symptoms: Secondary | ICD-10-CM | POA: Diagnosis not present

## 2023-07-08 DIAGNOSIS — R3915 Urgency of urination: Secondary | ICD-10-CM | POA: Diagnosis not present

## 2023-07-08 DIAGNOSIS — R350 Frequency of micturition: Secondary | ICD-10-CM | POA: Diagnosis not present

## 2023-07-08 DIAGNOSIS — N5201 Erectile dysfunction due to arterial insufficiency: Secondary | ICD-10-CM | POA: Diagnosis not present

## 2023-07-08 DIAGNOSIS — R351 Nocturia: Secondary | ICD-10-CM | POA: Diagnosis not present

## 2023-07-17 ENCOUNTER — Encounter: Payer: Self-pay | Admitting: Cardiovascular Disease

## 2023-09-27 ENCOUNTER — Encounter: Payer: Self-pay | Admitting: Internal Medicine

## 2023-09-27 ENCOUNTER — Ambulatory Visit: Payer: Medicare PPO | Admitting: Internal Medicine

## 2023-09-27 VITALS — BP 110/70 | HR 58 | Temp 97.9°F | Ht 66.0 in | Wt 157.0 lb

## 2023-09-27 DIAGNOSIS — F411 Generalized anxiety disorder: Secondary | ICD-10-CM | POA: Diagnosis not present

## 2023-09-27 DIAGNOSIS — F09 Unspecified mental disorder due to known physiological condition: Secondary | ICD-10-CM

## 2023-09-27 DIAGNOSIS — E785 Hyperlipidemia, unspecified: Secondary | ICD-10-CM | POA: Diagnosis not present

## 2023-09-27 DIAGNOSIS — I2583 Coronary atherosclerosis due to lipid rich plaque: Secondary | ICD-10-CM

## 2023-09-27 DIAGNOSIS — I10 Essential (primary) hypertension: Secondary | ICD-10-CM

## 2023-09-27 DIAGNOSIS — E119 Type 2 diabetes mellitus without complications: Secondary | ICD-10-CM | POA: Diagnosis not present

## 2023-09-27 DIAGNOSIS — Z7984 Long term (current) use of oral hypoglycemic drugs: Secondary | ICD-10-CM | POA: Diagnosis not present

## 2023-09-27 DIAGNOSIS — I2510 Atherosclerotic heart disease of native coronary artery without angina pectoris: Secondary | ICD-10-CM

## 2023-09-27 DIAGNOSIS — R944 Abnormal results of kidney function studies: Secondary | ICD-10-CM

## 2023-09-27 LAB — URINALYSIS
Bilirubin Urine: NEGATIVE
Hgb urine dipstick: NEGATIVE
Ketones, ur: NEGATIVE
Leukocytes,Ua: NEGATIVE
Nitrite: NEGATIVE
Specific Gravity, Urine: <=1.005 — AB (ref 1.000–1.030)
Total Protein, Urine: NEGATIVE
Urine Glucose: 250 — AB
Urobilinogen, UA: 0.2 (ref 0.0–1.0)
pH: 6 (ref 5.0–8.0)

## 2023-09-27 LAB — COMPREHENSIVE METABOLIC PANEL
ALT: 24 U/L (ref 0–53)
AST: 26 U/L (ref 0–37)
Albumin: 4.7 g/dL (ref 3.5–5.2)
Alkaline Phosphatase: 83 U/L (ref 39–117)
BUN: 17 mg/dL (ref 6–23)
CO2: 30 meq/L (ref 19–32)
Calcium: 10 mg/dL (ref 8.4–10.5)
Chloride: 101 meq/L (ref 96–112)
Creatinine, Ser: 1.15 mg/dL (ref 0.40–1.50)
GFR: 60.77 mL/min (ref >60.00)
Glucose, Bld: 117 mg/dL — ABNORMAL HIGH (ref 70–99)
Potassium: 4.6 meq/L (ref 3.5–5.1)
Sodium: 138 meq/L (ref 135–145)
Total Bilirubin: 0.6 mg/dL (ref 0.2–1.2)
Total Protein: 7.4 g/dL (ref 6.0–8.3)

## 2023-09-27 LAB — MICROALBUMIN / CREATININE URINE RATIO
Creatinine,U: 18.7 mg/dL
Microalb Creat Ratio: UNDETERMINED mg/g (ref 0.0–30.0)
Microalb, Ur: -0.1 mg/dL — ABNORMAL LOW (ref 0.0–1.9)

## 2023-09-27 LAB — HEMOGLOBIN A1C: Hgb A1c MFr Bld: 6.3 % (ref 4.6–6.5)

## 2023-09-27 MED ORDER — DAPAGLIFLOZIN PROPANEDIOL 5 MG PO TABS: 5.0000 mg | ORAL_TABLET | Freq: Every day | ORAL | 3 refills | Status: AC

## 2023-09-27 NOTE — Assessment & Plan Note (Signed)
Cont on Lipitor, Plavix, Losartan, Toprol, Zetia Niacin 

## 2023-09-27 NOTE — Assessment & Plan Note (Signed)
 Stable

## 2023-09-27 NOTE — Assessment & Plan Note (Signed)
Cont on Zetia, Lipitor, Niacin

## 2023-09-27 NOTE — Progress Notes (Signed)
 Subjective:  Patient ID: Joshua Brown, male    DOB: [birth year]  Age: 78 y.o. MRN: [id number]  CC: Medical Management of Chronic Issues (4 Month follow up. Fasting labs. Urine ACR)   HPI Joshua Brown presents for DM, CAD, HTN, CRI  Outpatient Medications Prior to Visit  Medication Sig Dispense Refill   ALPRAZolam (XANAX) 0.5 MG tablet TAKE 1 TABLET AT BEDTIME AS NEEDED FOR ANXIETY 30 tablet 3   atorvastatin (LIPITOR) 80 MG tablet Take 1 tablet (80 mg total) by mouth daily. 90 tablet 3   b complex vitamins capsule Take 1 capsule by mouth daily.       Cetirizine HCl (KLS ALLER-TEC PO) Take 1 tablet by mouth daily as needed (allergies).     Cholecalciferol (VITAMIN D) 2000 UNIT CAPS Take 2,000 Units by mouth daily.      clobetasol cream (TEMOVATE) 0.05 % Apply 1 application. topically 2 (two) times daily. 30 g 2   clopidogrel (PLAVIX) 75 MG tablet Take 1 tablet (75 mg total) by mouth daily. 90 tablet 3   ezetimibe (ZETIA) 10 MG tablet TAKE 1 TABLET BY MOUTH DAILY. PLEASE KEEP SCHEDULED APPOINTMENT FOR FUTURE REFILLS. THANK YOU. 30 tablet 10   fluticasone (FLONASE) 50 MCG/ACT nasal spray Place 2 sprays into both nostrils daily. 16 g 6   losartan (COZAAR) 25 MG tablet TAKE 1 TABLET BY MOUTH EVERY DAY 30 tablet 10   metoprolol succinate (TOPROL-XL) 50 MG 24 hr tablet TAKE 1 TABLET BY MOUTH EVERY DAY WITH OR IMMEDIATELY AFTER A MEAL. 30 tablet 10   naproxen (NAPROSYN) 500 MG tablet      niacin (NIASPAN) 500 MG CR tablet Take 2,000 mg by mouth at bedtime.     nitroGLYCERIN (NITROSTAT) 0.4 MG SL tablet PLACE 1 TABLET UNDER THE TONGUE EVERY 5 MINUTES AS NEEDED FOR CHEST PAIN. 25 tablet 3   pantoprazole (PROTONIX) 40 MG tablet Take 1 tablet (40 mg total) by mouth daily. 90 tablet 3   tadalafil (CIALIS) 5 MG tablet Take 1 tablet (5 mg total) by mouth daily. 90 tablet 3   dapagliflozin propanediol (FARXIGA) 5 MG TABS tablet Take 1 tablet (5 mg total) by mouth daily before breakfast. 90 tablet 3    No facility-administered medications prior to visit.    ROS: Review of Systems  Constitutional:  Negative for appetite change, fatigue and unexpected weight change.  HENT:  Negative for congestion, nosebleeds, sneezing, sore throat and trouble swallowing.   Eyes:  Negative for itching and visual disturbance.  Respiratory:  Negative for cough.   Cardiovascular:  Negative for chest pain, palpitations and leg swelling.  Gastrointestinal:  Negative for abdominal distention, blood in stool, diarrhea and nausea.  Genitourinary:  Negative for frequency and hematuria.  Musculoskeletal:  Negative for back pain, gait problem, joint swelling and neck pain.  Skin:  Negative for rash.  Neurological:  Negative for dizziness, tremors, speech difficulty and weakness.  Psychiatric/Behavioral:  Negative for agitation, dysphoric mood and sleep disturbance. The patient is not nervous/anxious.     Objective:  BP 110/70   Pulse (!) 58   Temp 97.9 F (36.6 C)   Ht 5\' 6"  (1.676 m)   Wt 157 lb (71.2 kg)   SpO2 98%   BMI 25.34 kg/m   BP Readings from Last 3 Encounters:  09/27/23 110/70  05/30/23 110/70  02/28/23 110/60    Wt Readings from Last 3 Encounters:  09/27/23 157 lb (71.2 kg)  05/30/23 159 lb (72.1  kg)  02/28/23 160 lb 12.8 oz (72.9 kg)    Physical Exam Constitutional:      General: He is not in acute distress.    Appearance: Normal appearance. He is well-developed.     Comments: NAD  Eyes:     Conjunctiva/sclera: Conjunctivae normal.     Pupils: Pupils are equal, round, and reactive to light.  Neck:     Thyroid: No thyromegaly.     Vascular: No JVD.  Cardiovascular:     Rate and Rhythm: Normal rate and regular rhythm.     Heart sounds: Normal heart sounds. No murmur heard.    No friction rub. No gallop.  Pulmonary:     Effort: Pulmonary effort is normal. No respiratory distress.     Breath sounds: Normal breath sounds. No wheezing or rales.  Chest:     Chest wall: No  tenderness.  Abdominal:     General: Bowel sounds are normal. There is no distension.     Palpations: Abdomen is soft. There is no mass.     Tenderness: There is no abdominal tenderness. There is no guarding or rebound.  Musculoskeletal:        General: No tenderness or signs of injury. Normal range of motion.     Cervical back: Normal range of motion.  Lymphadenopathy:     Cervical: No cervical adenopathy.  Skin:    General: Skin is warm and dry.     Findings: No rash.  Neurological:     Mental Status: He is alert and oriented to person, place, and time.     Cranial Nerves: No cranial nerve deficit.     Motor: No abnormal muscle tone.     Coordination: Coordination normal.     Gait: Gait normal.     Deep Tendon Reflexes: Reflexes are normal and symmetric.  Psychiatric:        Behavior: Behavior normal.        Thought Content: Thought content normal.        Judgment: Judgment normal.     Lab Results  Component Value Date   WBC 6.9 08/04/2022   HGB 14.8 08/04/2022   HCT 44.2 08/04/2022   PLT 139.0 (L) 08/04/2022   GLUCOSE 123 (H) 05/30/2023   CHOL 117 08/04/2022   TRIG 66.0 08/04/2022   HDL 43.40 08/04/2022   LDLCALC 60 08/04/2022   ALT 20 05/30/2023   AST 26 05/30/2023   NA 138 05/30/2023   K 4.7 05/30/2023   CL 105 05/30/2023   CREATININE 1.34 05/30/2023   BUN 20 05/30/2023   CO2 27 05/30/2023   TSH 1.60 08/04/2022   PSA 0.54 08/04/2022   INR 1.1 RATIO (H) 05/16/2008   HGBA1C 6.4 05/30/2023    No results found.  Assessment & Plan:   Problem List Items Addressed This Visit     Dyslipidemia   Cont on Zetia, Lipitor, Niacin      Essential hypertension   Cont on Losartan, Toprol, Farxiga      CAD (coronary artery disease)   Cont on Lipitor, Plavix, Losartan, Toprol, Zetia Niacin      Generalized anxiety disorder   Chronic  On alprazolam low dose prn  Potential benefits of a long term benzodiazepines  use as well as potential risks  and  complications were explained to the patient and were aknowledged.      Decreased GFR   On Farxiga (urinary frequency was there before [place])      Relevant Orders  Comprehensive metabolic panel   Microalbumin / creatinine urine ratio   Mild cognitive disorder   Stable      Diabetes mellitus type 2 in nonobese (HCC) - Primary   On Farxiga (urinary frequency was there before [place])      Relevant Medications   dapagliflozin propanediol (FARXIGA) 5 MG TABS tablet   Other Relevant Orders   Comprehensive metabolic panel   Urinalysis   Microalbumin / creatinine urine ratio   Hemoglobin A1c      Meds ordered this encounter  Medications   dapagliflozin propanediol (FARXIGA) 5 MG TABS tablet    Sig: Take 1 tablet (5 mg total) by mouth daily before breakfast.    Dispense:  90 tablet    Refill:  3      Follow-up: Return in about 4 months (around 01/27/2024) for a follow-up visit.  Sonda Primes, MD

## 2023-09-27 NOTE — Assessment & Plan Note (Signed)
 Cont on Losartan, Toprol, Marcelline Deist

## 2023-09-27 NOTE — Assessment & Plan Note (Signed)
 Chronic  On alprazolam low dose prn  Potential benefits of a long term benzodiazepines  use as well as potential risks  and complications were explained to the patient and were aknowledged.

## 2023-09-27 NOTE — Assessment & Plan Note (Signed)
 On Farxiga (urinary frequency was there before [place])

## 2023-09-30 ENCOUNTER — Encounter: Payer: Self-pay | Admitting: Internal Medicine

## 2023-10-07 ENCOUNTER — Encounter: Payer: Self-pay | Admitting: Internal Medicine

## 2023-10-17 ENCOUNTER — Other Ambulatory Visit: Payer: Self-pay | Admitting: Family

## 2023-10-17 MED ORDER — ALPRAZOLAM 0.5 MG PO TABS: 0.5000 mg | ORAL_TABLET | Freq: Every evening | ORAL | 3 refills | Status: DC | PRN

## 2023-12-27 ENCOUNTER — Other Ambulatory Visit: Payer: Self-pay | Admitting: Cardiovascular Disease

## 2024-01-14 ENCOUNTER — Other Ambulatory Visit: Payer: Self-pay | Admitting: Cardiovascular Disease

## 2024-01-17 DIAGNOSIS — L570 Actinic keratosis: Secondary | ICD-10-CM | POA: Diagnosis not present

## 2024-01-17 DIAGNOSIS — L720 Epidermal cyst: Secondary | ICD-10-CM | POA: Diagnosis not present

## 2024-01-17 DIAGNOSIS — D485 Neoplasm of uncertain behavior of skin: Secondary | ICD-10-CM | POA: Diagnosis not present

## 2024-01-24 ENCOUNTER — Ambulatory Visit: Admitting: Internal Medicine

## 2024-01-24 ENCOUNTER — Encounter: Payer: Self-pay | Admitting: Internal Medicine

## 2024-01-24 VITALS — BP 127/60 | HR 56 | Temp 97.9°F | Ht 66.0 in | Wt 153.0 lb

## 2024-01-24 DIAGNOSIS — Z7952 Long term (current) use of systemic steroids: Secondary | ICD-10-CM | POA: Insufficient documentation

## 2024-01-24 DIAGNOSIS — K2270 Barrett's esophagus without dysplasia: Secondary | ICD-10-CM

## 2024-01-24 DIAGNOSIS — F09 Unspecified mental disorder due to known physiological condition: Secondary | ICD-10-CM | POA: Diagnosis not present

## 2024-01-24 DIAGNOSIS — M150 Primary generalized (osteo)arthritis: Secondary | ICD-10-CM | POA: Insufficient documentation

## 2024-01-24 DIAGNOSIS — E119 Type 2 diabetes mellitus without complications: Secondary | ICD-10-CM | POA: Diagnosis not present

## 2024-01-24 DIAGNOSIS — Z7984 Long term (current) use of oral hypoglycemic drugs: Secondary | ICD-10-CM | POA: Diagnosis not present

## 2024-01-24 DIAGNOSIS — R944 Abnormal results of kidney function studies: Secondary | ICD-10-CM | POA: Diagnosis not present

## 2024-01-24 LAB — COMPREHENSIVE METABOLIC PANEL WITH GFR
ALT: 21 U/L (ref 0–53)
AST: 26 U/L (ref 0–37)
Albumin: 4.5 g/dL (ref 3.5–5.2)
Alkaline Phosphatase: 74 U/L (ref 39–117)
BUN: 13 mg/dL (ref 6–23)
CO2: 28 meq/L (ref 19–32)
Calcium: 9.4 mg/dL (ref 8.4–10.5)
Chloride: 103 meq/L (ref 96–112)
Creatinine, Ser: 1.11 mg/dL (ref 0.40–1.50)
GFR: 63.27 mL/min (ref >60.00)
Glucose, Bld: 111 mg/dL — ABNORMAL HIGH (ref 70–99)
Potassium: 4 meq/L (ref 3.5–5.1)
Sodium: 138 meq/L (ref 135–145)
Total Bilirubin: 0.7 mg/dL (ref 0.2–1.2)
Total Protein: 6.7 g/dL (ref 6.0–8.3)

## 2024-01-24 LAB — HEMOGLOBIN A1C: Hgb A1c MFr Bld: 6.5 % (ref 4.6–6.5)

## 2024-01-24 LAB — CBC WITH DIFFERENTIAL/PLATELET
Basophils Absolute: 0 K/uL (ref 0.0–0.1)
Basophils Relative: 0.8 % (ref 0.0–3.0)
Eosinophils Absolute: 0.2 K/uL (ref 0.0–0.7)
Eosinophils Relative: 3.3 % (ref 0.0–5.0)
HCT: 43.2 % (ref 39.0–52.0)
Hemoglobin: 14.2 g/dL (ref 13.0–17.0)
Lymphocytes Relative: 34.9 % (ref 12.0–46.0)
Lymphs Abs: 2 K/uL (ref 0.7–4.0)
MCHC: 32.9 g/dL (ref 30.0–36.0)
MCV: 88.8 fl (ref 78.0–100.0)
Monocytes Absolute: 0.5 K/uL (ref 0.1–1.0)
Monocytes Relative: 8.3 % (ref 3.0–12.0)
Neutro Abs: 3.1 K/uL (ref 1.4–7.7)
Neutrophils Relative %: 52.7 % (ref 43.0–77.0)
Platelets: 114 K/uL — ABNORMAL LOW (ref 150.0–400.0)
RBC: 4.86 Mil/uL (ref 4.22–5.81)
RDW: 14.3 % (ref 11.5–15.5)
WBC: 5.8 K/uL (ref 4.0–10.5)

## 2024-01-24 LAB — PSA: PSA: 0.7 ng/mL (ref 0.10–4.00)

## 2024-01-24 LAB — TSH: TSH: 1.69 u[IU]/mL (ref 0.35–5.50)

## 2024-01-24 NOTE — Assessment & Plan Note (Signed)
 On Farxiga (urinary frequency was there before [place])

## 2024-01-24 NOTE — Assessment & Plan Note (Signed)
 On Farxiga  (urinary frequency was there before Farxiga ) Cont to drink more

## 2024-01-24 NOTE — Assessment & Plan Note (Addendum)
 F/u w/ Dr Abran Last EGD 2019 - Dr Luis, 2024 - Dr Abran On Protonix 

## 2024-01-24 NOTE — Progress Notes (Signed)
 Subjective:  Patient ID: Joshua Brown, male    DOB: 04/29/1946  Age: 78 y.o. MRN: [id number]  CC: Medical Management of Chronic Issues (4 mnth f/u)   HPI THOMAS MABRY presents for CAD, dyslipidemia, GERD  Outpatient Medications Prior to Visit  Medication Sig Dispense Refill   ALPRAZolam  (XANAX ) 0.5 MG tablet Take 1 tablet (0.5 mg total) by mouth at bedtime as needed for anxiety. 30 tablet 3   atorvastatin  (LIPITOR) 80 MG tablet Take 1 tablet (80 mg total) by mouth daily. 90 tablet 0   b complex vitamins capsule Take 1 capsule by mouth daily.       Cetirizine HCl (KLS ALLER-TEC PO) Take 1 tablet by mouth daily as needed (allergies).     Cholecalciferol (VITAMIN D) 2000 UNIT CAPS Take 2,000 Units by mouth daily.      clobetasol  cream (TEMOVATE ) 0.05 % Apply 1 application. topically 2 (two) times daily. 30 g 2   clopidogrel  (PLAVIX ) 75 MG tablet Take 1 tablet (75 mg total) by mouth daily. 90 tablet 3   dapagliflozin  propanediol (FARXIGA ) 5 MG TABS tablet Take 1 tablet (5 mg total) by mouth daily before breakfast. 90 tablet 3   ezetimibe  (ZETIA ) 10 MG tablet TAKE 1 TABLET BY MOUTH DAILY. PLEASE KEEP SCHEDULED APPOINTMENT FOR FUTURE REFILLS. THANK YOU. 30 tablet 10   fluticasone  (FLONASE ) 50 MCG/ACT nasal spray Place 2 sprays into both nostrils daily. 16 g 6   GEMTESA 75 MG TABS Take 1 tablet by mouth daily.     Ginkgo Biloba 120 MG CAPS      losartan  (COZAAR ) 25 MG tablet TAKE 1 TABLET BY MOUTH EVERY DAY 30 tablet 10   metoprolol  succinate (TOPROL -XL) 50 MG 24 hr tablet TAKE 1 TABLET BY MOUTH EVERY DAY WITH OR IMMEDIATELY AFTER A MEAL. 30 tablet 10   naproxen (NAPROSYN) 500 MG tablet      niacin (NIASPAN) 500 MG CR tablet Take 2,000 mg by mouth at bedtime.     nitroGLYCERIN  (NITROSTAT ) 0.4 MG SL tablet PLACE 1 TABLET UNDER THE TONGUE EVERY 5 MINUTES AS NEEDED FOR CHEST PAIN. 25 tablet 3   pantoprazole  (PROTONIX ) 40 MG tablet TAKE 1 TABLET EVERY DAY 90 tablet 0   tadalafil  (CIALIS ) 5 MG  tablet Take 1 tablet (5 mg total) by mouth daily. 90 tablet 3   No facility-administered medications prior to visit.    ROS: Review of Systems  Constitutional:  Negative for appetite change, fatigue and unexpected weight change.  HENT:  Negative for congestion, nosebleeds, sneezing, sore throat and trouble swallowing.   Eyes:  Negative for itching and visual disturbance.  Respiratory:  Negative for cough.   Cardiovascular:  Negative for chest pain, palpitations and leg swelling.  Gastrointestinal:  Negative for abdominal distention, blood in stool, diarrhea and nausea.  Genitourinary:  Negative for frequency and hematuria.  Musculoskeletal:  Negative for back pain, gait problem, joint swelling and neck pain.  Skin:  Negative for rash.  Neurological:  Negative for dizziness, tremors, speech difficulty and weakness.  Psychiatric/Behavioral:  Negative for agitation, dysphoric mood and sleep disturbance. The patient is not nervous/anxious.     Objective:  BP 127/60   Pulse (!) 56   Temp 97.9 F (36.6 C) (Oral)   Ht 5' 6 (1.676 m)   Wt 153 lb (69.4 kg)   SpO2 99%   BMI 24.69 kg/m   BP Readings from Last 3 Encounters:  01/24/24 127/60  09/27/23 110/70  05/30/23 110/70  Wt Readings from Last 3 Encounters:  01/24/24 153 lb (69.4 kg)  09/27/23 157 lb (71.2 kg)  05/30/23 159 lb (72.1 kg)    Physical Exam Constitutional:      General: He is not in acute distress.    Appearance: Normal appearance. He is well-developed.     Comments: NAD  Eyes:     Conjunctiva/sclera: Conjunctivae normal.     Pupils: Pupils are equal, round, and reactive to light.  Neck:     Thyroid : No thyromegaly.     Vascular: No JVD.  Cardiovascular:     Rate and Rhythm: Normal rate and regular rhythm.     Heart sounds: Murmur heard.     No friction rub. No gallop.  Pulmonary:     Effort: Pulmonary effort is normal. No respiratory distress.     Breath sounds: Normal breath sounds. No wheezing or  rales.  Chest:     Chest wall: No tenderness.  Abdominal:     General: Bowel sounds are normal. There is no distension.     Palpations: Abdomen is soft. There is no mass.     Tenderness: There is no abdominal tenderness. There is no guarding or rebound.  Genitourinary:    Prostate: Normal.     Rectum: Normal. Guaiac result negative.  Musculoskeletal:        General: No tenderness. Normal range of motion.     Cervical back: Normal range of motion.  Lymphadenopathy:     Cervical: No cervical adenopathy.  Skin:    General: Skin is warm and dry.     Findings: No rash.  Neurological:     Mental Status: He is alert and oriented to person, place, and time.     Cranial Nerves: No cranial nerve deficit.     Motor: No abnormal muscle tone.     Coordination: Coordination normal.     Gait: Gait normal.     Deep Tendon Reflexes: Reflexes are normal and symmetric.  Psychiatric:        Behavior: Behavior normal.        Thought Content: Thought content normal.        Judgment: Judgment normal.   No bruit B  Lab Results  Component Value Date   WBC 6.9 08/04/2022   HGB 14.8 08/04/2022   HCT 44.2 08/04/2022   PLT 139.0 (L) 08/04/2022   GLUCOSE 117 (H) 09/27/2023   CHOL 117 08/04/2022   TRIG 66.0 08/04/2022   HDL 43.40 08/04/2022   LDLCALC 60 08/04/2022   ALT 24 09/27/2023   AST 26 09/27/2023   NA 138 09/27/2023   K 4.6 09/27/2023   CL 101 09/27/2023   CREATININE 1.15 09/27/2023   BUN 17 09/27/2023   CO2 30 09/27/2023   TSH 1.60 08/04/2022   PSA 0.54 08/04/2022   INR 1.1 RATIO (H) 05/16/2008   HGBA1C 6.3 09/27/2023   MICROALBUR -0.1 (L) 09/27/2023    No results found.  Assessment & Plan:   Problem List Items Addressed This Visit     Barrett's esophagus determined by biopsy   F/u w/ Dr Abran Last EGD 2019 - Dr Luis, 2024 - Dr Abran On Protonix       Decreased GFR   On Farxiga  (urinary frequency was there before Farxiga ) Cont to drink more      Relevant Orders    TSH   PSA   CBC with Differential/Platelet   Comprehensive metabolic panel with GFR   Hemoglobin A1c   Diabetes  mellitus type 2 in nonobese (HCC) - Primary   On Farxiga  (urinary frequency was there before Farxiga )      Relevant Orders   TSH   PSA   CBC with Differential/Platelet   Comprehensive metabolic panel with GFR   Hemoglobin A1c   Mild cognitive disorder    RTC 4-6 mo  No orders of the defined types were placed in this encounter.     Follow-up: Return in about 6 months (around 07/26/2024) for a follow-up visit.  Marolyn Noel, MD

## 2024-01-25 ENCOUNTER — Ambulatory Visit: Payer: Self-pay | Admitting: Internal Medicine

## 2024-02-12 ENCOUNTER — Encounter: Payer: Self-pay | Admitting: Cardiovascular Disease

## 2024-02-16 ENCOUNTER — Ambulatory Visit (HOSPITAL_COMMUNITY)
Admission: RE | Admit: 2024-02-16 | Discharge: 2024-02-16 | Disposition: A | Discharge status: Home / Self Care | Source: Ambulatory Visit | Attending: Cardiology | Admitting: Cardiology

## 2024-02-16 DIAGNOSIS — I2510 Atherosclerotic heart disease of native coronary artery without angina pectoris: Secondary | ICD-10-CM | POA: Diagnosis not present

## 2024-02-16 DIAGNOSIS — I10 Essential (primary) hypertension: Secondary | ICD-10-CM

## 2024-02-16 LAB — ECHOCARDIOGRAM COMPLETE
AR max vel: 1.5 cm2
AV Area VTI: 1.55 cm2
AV Area mean vel: 1.45 cm2
AV Mean grad: 10 mmHg
AV Peak grad: 19.1 mmHg
Ao pk vel: 2.19 m/s
Area-P 1/2: 2.78 cm2
S' Lateral: 2.3 cm

## 2024-02-17 ENCOUNTER — Ambulatory Visit (INDEPENDENT_AMBULATORY_CARE_PROVIDER_SITE_OTHER)

## 2024-02-17 VITALS — Ht 66.0 in | Wt 166.0 lb

## 2024-02-17 DIAGNOSIS — Z0000 Encounter for general adult medical examination without abnormal findings: Secondary | ICD-10-CM

## 2024-02-17 NOTE — Progress Notes (Signed)
 Subjective:  Please attest and cosign this visit due to patients primary care provider not being in the office at the time the visit was completed.  (Pt of Dr A. Plotnikov)   Joshua Brown is a 78 y.o. who presents for a Medicare Wellness preventive visit.  As a reminder, Annual Wellness Visits don't include a physical exam, and some assessments may be limited, especially if this visit is performed virtually. We may recommend an in-person follow-up visit with your provider if needed.  Visit Complete: Virtual I connected with  Donna JAYSON Pereyra on 02/17/24 by a audio enabled telemedicine application and verified that I am speaking with the correct person using two identifiers.  Patient Location: Home  Provider Location: Office/Clinic  I discussed the limitations of evaluation and management by telemedicine. The patient expressed understanding and agreed to proceed.  Vital Signs: Because this visit was a virtual/telehealth visit, some criteria may be missing or patient reported. Any vitals not documented were not able to be obtained and vitals that have been documented are patient reported.  VideoDeclined- This patient declined Librarian, academic. Therefore the visit was completed with audio only.  Persons Participating in Visit: Patient.  AWV Questionnaire: No: Patient Medicare AWV questionnaire was not completed prior to this visit.  Cardiac Risk Factors include: advanced age (>8men, >70 women);diabetes mellitus;dyslipidemia;hypertension;male gender     Objective:    Today's Vitals   02/17/24 1138  Weight: 166 lb (75.3 kg)  Height: 5' 6 (1.676 m)   Body mass index is 26.79 kg/m.     02/17/2024   11:51 AM 01/28/2023    8:28 AM 07/30/2021    9:17 AM 07/08/2020   10:14 AM 06/22/2019    9:13 AM  Advanced Directives  Does Patient Have a Medical Advance Directive? Yes Yes Yes Yes Yes  Type of Estate agent of [place];Living will  Healthcare Power of [place];Living will Living will;Healthcare Power of Attorney Living will;Healthcare Power of [employer] Power of [place];Living will  Does patient want to make changes to medical advance directive?   No - Patient declined No - Patient declined   Copy of Healthcare Power of Attorney in Chart? No - copy requested No - copy requested No - copy requested No - copy requested No - copy requested    Current Medications (verified) Outpatient Encounter Medications as of 02/17/2024  Medication Sig   ALPRAZolam  (XANAX ) 0.5 MG tablet Take 1 tablet (0.5 mg total) by mouth at bedtime as needed for anxiety.   atorvastatin  (LIPITOR) 80 MG tablet Take 1 tablet (80 mg total) by mouth daily.   b complex vitamins capsule Take 1 capsule by mouth daily.     Cetirizine HCl (KLS ALLER-TEC PO) Take 1 tablet by mouth daily as needed (allergies).   Cholecalciferol (VITAMIN D) 2000 UNIT CAPS Take 2,000 Units by mouth daily.    clobetasol  cream (TEMOVATE ) 0.05 % Apply 1 application. topically 2 (two) times daily.   clopidogrel  (PLAVIX ) 75 MG tablet Take 1 tablet (75 mg total) by mouth daily.   dapagliflozin  propanediol (FARXIGA ) 5 MG TABS tablet Take 1 tablet (5 mg total) by mouth daily before breakfast.   ezetimibe  (ZETIA ) 10 MG tablet TAKE 1 TABLET BY MOUTH DAILY. PLEASE KEEP SCHEDULED APPOINTMENT FOR FUTURE REFILLS. THANK YOU.   fluticasone  (FLONASE ) 50 MCG/ACT nasal spray Place 2 sprays into both nostrils daily.   GEMTESA 75 MG TABS Take 1 tablet by mouth daily.   Ginkgo Biloba 120  MG CAPS    losartan  (COZAAR ) 25 MG tablet TAKE 1 TABLET BY MOUTH EVERY DAY   metoprolol  succinate (TOPROL -XL) 50 MG 24 hr tablet TAKE 1 TABLET BY MOUTH EVERY DAY WITH OR IMMEDIATELY AFTER A MEAL.   naproxen (NAPROSYN) 500 MG tablet    niacin (NIASPAN) 500 MG CR tablet Take 2,000 mg by mouth at bedtime.   nitroGLYCERIN  (NITROSTAT ) 0.4 MG SL tablet PLACE 1 TABLET UNDER THE TONGUE EVERY 5 MINUTES AS NEEDED FOR  CHEST PAIN.   pantoprazole  (PROTONIX ) 40 MG tablet TAKE 1 TABLET EVERY DAY   tadalafil  (CIALIS ) 5 MG tablet Take 1 tablet (5 mg total) by mouth daily.   No facility-administered encounter medications on file as of 02/17/2024.    Allergies (verified) Patient has no known allergies.   History: Past Medical History:  Diagnosis Date   ABDOMINAL AORTIC ANEURYSM 11/12/2008   has annual U/s   Atypical mole 03/08/2018   mild atypia on R lower abdomen - no treatment   CAD (coronary artery disease)    premature   DDD (degenerative disc disease)    ED (erectile dysfunction)    Frequent urination at night    nocturia 2-3   GERD (gastroesophageal reflux disease)    Hyperglycemia    Borderline   Hyperlipidemia    Atherogenic   Hypertension    Myocardial infarct, old    Obesity    Class I   PVD (peripheral vascular disease) (HCC)    ABI June '12 -0.9 bilateral.   Past Surgical History:  Procedure Laterality Date   CATARACT EXTRACTION Left 01/10/2023   colonoscopy     one month ago   COLONOSCOPY     CORONARY ARTERY BYPASS GRAFT     TONSILLECTOMY     age 61   UPPER GASTROINTESTINAL ENDOSCOPY     Family History  Problem Relation Age of Onset   Cancer Mother        Jaw Cancer-hx of smoker   Hyperlipidemia Mother    Congestive Heart Failure Mother    Early death Father    Heart disease Father    CAD Father    Heart attack Father    Prostate cancer Brother    Heart disease Paternal Grandfather    CAD Paternal Grandfather    Heart attack Paternal Grandfather    Esophageal cancer Neg Hx    Stomach cancer Neg Hx    Colon cancer Neg Hx    Colon polyps Neg Hx    Rectal cancer Neg Hx    Social History   Socioeconomic History   Marital status: Married    Spouse name: Levorn   Number of children: 3   Years of education: 17   Highest education level: Bachelor's degree (e.g., BA, AB, BS)  Occupational History   Occupation: REAL Environmental manager: SELF EMPLOYED    Comment:  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   Occupation: retired  Tobacco Use   Smoking status: Former    Current packs/day: 0.00    Average packs/day: 1 pack/day for 16.0 years (16.0 ttl pk-yrs)    Types: Cigarettes    Start date: 07/05/1953    Quit date: 07/05/1969    Years since quitting: 54.6   Smokeless tobacco: Never  Vaping Use   Vaping status: Never Used  Substance and Sexual Activity   Alcohol use: Yes    Alcohol/week: 2.0 standard drinks of alcohol    Types: 2 Standard drinks or equivalent per week  Comment: socially   Drug use: No   Sexual activity: Yes    Partners: Female  Other Topics Concern   Not on file  Social History Narrative   HSG, [employer] 1 year. Married 1977. 1 son - 19; 2 daughters - 58, 58; 3  Grandchildren. Work - self - employed Fish farm manager. Marriage in good health. ACP - discussed. Provided packet May '13.   Social Drivers of Corporate investment banker Strain: Low Risk  (02/17/2024)   Overall Financial Resource Strain (CARDIA)    Difficulty of Paying Living Expenses: Not hard at all  Food Insecurity: No Food Insecurity (02/17/2024)   Hunger Vital Sign    Worried About Running Out of Food in the Last Year: Never true    Ran Out of Food in the Last Year: Never true  Transportation Needs: No Transportation Needs (02/17/2024)   PRAPARE - Administrator, Civil Service (Medical): No    Lack of Transportation (Non-Medical): No  Physical Activity: Sufficiently Active (02/17/2024)   Exercise Vital Sign    Days of Exercise per Week: 3 days    Minutes of Exercise per Session: 60 min  Stress: No Stress Concern Present (02/17/2024)   [employer] of Occupational Health - Occupational Stress Questionnaire    Feeling of Stress: Not at all  Social Connections: Socially Integrated (02/17/2024)   Social Connection and Isolation Panel    Frequency of Communication with Friends and Family: More than three times a week    Frequency of Social Gatherings  with Friends and Family: Once a week    Attends Religious Services: 1 to 4 times per year    Active Member of [employer] or Organizations: Yes    Attends Banker Meetings: 1 to 4 times per year    Marital Status: Married    Tobacco Counseling Counseling given: No    Clinical Intake:  Pre-visit preparation completed: Yes  Pain : No/denies pain     BMI - recorded: 26.79 Nutritional Status: BMI 25 -29 Overweight Nutritional Risks: None Diabetes: Yes CBG done?: No Did pt. bring in CBG monitor from home?: No  Lab Results  Component Value Date   HGBA1C 6.5 01/24/2024   HGBA1C 6.3 09/27/2023   HGBA1C 6.4 05/30/2023     How often do you need to have someone help you when you read instructions, pamphlets, or other written materials from your doctor or pharmacy?: 1 - Never  Interpreter Needed?: No  Information entered by :: Verdie Saba, CMA   Activities of Daily Living     02/17/2024   11:42 AM  In your present state of health, do you have any difficulty performing the following activities:  Hearing? 0  Comment wears hearing aids  Vision? 0  Difficulty concentrating or making decisions? 0  Walking or climbing stairs? 0  Dressing or bathing? 0  Doing errands, shopping? 0  Preparing Food and eating ? N  Using the Toilet? N  In the past six months, have you accidently leaked urine? Y  Do you have problems with loss of bowel control? N  Managing your Medications? N  Managing your Finances? N  Housekeeping or managing your Housekeeping? N    Patient Care Team: Plotnikov, Karlynn GAILS, MD as PCP - General (Internal Medicine) Wonda Sharper, MD as PCP - Cardiology (Cardiology) Luis Purchase, MD as Consulting Physician (Gastroenterology) Robinson Idol, MD as Consulting Physician (Ophthalmology) Chales Idol, MD as Consulting Physician (Urology) Jude Harden GAILS, MD  as Consulting Physician (Pulmonary Disease) Livingston Rigg, MD as Consulting Physician  (Dermatology) Mai Lynwood FALCON, MD as Consulting Physician (Rheumatology) Renda Glance, MD as Consulting Physician (Urology) Leslee Reusing, MD as Consulting Physician (Ophthalmology)  I have updated your Care Teams any recent Medical Services you may have received from other providers in the past year.     Assessment:   This is a routine wellness examination for [place].  Hearing/Vision screen Hearing Screening - Comments:: Wears hearing aids   Goals Addressed               This Visit's Progress     Patient Stated (pt-stated)        Patient stated he plans to continue being active - exercise       Depression Screen     02/17/2024   11:44 AM 01/24/2024    8:18 AM 05/30/2023    8:04 AM 01/28/2023    8:32 AM 11/02/2022    9:26 AM 08/04/2022    9:08 AM 07/30/2021    9:16 AM  PHQ 2/9 Scores  PHQ - 2 Score 0 0 0 0 0 0 0  PHQ- 9 Score 0   0       Fall Risk     02/17/2024   11:42 AM 01/24/2024    8:17 AM 09/27/2023    8:14 AM 05/30/2023    8:04 AM 01/28/2023    8:29 AM  Fall Risk   Falls in the past year? 0 0 0 0 0  Number falls in past yr: 0 0 0 0 0  Injury with Fall? 0 0 0 0 0  Risk for fall due to : No Fall Risks No Fall Risks No Fall Risks No Fall Risks No Fall Risks  Follow up Falls evaluation completed;Falls prevention discussed Falls evaluation completed Falls evaluation completed Falls evaluation completed Falls prevention discussed    MEDICARE RISK AT HOME:  Medicare Risk at Home Any stairs in or around the home?: No If so, are there any without handrails?: No Home free of loose throw rugs in walkways, pet beds, electrical cords, etc?: Yes Adequate lighting in your home to reduce risk of falls?: Yes Life alert?: No Use of a cane, walker or w/c?: No Grab bars in the bathroom?: Yes Shower chair or bench in shower?: No Elevated toilet seat or a handicapped toilet?: No  TIMED UP AND GO:  Was the test performed?  No  Cognitive Function: 6CIT  completed        02/17/2024   11:46 AM 01/28/2023    8:29 AM  6CIT Screen  What Year? 0 points 0 points  What month? 0 points 0 points  What time? 0 points 0 points  Count back from 20 0 points 0 points  Months in reverse 0 points 0 points  Repeat phrase 2 points 0 points  Total Score 2 points 0 points    Immunizations Immunization History  Administered Date(s) Administered   Fluad Quad(high Dose 65+) 04/09/2019   Fluad Trivalent(High Dose 65+) 04/18/2023   Hepatitis A 05/05/2006, 11/23/2006   Influenza, High Dose Seasonal PF 04/18/2016, 04/25/2017, 04/10/2018, 05/13/2020, 04/23/2021   Influenza-Unspecified 04/19/2022   Moderna Sars-Covid-2 Vaccination 10/11/2020   PFIZER(Purple Top)SARS-COV-2 Vaccination 07/24/2019, 08/14/2019, 02/28/2020   Pfizer Covid-19 Vaccine Bivalent Booster 30yrs & up 03/25/2021   Pneumococcal Conjugate-13 11/22/2013   Pneumococcal Polysaccharide-23 11/12/2011, 08/27/2020   Td 05/05/2006, 12/17/2013   Tdap 05/05/2006, 10/04/2019   Zoster Recombinant(Shingrix ) 12/14/2021, 03/22/2022   Zoster, Live  01/02/2014    Screening Tests Health Maintenance  Topic Date Due   FOOT EXAM  Never done   OPHTHALMOLOGY EXAM  02/01/2019   Colonoscopy  02/22/2023   COVID-19 Vaccine (6 - 2024-25 season) 03/06/2023   INFLUENZA VACCINE  02/03/2024   HEMOGLOBIN A1C  07/26/2024   Diabetic kidney evaluation - Urine ACR  09/26/2024   Diabetic kidney evaluation - eGFR measurement  01/23/2025   Medicare Annual Wellness (AWV)  02/16/2025   DTaP/Tdap/Td (5 - Td or Tdap) 10/03/2029   Pneumococcal Vaccine: 50+ Years  Completed   Hepatitis C Screening  Completed   Zoster Vaccines- Shingrix   Completed   HPV VACCINES  Aged Out   Meningococcal B Vaccine  Aged Out   Pneumococcal Vaccine  Discontinued    Health Maintenance  Health Maintenance Due  Topic Date Due   FOOT EXAM  Never done   OPHTHALMOLOGY EXAM  02/01/2019   Colonoscopy  02/22/2023   COVID-19 Vaccine (6 -  2024-25 season) 03/06/2023   INFLUENZA VACCINE  02/03/2024   Health Maintenance Items Addressed:  02/17/2024  Additional Screening:  Vision Screening: Recommended annual ophthalmology exams for early detection of glaucoma and other disorders of the eye. Would you like a referral to an eye doctor? No    Dental Screening: Recommended annual dental exams for proper oral hygiene  Community Resource Referral / Chronic Care Management: CRR required this visit?  No   CCM required this visit?  No   Plan:    I have personally reviewed and noted the following in the patient's chart:   Medical and social history Use of alcohol, tobacco or illicit drugs  Current medications and supplements including opioid prescriptions. Patient is not currently taking opioid prescriptions. Functional ability and status Nutritional status Physical activity Advanced directives List of other physicians Hospitalizations, surgeries, and ER visits in previous 12 months Vitals Screenings to include cognitive, depression, and falls Referrals and appointments  In addition, I have reviewed and discussed with patient certain preventive protocols, quality metrics, and best practice recommendations. A written personalized care plan for preventive services as well as general preventive health recommendations were provided to patient.   Verdie CHRISTELLA Saba, CMA   02/17/2024   After Visit Summary: (MyChart) Due to this being a telephonic visit, the after visit summary with patients personalized plan was offered to patient via MyChart   Notes: Nothing significant to report at this time.

## 2024-02-17 NOTE — Patient Instructions (Addendum)
 [name] , Thank you for taking time out of your busy schedule to complete your Annual Wellness Visit with me. I enjoyed our conversation and look forward to speaking with you again next year. I, as well as your care team,  appreciate your ongoing commitment to your health goals. Please review the following plan we discussed and let me know if I can assist you in the future. Your Game plan/ To Do List    Referrals: If you haven't heard from the office you've been referred to, please reach out to them at the phone provided.   Follow up Visits: We will see or speak with you next year for your Next Medicare AWV with our clinical staff Have you seen your provider in the last 6 months (3 months if uncontrolled diabetes)? No  Clinician Recommendations:  Aim for 30 minutes of exercise or brisk walking, 6-8 glasses of water, and 5 servings of fruits and vegetables each day.       This is a list of the screenings recommended for you:  Health Maintenance  Topic Date Due   Complete foot exam   Never done   Eye exam for diabetics  02/01/2019   Colon Cancer Screening  02/22/2023   COVID-19 Vaccine (6 - 2024-25 season) 03/06/2023   Flu Shot  02/03/2024   Hemoglobin A1C  07/26/2024   Yearly kidney health urinalysis for diabetes  09/26/2024   Yearly kidney function blood test for diabetes  01/23/2025   Medicare Annual Wellness Visit  02/16/2025   DTaP/Tdap/Td vaccine (5 - Td or Tdap) 10/03/2029   Pneumococcal Vaccine for age over 44  Completed   Hepatitis C Screening  Completed   Zoster (Shingles) Vaccine  Completed   HPV Vaccine  Aged Out   Meningitis B Vaccine  Aged Out   Pneumococcal Vaccine  Discontinued    Advanced directives: (Copy Requested) Please bring a copy of your health care power of attorney and living will to the office to be added to your chart at your convenience. You can mail to [facility] [address] [place], [place] [address] or email to  [email or web address] Advance Care Planning is important because it:  [x]  Makes sure you receive the medical care that is consistent with your values, goals, and preferences  [x]  It provides guidance to your family and loved ones and reduces their decisional burden about whether or not they are making the right decisions based on your wishes.  Follow the link provided in your after visit summary or read over the paperwork we have mailed to you to help you started getting your Advance Directives in place. If you need assistance in completing these, please reach out to us  so that we can help you!

## 2024-02-21 ENCOUNTER — Ambulatory Visit: Payer: Self-pay | Admitting: Cardiovascular Disease

## 2024-02-28 ENCOUNTER — Ambulatory Visit: Attending: Cardiovascular Disease | Admitting: Cardiovascular Disease

## 2024-02-28 ENCOUNTER — Encounter: Payer: Self-pay | Admitting: Cardiovascular Disease

## 2024-02-28 VITALS — BP 112/48 | HR 66 | Ht 67.0 in | Wt 154.6 lb

## 2024-02-28 DIAGNOSIS — E782 Mixed hyperlipidemia: Secondary | ICD-10-CM | POA: Diagnosis not present

## 2024-02-28 DIAGNOSIS — I2510 Atherosclerotic heart disease of native coronary artery without angina pectoris: Secondary | ICD-10-CM

## 2024-02-28 DIAGNOSIS — I350 Nonrheumatic aortic (valve) stenosis: Secondary | ICD-10-CM | POA: Diagnosis not present

## 2024-02-28 DIAGNOSIS — I10 Essential (primary) hypertension: Secondary | ICD-10-CM | POA: Diagnosis not present

## 2024-02-28 DIAGNOSIS — I739 Peripheral vascular disease, unspecified: Secondary | ICD-10-CM

## 2024-02-28 NOTE — Patient Instructions (Signed)

## 2024-02-28 NOTE — Assessment & Plan Note (Signed)
 Blood pressure under optimal control.Patient remains asymptomatic.  Labs excellent with creatinine of 1.1 and potassium 4.0.

## 2024-02-28 NOTE — Progress Notes (Signed)
 Cardiology Office Note:    Date:  02/28/2024   ID:  Kinney, Sackmann 04-28-1946, MRN [id number]  PCP:  Garald Karlynn GAILS, MD   [facility] Providers Cardiologist:  Ozell Fell, MD     Referring MD: Garald Karlynn GAILS, MD   Chief Complaint  Patient presents with   Coronary Artery Disease    History of Present Illness:    Joshua Brown is a 78 y.o. male with a hx of coronary artery disease with remote MI at age 55.  The patient underwent multivessel CABG at age 30.  He is followed for peripheral arterial disease with intermittent claudication secondary to distal aortic and bilateral iliac stenoses. He has been maintained on long-term clopidogrel  in the setting of his extensive premature CAD. The patient's most recent stress Myoview  scan and February 2023 showed normal LVEF of 59% and no ischemia.  He has developed mild aortic stenosis and had a recent echocardiogram demonstrating moderate calcification of the aortic valve leaflets and mild aortic stenosis with a mean gradient of 10 mmHg.   Current Medications: Current Meds  Medication Sig   ALPRAZolam  (XANAX ) 0.5 MG tablet Take 1 tablet (0.5 mg total) by mouth at bedtime as needed for anxiety.   atorvastatin  (LIPITOR) 80 MG tablet Take 1 tablet (80 mg total) by mouth daily.   b complex vitamins capsule Take 1 capsule by mouth daily.     Cetirizine HCl (KLS ALLER-TEC PO) Take 1 tablet by mouth daily as needed (allergies).   Cholecalciferol (VITAMIN D) 2000 UNIT CAPS Take 2,000 Units by mouth daily.    clobetasol  cream (TEMOVATE ) 0.05 % Apply 1 application. topically 2 (two) times daily.   clopidogrel  (PLAVIX ) 75 MG tablet Take 1 tablet (75 mg total) by mouth daily.   dapagliflozin  propanediol (FARXIGA ) 5 MG TABS tablet Take 1 tablet (5 mg total) by mouth daily before breakfast.   ezetimibe  (ZETIA ) 10 MG tablet TAKE 1 TABLET BY MOUTH DAILY. PLEASE KEEP SCHEDULED APPOINTMENT FOR FUTURE REFILLS. THANK YOU.    fluticasone  (FLONASE ) 50 MCG/ACT nasal spray Place 2 sprays into both nostrils daily. (Patient taking differently: Place 2 sprays into both nostrils as needed.)   GEMTESA 75 MG TABS Take 1 tablet by mouth daily.   Ginkgo Biloba 120 MG CAPS    losartan  (COZAAR ) 25 MG tablet TAKE 1 TABLET BY MOUTH EVERY DAY   metoprolol  succinate (TOPROL -XL) 50 MG 24 hr tablet TAKE 1 TABLET BY MOUTH EVERY DAY WITH OR IMMEDIATELY AFTER A MEAL.   naproxen (NAPROSYN) 500 MG tablet    niacin (NIASPAN) 500 MG CR tablet Take 2,000 mg by mouth at bedtime.   nitroGLYCERIN  (NITROSTAT ) 0.4 MG SL tablet PLACE 1 TABLET UNDER THE TONGUE EVERY 5 MINUTES AS NEEDED FOR CHEST PAIN.   pantoprazole  (PROTONIX ) 40 MG tablet TAKE 1 TABLET EVERY DAY   tadalafil  (CIALIS ) 5 MG tablet Take 1 tablet (5 mg total) by mouth daily.     Allergies:   Patient has no known allergies.   ROS:   Please see the history of present illness.    All other systems reviewed and are negative.  EKGs/Labs/Other Studies Reviewed:    The following studies were reviewed today: Cardiac Studies & Procedures   ______________________________________________________________________________________________   STRESS TESTS  MYOCARDIAL PERFUSION IMAGING 08/19/2021  Interpretation Summary   The study is normal. The study is low risk.   No ST deviation was noted.   LV perfusion is normal. There is no evidence of  ischemia. There is no evidence of infarction.   Left ventricular function is normal. Nuclear stress EF: 59 %. The left ventricular ejection fraction is normal (55-65%). End diastolic cavity size is normal. End systolic cavity size is normal.   Prior study available for comparison from 01/17/2012.  Normal resting and stress perfusion. No ischemia or infarction EF 59%   ECHOCARDIOGRAM  ECHOCARDIOGRAM COMPLETE 02/16/2024  Narrative ECHOCARDIOGRAM REPORT    Patient Name:   Joshua Brown Date of Exam: 02/16/2024 Medical Rec #:  [id number]      Height:       66.0 in Accession #:    [id number]    Weight:       153.0 lb Date of Birth:  06/06/1946     BSA:          1.785 m Patient Age:    71 years      BP:           110/60 mmHg Patient Gender: M             HR:           67 bpm. Exam Location:  [address]  Procedure: 2D Echo, Cardiac Doppler, Color Doppler, 3D Echo and Strain Analysis (Both Spectral and Color Flow Doppler were utilized during procedure).  Indications:    I25.10 CAD  History:        Patient has prior history of Echocardiogram examinations, most recent 08/19/2021. CAD and Previous Myocardial Infarction, Prior CABG, Signs/Symptoms:Murmur; Risk Factors:Hypertension, Dyslipidemia, Former Smoker and Family History of Coronary Artery Disease.  Sonographer:    Elsie Bohr RDCS Referring Phys: [phone] Hattye Siegfried  IMPRESSIONS   1. Left ventricular ejection fraction, by estimation, is 60 to 65%. The left ventricle has normal function. The left ventricle has no regional wall motion abnormalities. There is mild concentric left ventricular hypertrophy with moderate focal hypertrophy of the basilar septum. Left ventricular diastolic parameters are consistent with Grade II diastolic dysfunction (pseudonormalization). The average left ventricular global longitudinal strain is -18.7 %. The global longitudinal strain is normal. 2. Right ventricular systolic function is normal. The right ventricular size is normal. 3. Left atrial size was moderately dilated. 4. The mitral valve is normal in structure. Mild mitral valve regurgitation. No evidence of mitral stenosis. 5. The aortic valve is tricuspid. There is moderate calcification of the aortic valve. Aortic valve regurgitation is not visualized. Mild aortic valve stenosis. Aortic valve area, by VTI measures 1.55 cm. Aortic valve mean gradient measures 10.0 mmHg. Aortic valve Vmax measures 2.18 m/s. 6. The inferior vena cava is normal in size with greater than 50%  respiratory variability, suggesting right atrial pressure of 3 mmHg.  FINDINGS Left Ventricle: Left ventricular ejection fraction, by estimation, is 60 to 65%. The left ventricle has normal function. The left ventricle has no regional wall motion abnormalities. The average left ventricular global longitudinal strain is -18.7 %. Strain was performed and the global longitudinal strain is normal. The left ventricular internal cavity size was normal in size. There is mild concentric left ventricular hypertrophy. Left ventricular diastolic parameters are consistent with Grade II diastolic dysfunction (pseudonormalization).  Right Ventricle: The right ventricular size is normal. No increase in right ventricular wall thickness. Right ventricular systolic function is normal.  Left Atrium: Left atrial size was moderately dilated.  Right Atrium: Right atrial size was normal in size.  Pericardium: There is no evidence of pericardial effusion.  Mitral Valve: The mitral valve is normal in structure. Mild  mitral annular calcification. Mild mitral valve regurgitation. No evidence of mitral valve stenosis.  Tricuspid Valve: The tricuspid valve is normal in structure. Tricuspid valve regurgitation is mild . No evidence of tricuspid stenosis.  Aortic Valve: The aortic valve is tricuspid. There is moderate calcification of the aortic valve. Aortic valve regurgitation is not visualized. Mild aortic stenosis is present. Aortic valve mean gradient measures 10.0 mmHg. Aortic valve peak gradient measures 19.1 mmHg. Aortic valve area, by VTI measures 1.55 cm.  Pulmonic Valve: The pulmonic valve was normal in structure. Pulmonic valve regurgitation is trivial. No evidence of pulmonic stenosis.  Aorta: The aortic root is normal in size and structure.  Venous: The inferior vena cava is normal in size with greater than 50% respiratory variability, suggesting right atrial pressure of 3 mmHg.  IAS/Shunts: No atrial  level shunt detected by color flow Doppler.  Additional Comments: 3D was performed not requiring image post processing on an independent workstation and was indeterminate.   LEFT VENTRICLE PLAX 2D LVIDd:         3.50 cm   Diastology LVIDs:         2.30 cm   LV e' medial:    6.42 cm/s LV PW:         1.30 cm   LV E/e' medial:  18.4 LV IVS:        1.60 cm   LV e' lateral:   11.70 cm/s LVOT diam:     2.00 cm   LV E/e' lateral: 10.1 LV SV:         75 LV SV Index:   42        2D Longitudinal Strain LVOT Area:     3.14 cm  2D Strain GLS (A4C):   -18.0 % 2D Strain GLS (A3C):   -21.2 % 2D Strain GLS (A2C):   -16.8 % 2D Strain GLS Avg:     -18.7 %  3D Volume EF: 3D EF:        55 % LV EDV:       116 ml LV ESV:       52 ml LV SV:        63 ml  RIGHT VENTRICLE             IVC RV S prime:     11.60 cm/s  IVC diam: 1.20 cm TAPSE (M-mode): 1.9 cm RVSP:           26.4 mmHg  LEFT ATRIUM             Index        RIGHT ATRIUM           Index LA diam:        4.00 cm 2.24 cm/m   RA Pressure: 3.00 mmHg LA Vol (A2C):   53.2 ml 29.81 ml/m  RA Area:     11.70 cm LA Vol (A4C):   67.4 ml 37.77 ml/m  RA Volume:   25.90 ml  14.51 ml/m LA Biplane Vol: 62.5 ml 35.02 ml/m AORTIC VALVE AV Area (Vmax):    1.50 cm AV Area (Vmean):   1.45 cm AV Area (VTI):     1.55 cm AV Vmax:           218.50 cm/s AV Vmean:          148.500 cm/s AV VTI:            0.487 m AV Peak Grad:      19.1  mmHg AV Mean Grad:      10.0 mmHg LVOT Vmax:         104.00 cm/s LVOT Vmean:        68.500 cm/s LVOT VTI:          0.240 m LVOT/AV VTI ratio: 0.49  AORTA Ao Root diam: 3.00 cm Ao Asc diam:  3.30 cm  MITRAL VALVE                TRICUSPID VALVE MV Area (PHT): 2.78 cm     TR Peak grad:   23.4 mmHg MV Decel Time: 273 msec     TR Vmax:        242.00 cm/s MV E velocity: 118.00 cm/s  Estimated RAP:  3.00 mmHg MV A velocity: 79.10 cm/s   RVSP:           26.4 mmHg MV E/A ratio:  1.49 SHUNTS Systemic VTI:  0.24  m Systemic Diam: 2.00 cm  Toribio Fuel MD Electronically signed by Toribio Fuel MD Signature Date/Time: 02/16/2024/8:58:23 PM    Final          ______________________________________________________________________________________________      EKG:   EKG Interpretation Date/Time:  Tuesday February 28 2024 10:17:32 EDT Ventricular Rate:  66 PR Interval:  166 QRS Duration:  94 QT Interval:  408 QTC Calculation: 427 R Axis:   23  Text Interpretation: Sinus rhythm with occasional Premature ventricular complexes T wave abnormality, consider inferior ischemia When compared with ECG of 28-Feb-2023 09:35, Premature ventricular complexes are now Present Borderline criteria for Inferior infarct are no longer Present T wave amplitude has increased in Anterior leads Confirmed by Wonda Sharper [phone]) on 02/28/2024 10:37:31 AM    Recent Labs: 01/24/2024: ALT 21; BUN 13; Creatinine, Ser 1.11; Hemoglobin 14.2; Platelets 114.0; Potassium 4.0; Sodium 138; TSH 1.69  Recent Lipid Panel    Component Value Date/Time   CHOL 117 08/04/2022 0955   CHOL 120 06/02/2020 0740   TRIG 66.0 08/04/2022 0955   HDL 43.40 08/04/2022 0955   HDL 41 06/02/2020 0740   CHOLHDL 3 08/04/2022 0955   VLDL 13.2 08/04/2022 0955   LDLCALC 60 08/04/2022 0955   LDLCALC 67 06/02/2020 0740     Risk Assessment/Calculations:                Physical Exam:    VS:  BP (!) 112/48   Pulse 66   Ht 5' 7 (1.702 m)   Wt 154 lb 9.6 oz (70.1 kg)   SpO2 97%   BMI 24.21 kg/m     Wt Readings from Last 3 Encounters:  02/28/24 154 lb 9.6 oz (70.1 kg)  02/17/24 166 lb (75.3 kg)  01/24/24 153 lb (69.4 kg)     GEN:  Well nourished, well developed in no acute distress HEENT: Normal NECK: No JVD; No carotid bruits LYMPHATICS: No lymphadenopathy CARDIAC: RRR, 2/6 systolic murmur at the right upper sternal border RESPIRATORY:  Clear to auscultation without rales, wheezing or rhonchi  ABDOMEN: Soft,  non-tender, non-distended MUSCULOSKELETAL:  No edema; No deformity  SKIN: Warm and dry NEUROLOGIC:  Alert and oriented x 3 PSYCHIATRIC:  Normal affect   Assessment & Plan Essential hypertension Blood pressure under optimal control.Patient remains asymptomatic.  Labs excellent with creatinine of 1.1 and potassium 4.0. Coronary artery disease involving native coronary artery of native heart without angina pectoris No angina on current medical regimen.  Continue atorvastatin , clopidogrel , and metoprolol  succinate. PAD (peripheral artery disease) (HCC) Patient without  claudication symptoms at present.  He is known to have aortoiliac plaquing and stenosis without any high-grade or critical lesions.  Continue medical management. Mixed hyperlipidemia Treated with ezetimibe  and atorvastatin .  LDL cholesterol is 60. Nonrheumatic aortic (valve) stenosis Patient with mild aortic stenosis on recent echo, moderate calcification of the aortic valve noted but mean gradient only 10 mmHg and calculated valve area 1.55 cm.  We discussed the natural history of aortic stenosis and potential treatment options in the future.  We reviewed cardinal signs and symptoms of aortic stenosis and we will plan to follow him with a repeat echocardiogram in about 24 months.  I will see him back for clinical follow-up next year.            Medication Adjustments/Labs and Tests Ordered: Current medicines are reviewed at length with the patient today.  Concerns regarding medicines are outlined above.  Orders Placed This Encounter  Procedures   EKG 12-Lead   No orders of the defined types were placed in this encounter.   Patient Instructions  Medication Instructions:  No medication changes were made at this visit. Continue current regimen.   *If you need a refill on your cardiac medications before your next appointment, please call your pharmacy*  Lab Work: None ordered today. If you have labs (blood work) drawn  today and your tests are completely normal, you will receive your results only by: MyChart Message (if you have MyChart) OR A paper copy in the mail If you have any lab test that is abnormal or we need to change your treatment, we will call you to review the results.  Testing/Procedures: None ordered today.  Follow-Up: At [facility], you and your health needs are our priority.  As part of our continuing mission to provide you with exceptional heart care, our providers are all part of one team.  This team includes your primary Cardiologist (physician) and Advanced Practice Providers or APPs (Physician Assistants and Nurse Practitioners) who all work together to provide you with the care you need, when you need it.  Your next appointment:   1 year(s)  Provider:   Ozell Fell, MD      Signed, Ozell Fell, MD  02/28/2024 1:03 PM    [facility]

## 2024-02-28 NOTE — Assessment & Plan Note (Signed)
 No angina on current medical regimen.  Continue atorvastatin , clopidogrel , and metoprolol  succinate.

## 2024-03-01 ENCOUNTER — Other Ambulatory Visit: Payer: Self-pay | Admitting: Cardiovascular Disease

## 2024-03-01 ENCOUNTER — Other Ambulatory Visit: Payer: Self-pay | Admitting: Internal Medicine

## 2024-03-06 ENCOUNTER — Other Ambulatory Visit: Payer: Self-pay

## 2024-03-06 DIAGNOSIS — H524 Presbyopia: Secondary | ICD-10-CM | POA: Diagnosis not present

## 2024-03-06 DIAGNOSIS — H35372 Puckering of macula, left eye: Secondary | ICD-10-CM | POA: Diagnosis not present

## 2024-03-06 DIAGNOSIS — Z961 Presence of intraocular lens: Secondary | ICD-10-CM | POA: Diagnosis not present

## 2024-04-11 ENCOUNTER — Other Ambulatory Visit: Payer: Self-pay | Admitting: Cardiovascular Disease

## 2024-06-05 ENCOUNTER — Other Ambulatory Visit: Payer: Self-pay | Admitting: Cardiovascular Disease

## 2024-06-18 ENCOUNTER — Encounter: Payer: Self-pay | Admitting: Pharmacist

## 2024-06-18 NOTE — Progress Notes (Signed)
 Pharmacy Quality Measure Review  This patient is appearing on a report for being at risk of failing the adherence measure for cholesterol (statin) medications this calendar year.   Medication: atorvastatin  Last fill date: 06/11/24 for 90 day supply  Insurance report was not up to date. No action needed at this time.   Darrelyn Drum, PharmD, BCPS, CPP Clinical Pharmacist Practitioner [facility] [phone]

## 2024-07-09 ENCOUNTER — Ambulatory Visit: Admitting: Internal Medicine

## 2024-07-09 ENCOUNTER — Encounter: Payer: Self-pay | Admitting: Internal Medicine

## 2024-07-09 VITALS — BP 116/84 | HR 57 | Temp 97.8°F | Ht 67.0 in | Wt 157.8 lb

## 2024-07-09 DIAGNOSIS — Z125 Encounter for screening for malignant neoplasm of prostate: Secondary | ICD-10-CM | POA: Diagnosis not present

## 2024-07-09 DIAGNOSIS — I2510 Atherosclerotic heart disease of native coronary artery without angina pectoris: Secondary | ICD-10-CM

## 2024-07-09 DIAGNOSIS — E119 Type 2 diabetes mellitus without complications: Secondary | ICD-10-CM

## 2024-07-09 DIAGNOSIS — I2583 Coronary atherosclerosis due to lipid rich plaque: Secondary | ICD-10-CM

## 2024-07-09 DIAGNOSIS — E785 Hyperlipidemia, unspecified: Secondary | ICD-10-CM | POA: Diagnosis not present

## 2024-07-09 DIAGNOSIS — N320 Bladder-neck obstruction: Secondary | ICD-10-CM

## 2024-07-09 DIAGNOSIS — Z0000 Encounter for general adult medical examination without abnormal findings: Secondary | ICD-10-CM

## 2024-07-09 DIAGNOSIS — I10 Essential (primary) hypertension: Secondary | ICD-10-CM

## 2024-07-09 DIAGNOSIS — Z7984 Long term (current) use of oral hypoglycemic drugs: Secondary | ICD-10-CM

## 2024-07-09 LAB — LIPID PANEL
Cholesterol: 111 mg/dL (ref 28–200)
HDL: 41.1 mg/dL
LDL Cholesterol: 61 mg/dL (ref 10–99)
NonHDL: 70.19
Total CHOL/HDL Ratio: 3
Triglycerides: 47 mg/dL (ref 10.0–149.0)
VLDL: 9.4 mg/dL (ref 0.0–40.0)

## 2024-07-09 LAB — CBC WITH DIFFERENTIAL/PLATELET
Basophils Absolute: 0 K/uL (ref 0.0–0.1)
Basophils Relative: 0.6 % (ref 0.0–3.0)
Eosinophils Absolute: 0.3 K/uL (ref 0.0–0.7)
Eosinophils Relative: 3.4 % (ref 0.0–5.0)
HCT: 45.3 % (ref 39.0–52.0)
Hemoglobin: 15 g/dL (ref 13.0–17.0)
Lymphocytes Relative: 29.5 % (ref 12.0–46.0)
Lymphs Abs: 2.2 K/uL (ref 0.7–4.0)
MCHC: 33.1 g/dL (ref 30.0–36.0)
MCV: 88.4 fl (ref 78.0–100.0)
Monocytes Absolute: 0.5 K/uL (ref 0.1–1.0)
Monocytes Relative: 7.4 % (ref 3.0–12.0)
Neutro Abs: 4.4 K/uL (ref 1.4–7.7)
Neutrophils Relative %: 59.1 % (ref 43.0–77.0)
Platelets: 119 K/uL — ABNORMAL LOW (ref 150.0–400.0)
RBC: 5.13 Mil/uL (ref 4.22–5.81)
RDW: 15.6 % — ABNORMAL HIGH (ref 11.5–15.5)
WBC: 7.4 K/uL (ref 4.0–10.5)

## 2024-07-09 LAB — COMPREHENSIVE METABOLIC PANEL WITH GFR
ALT: 19 U/L (ref 3–53)
AST: 24 U/L (ref 5–37)
Albumin: 4.4 g/dL (ref 3.5–5.2)
Alkaline Phosphatase: 80 U/L (ref 39–117)
BUN: 16 mg/dL (ref 6–23)
CO2: 31 meq/L (ref 19–32)
Calcium: 9.3 mg/dL (ref 8.4–10.5)
Chloride: 103 meq/L (ref 96–112)
Creatinine, Ser: 1.25 mg/dL (ref 0.40–1.50)
GFR: 54.68 mL/min — ABNORMAL LOW
Glucose, Bld: 114 mg/dL — ABNORMAL HIGH (ref 70–99)
Potassium: 4.8 meq/L (ref 3.5–5.1)
Sodium: 139 meq/L (ref 135–145)
Total Bilirubin: 0.6 mg/dL (ref 0.2–1.2)
Total Protein: 6.8 g/dL (ref 6.0–8.3)

## 2024-07-09 LAB — PSA: PSA: 0.45 ng/mL (ref 0.10–4.00)

## 2024-07-09 LAB — URINALYSIS
Bilirubin Urine: NEGATIVE
Hgb urine dipstick: NEGATIVE
Ketones, ur: NEGATIVE
Leukocytes,Ua: NEGATIVE
Nitrite: NEGATIVE
Specific Gravity, Urine: <=1.005 — AB (ref 1.000–1.030)
Total Protein, Urine: NEGATIVE
Urine Glucose: >=1000 — AB
Urobilinogen, UA: 0.2 (ref 0.0–1.0)
pH: 7 (ref 5.0–8.0)

## 2024-07-09 LAB — TSH: TSH: 1.96 u[IU]/mL (ref 0.35–5.50)

## 2024-07-09 LAB — MICROALBUMIN / CREATININE URINE RATIO
Creatinine,U: 18.8 mg/dL
Microalb Creat Ratio: UNDETERMINED mg/g (ref 0.0–30.0)
Microalb, Ur: <0.7 mg/dL

## 2024-07-09 LAB — HEMOGLOBIN A1C: Hgb A1c MFr Bld: 6.4 % (ref 4.6–6.5)

## 2024-07-09 MED ORDER — MIRABEGRON ER 50 MG PO TB24: 50.0000 mg | ORAL_TABLET | Freq: Every day | ORAL | 11 refills | Status: AC

## 2024-07-09 NOTE — Assessment & Plan Note (Signed)
 On Farxiga (urinary frequency was there before [place])

## 2024-07-09 NOTE — Assessment & Plan Note (Signed)
Cont on Lipitor, Plavix, Losartan, Toprol, Zetia Niacin 

## 2024-07-09 NOTE — Assessment & Plan Note (Signed)
 Cont on Losartan, Toprol, Marcelline Deist

## 2024-07-09 NOTE — Progress Notes (Signed)
 "  Subjective:  Patient ID: Joshua Brown, male    DOB: 14-Jan-1946  Age: 79 y.o. MRN: [id number]  CC: Annual Exam (Annual Exam)   HPI Joshua Brown presents for a well exam C/o frequency  Outpatient Medications Prior to Visit  Medication Sig Dispense Refill   ALPRAZolam  (XANAX ) 0.5 MG tablet Take 1 tablet (0.5 mg total) by mouth at bedtime as needed for anxiety. 30 tablet 3   atorvastatin  (LIPITOR) 80 MG tablet Take 1 tablet (80 mg total) by mouth daily. 90 tablet 2   b complex vitamins capsule Take 1 capsule by mouth daily.       Cetirizine HCl (KLS ALLER-TEC PO) Take 1 tablet by mouth daily as needed (allergies).     Cholecalciferol (VITAMIN D) 2000 UNIT CAPS Take 2,000 Units by mouth daily.      clobetasol  cream (TEMOVATE ) 0.05 % Apply 1 application. topically 2 (two) times daily. 30 g 2   clopidogrel  (PLAVIX ) 75 MG tablet TAKE 1 TABLET EVERY DAY 90 tablet 3   dapagliflozin  propanediol (FARXIGA ) 5 MG TABS tablet Take 1 tablet (5 mg total) by mouth daily before breakfast. 90 tablet 3   ezetimibe  (ZETIA ) 10 MG tablet TAKE 1 TABLET EVERY DAY 90 tablet 3   fluticasone  (FLONASE ) 50 MCG/ACT nasal spray Place 2 sprays into both nostrils daily. (Patient taking differently: Place 2 sprays into both nostrils as needed.) 16 g 6   GEMTESA 75 MG TABS Take 1 tablet by mouth daily.     Ginkgo Biloba 120 MG CAPS      losartan  (COZAAR ) 25 MG tablet Take 1 tablet (25 mg total) by mouth daily. 90 tablet 3   metoprolol  succinate (TOPROL -XL) 50 MG 24 hr tablet TAKE 1 TABLET BY MOUTH EVERY DAY WITH OR IMMEDIATELY AFTER A MEAL. 90 tablet 3   naproxen (NAPROSYN) 500 MG tablet      niacin (NIASPAN) 500 MG CR tablet Take 2,000 mg by mouth at bedtime.     nitroGLYCERIN  (NITROSTAT ) 0.4 MG SL tablet PLACE 1 TABLET UNDER THE TONGUE EVERY 5 MINUTES AS NEEDED FOR CHEST PAIN. 25 tablet 3   pantoprazole  (PROTONIX ) 40 MG tablet TAKE 1 TABLET EVERY DAY 90 tablet 3   tadalafil  (CIALIS ) 5 MG tablet TAKE 1 TABLET (5 MG  TOTAL) BY MOUTH DAILY. 90 tablet 3   No facility-administered medications prior to visit.    ROS: Review of Systems  Constitutional:  Negative for appetite change, fatigue and unexpected weight change.  HENT:  Negative for congestion, nosebleeds, sneezing, sore throat and trouble swallowing.   Eyes:  Negative for itching and visual disturbance.  Respiratory:  Negative for cough.   Cardiovascular:  Negative for chest pain, palpitations and leg swelling.  Gastrointestinal:  Negative for abdominal distention, blood in stool, diarrhea and nausea.  Genitourinary:  Positive for frequency and urgency. Negative for hematuria.  Musculoskeletal:  Negative for back pain, gait problem, joint swelling and neck pain.  Skin:  Negative for rash.  Neurological:  Negative for dizziness, tremors, speech difficulty and weakness.  Hematological:  Does not bruise/bleed easily.  Psychiatric/Behavioral:  Negative for agitation, dysphoric mood and sleep disturbance. The patient is not nervous/anxious.     Objective:  BP 116/84   Pulse (!) 57   Temp 97.8 F (36.6 C)   Ht 5' 7 (1.702 m)   Wt 157 lb 12.8 oz (71.6 kg)   SpO2 93%   BMI 24.71 kg/m   BP Readings from Last 3 Encounters:  07/09/24 116/84  02/28/24 (!) 112/48  01/24/24 127/60    Wt Readings from Last 3 Encounters:  07/09/24 157 lb 12.8 oz (71.6 kg)  02/28/24 154 lb 9.6 oz (70.1 kg)  02/17/24 166 lb (75.3 kg)    Physical Exam Constitutional:      General: He is not in acute distress.    Appearance: Normal appearance. He is well-developed.     Comments: NAD  Eyes:     Conjunctiva/sclera: Conjunctivae normal.     Pupils: Pupils are equal, round, and reactive to light.  Neck:     Thyroid : No thyromegaly.     Vascular: No JVD.  Cardiovascular:     Rate and Rhythm: Normal rate and regular rhythm.     Heart sounds: Normal heart sounds. No murmur heard.    No friction rub. No gallop.  Pulmonary:     Effort: Pulmonary effort is  normal. No respiratory distress.     Breath sounds: Normal breath sounds. No wheezing or rales.  Chest:     Chest wall: No tenderness.  Abdominal:     General: Bowel sounds are normal. There is no distension.     Palpations: Abdomen is soft. There is no mass.     Tenderness: There is no abdominal tenderness. There is no guarding or rebound.  Musculoskeletal:        General: No tenderness. Normal range of motion.     Cervical back: Normal range of motion.  Lymphadenopathy:     Cervical: No cervical adenopathy.  Skin:    General: Skin is warm and dry.     Findings: No rash.  Neurological:     Mental Status: He is alert and oriented to person, place, and time.     Cranial Nerves: No cranial nerve deficit.     Motor: No abnormal muscle tone.     Coordination: Coordination normal.     Gait: Gait normal.     Deep Tendon Reflexes: Reflexes are normal and symmetric.  Psychiatric:        Behavior: Behavior normal.        Thought Content: Thought content normal.        Judgment: Judgment normal.   Rectal - per Urology   Lab Results  Component Value Date   WBC 5.8 01/24/2024   HGB 14.2 01/24/2024   HCT 43.2 01/24/2024   PLT 114.0 (L) 01/24/2024   GLUCOSE 111 (H) 01/24/2024   CHOL 117 08/04/2022   TRIG 66.0 08/04/2022   HDL 43.40 08/04/2022   LDLCALC 60 08/04/2022   ALT 21 01/24/2024   AST 26 01/24/2024   NA 138 01/24/2024   K 4.0 01/24/2024   CL 103 01/24/2024   CREATININE 1.11 01/24/2024   BUN 13 01/24/2024   CO2 28 01/24/2024   TSH 1.69 01/24/2024   PSA 0.70 01/24/2024   INR 1.1 RATIO (H) 05/16/2008   HGBA1C 6.5 01/24/2024   MICROALBUR -0.1 (L) 09/27/2023    ECHOCARDIOGRAM COMPLETE Result Date: 02/16/2024    ECHOCARDIOGRAM REPORT   Patient Name:   Joshua Brown Date of Exam: 02/16/2024 Medical Rec #:  [id number]     Height:       66.0 in Accession #:    [id number]    Weight:       153.0 lb Date of Birth:  1946-01-10     BSA:          1.785 m Patient Age:    13 years  BP:           110/60 mmHg Patient Gender: M             HR:           67 bpm. Exam Location:  [address] Procedure: 2D Echo, Cardiac Doppler, Color Doppler, 3D Echo and Strain Analysis            (Both Spectral and Color Flow Doppler were utilized during            procedure). Indications:    I25.10 CAD  History:        Patient has prior history of Echocardiogram examinations, most                 recent 08/19/2021. CAD and Previous Myocardial Infarction, Prior                 CABG, Signs/Symptoms:Murmur; Risk Factors:Hypertension,                 Dyslipidemia, Former Smoker and Family History of Coronary                 Artery Disease.  Sonographer:    Elsie Bohr RDCS Referring Phys: [phone] MICHAEL COOPER IMPRESSIONS  1. Left ventricular ejection fraction, by estimation, is 60 to 65%. The left ventricle has normal function. The left ventricle has no regional wall motion abnormalities. There is mild concentric left ventricular hypertrophy with moderate focal hypertrophy of the basilar septum. Left ventricular diastolic parameters are consistent with Grade II diastolic dysfunction (pseudonormalization). The average left ventricular global longitudinal strain is -18.7 %. The global longitudinal strain is normal.  2. Right ventricular systolic function is normal. The right ventricular size is normal.  3. Left atrial size was moderately dilated.  4. The mitral valve is normal in structure. Mild mitral valve regurgitation. No evidence of mitral stenosis.  5. The aortic valve is tricuspid. There is moderate calcification of the aortic valve. Aortic valve regurgitation is not visualized. Mild aortic valve stenosis. Aortic valve area, by VTI measures 1.55 cm. Aortic valve mean gradient measures 10.0 mmHg. Aortic valve Vmax measures 2.18 m/s.  6. The inferior vena cava is normal in size with greater than 50% respiratory variability, suggesting right atrial pressure of 3 mmHg. FINDINGS  Left Ventricle: Left ventricular  ejection fraction, by estimation, is 60 to 65%. The left ventricle has normal function. The left ventricle has no regional wall motion abnormalities. The average left ventricular global longitudinal strain is -18.7 %. Strain was performed and the global longitudinal strain is normal. The left ventricular internal cavity size was normal in size. There is mild concentric left ventricular hypertrophy. Left ventricular diastolic parameters are consistent with Grade II diastolic dysfunction (pseudonormalization). Right Ventricle: The right ventricular size is normal. No increase in right ventricular wall thickness. Right ventricular systolic function is normal. Left Atrium: Left atrial size was moderately dilated. Right Atrium: Right atrial size was normal in size. Pericardium: There is no evidence of pericardial effusion. Mitral Valve: The mitral valve is normal in structure. Mild mitral annular calcification. Mild mitral valve regurgitation. No evidence of mitral valve stenosis. Tricuspid Valve: The tricuspid valve is normal in structure. Tricuspid valve regurgitation is mild . No evidence of tricuspid stenosis. Aortic Valve: The aortic valve is tricuspid. There is moderate calcification of the aortic valve. Aortic valve regurgitation is not visualized. Mild aortic stenosis is present. Aortic valve mean gradient measures 10.0 mmHg. Aortic valve peak gradient measures 19.1 mmHg.  Aortic valve area, by VTI measures 1.55 cm. Pulmonic Valve: The pulmonic valve was normal in structure. Pulmonic valve regurgitation is trivial. No evidence of pulmonic stenosis. Aorta: The aortic root is normal in size and structure. Venous: The inferior vena cava is normal in size with greater than 50% respiratory variability, suggesting right atrial pressure of 3 mmHg. IAS/Shunts: No atrial level shunt detected by color flow Doppler. Additional Comments: 3D was performed not requiring image post processing on an independent workstation and  was indeterminate.  LEFT VENTRICLE PLAX 2D LVIDd:         3.50 cm   Diastology LVIDs:         2.30 cm   LV e' medial:    6.42 cm/s LV PW:         1.30 cm   LV E/e' medial:  18.4 LV IVS:        1.60 cm   LV e' lateral:   11.70 cm/s LVOT diam:     2.00 cm   LV E/e' lateral: 10.1 LV SV:         75 LV SV Index:   42        2D Longitudinal Strain LVOT Area:     3.14 cm  2D Strain GLS (A4C):   -18.0 %                          2D Strain GLS (A3C):   -21.2 %                          2D Strain GLS (A2C):   -16.8 %                          2D Strain GLS Avg:     -18.7 %                           3D Volume EF:                          3D EF:        55 %                          LV EDV:       116 ml                          LV ESV:       52 ml                          LV SV:        63 ml RIGHT VENTRICLE             IVC RV S prime:     11.60 cm/s  IVC diam: 1.20 cm TAPSE (M-mode): 1.9 cm RVSP:           26.4 mmHg LEFT ATRIUM             Index        RIGHT ATRIUM           Index LA diam:        4.00 cm 2.24 cm/m   RA Pressure: 3.00 mmHg LA Vol (A2C):  53.2 ml 29.81 ml/m  RA Area:     11.70 cm LA Vol (A4C):   67.4 ml 37.77 ml/m  RA Volume:   25.90 ml  14.51 ml/m LA Biplane Vol: 62.5 ml 35.02 ml/m  AORTIC VALVE AV Area (Vmax):    1.50 cm AV Area (Vmean):   1.45 cm AV Area (VTI):     1.55 cm AV Vmax:           218.50 cm/s AV Vmean:          148.500 cm/s AV VTI:            0.487 m AV Peak Grad:      19.1 mmHg AV Mean Grad:      10.0 mmHg LVOT Vmax:         104.00 cm/s LVOT Vmean:        68.500 cm/s LVOT VTI:          0.240 m LVOT/AV VTI ratio: 0.49  AORTA Ao Root diam: 3.00 cm Ao Asc diam:  3.30 cm MITRAL VALVE                TRICUSPID VALVE MV Area (PHT): 2.78 cm     TR Peak grad:   23.4 mmHg MV Decel Time: 273 msec     TR Vmax:        242.00 cm/s MV E velocity: 118.00 cm/s  Estimated RAP:  3.00 mmHg MV A velocity: 79.10 cm/s   RVSP:           26.4 mmHg MV E/A ratio:  1.49                             SHUNTS                              Systemic VTI:  0.24 m                             Systemic Diam: 2.00 cm Toribio Fuel MD Electronically signed by Toribio Fuel MD Signature Date/Time: 02/16/2024/8:58:23 PM    Final     Assessment & Plan:   Problem List Items Addressed This Visit     Dyslipidemia   Cont on Zetia , Lipitor, Niacin      Essential hypertension   Cont on Losartan , Toprol , Farxiga       CAD (coronary artery disease)   Cont on Lipitor, Plavix , Losartan , Toprol , Zetia  Niacin      Well adult exam - Primary    We discussed age appropriate health related issues, including available/recomended screening tests and vaccinations. Labs were ordered to be later reviewed . All questions were answered. We discussed one or more of the following - seat belt use, use of sunscreen/sun exposure exercise, fall risk reduction, second hand smoke exposure, firearm use and storage, seat belt use, a need for adhering to healthy diet and exercise. Labs were ordered.  All questions were answered. Rectal - per Urology  - Dr Renda      Relevant Orders   TSH   Urinalysis   CBC with Differential/Platelet   Lipid panel   PSA   Comprehensive metabolic panel with GFR   Microalbumin / creatinine urine ratio   Hemoglobin A1c   Bladder neck obstruction   On Farxiga  (urinary frequency was there before Farxiga )  Relevant Orders   PSA   Diabetes mellitus type 2 in nonobese (HCC)   On Farxiga  (urinary frequency was there before Farxiga )      Relevant Orders   PSA   Microalbumin / creatinine urine ratio      Meds ordered this encounter  Medications   mirabegron  ER (MYRBETRIQ ) 50 MG TB24 tablet    Sig: Take 1 tablet (50 mg total) by mouth daily.    Dispense:  30 tablet    Refill:  11      Follow-up: Return in about 3 months (around 10/07/2024) for a follow-up visit.  Marolyn Noel, MD "

## 2024-07-09 NOTE — Assessment & Plan Note (Signed)
Cont on Zetia, Lipitor, Niacin

## 2024-07-09 NOTE — Assessment & Plan Note (Addendum)
" °  We discussed age appropriate health related issues, including available/recomended screening tests and vaccinations. Labs were ordered to be later reviewed . All questions were answered. We discussed one or more of the following - seat belt use, use of sunscreen/sun exposure exercise, fall risk reduction, second hand smoke exposure, firearm use and storage, seat belt use, a need for adhering to healthy diet and exercise. Labs were ordered.  All questions were answered. Rectal - per Urology  - Dr Renda "

## 2024-07-10 ENCOUNTER — Ambulatory Visit: Payer: Self-pay | Admitting: Internal Medicine

## 2024-07-31 ENCOUNTER — Other Ambulatory Visit: Payer: Self-pay | Admitting: Internal Medicine

## 2024-07-31 ENCOUNTER — Other Ambulatory Visit: Payer: Self-pay | Admitting: Family

## 2024-07-31 NOTE — Telephone Encounter (Unsigned)
 Copied from CRM #[id number]. Topic: Clinical - Medication Refill >> Jul 31, 2024  2:54 PM Aisha D wrote: Medication: ALPRAZolam  (XANAX ) 0.5 MG tablet   Has the patient contacted their pharmacy? Yes (Agent: If no, request that the patient contact the pharmacy for the refill. If patient does not wish to contact the pharmacy document the reason why and proceed with request.) (Agent: If yes, when and what did the pharmacy advise?)  This is the patient's preferred pharmacy:   [facility] [address], [place] - [address] [place] [address] Phone: [phone] Fax: [phone]  Is this the correct pharmacy for this prescription? Yes If no, delete pharmacy and type the correct one.   Has the prescription been filled recently? No  Is the patient out of the medication? No  Has the patient been seen for an appointment in the last year OR does the patient have an upcoming appointment? Yes  Can we respond through MyChart? Yes  Agent: Please be advised that Rx refills may take up to 3 business days. We ask that you follow-up with your pharmacy.

## 2024-08-01 MED ORDER — ALPRAZOLAM 0.5 MG PO TABS: 0.5000 mg | ORAL_TABLET | Freq: Every evening | ORAL | 3 refills | Status: AC | PRN

## 2024-10-08 ENCOUNTER — Ambulatory Visit: Admitting: Internal Medicine

## 2025-02-20 ENCOUNTER — Ambulatory Visit
